# Patient Record
Sex: Female | Born: 1960
Health system: Southern US, Community
[De-identification: ages and names within clinical notes are randomized; demographics above are authoritative.]

## PROBLEM LIST (undated history)

## (undated) DIAGNOSIS — G4730 Sleep apnea, unspecified: Secondary | ICD-10-CM

## (undated) DIAGNOSIS — M545 Low back pain, unspecified: Secondary | ICD-10-CM

## (undated) DIAGNOSIS — E785 Hyperlipidemia, unspecified: Secondary | ICD-10-CM

## (undated) DIAGNOSIS — J45909 Unspecified asthma, uncomplicated: Secondary | ICD-10-CM

## (undated) DIAGNOSIS — R519 Headache, unspecified: Secondary | ICD-10-CM

## (undated) DIAGNOSIS — R011 Cardiac murmur, unspecified: Secondary | ICD-10-CM

## (undated) DIAGNOSIS — R51 Headache: Secondary | ICD-10-CM

## (undated) DIAGNOSIS — R0683 Snoring: Secondary | ICD-10-CM

## (undated) DIAGNOSIS — D509 Iron deficiency anemia, unspecified: Secondary | ICD-10-CM

## (undated) DIAGNOSIS — F419 Anxiety disorder, unspecified: Secondary | ICD-10-CM

## (undated) DIAGNOSIS — I341 Nonrheumatic mitral (valve) prolapse: Secondary | ICD-10-CM

## (undated) HISTORY — DX: Unspecified asthma, uncomplicated: J45.909

## (undated) HISTORY — DX: Low back pain: M54.5

## (undated) HISTORY — DX: Snoring: R06.83

## (undated) HISTORY — DX: Low back pain, unspecified: M54.50

## (undated) HISTORY — DX: Iron deficiency anemia, unspecified: D50.9

## (undated) HISTORY — DX: Headache, unspecified: R51.9

## (undated) HISTORY — DX: Headache: R51

## (undated) HISTORY — DX: Anxiety disorder, unspecified: F41.9

## (undated) HISTORY — DX: Nonrheumatic mitral (valve) prolapse: I34.1

## (undated) HISTORY — DX: Hyperlipidemia, unspecified: E78.5

---

## 1994-05-30 HISTORY — PX: TUBAL LIGATION: SHX77

## 1998-12-27 ENCOUNTER — Emergency Department (HOSPITAL_COMMUNITY): Admission: EM | Admit: 1998-12-27 | Discharge: 1998-12-27 | Payer: Self-pay | Admitting: *Deleted

## 1998-12-27 ENCOUNTER — Encounter: Payer: Self-pay | Admitting: *Deleted

## 2000-09-02 ENCOUNTER — Encounter: Payer: Self-pay | Admitting: Obstetrics and Gynecology

## 2000-09-02 ENCOUNTER — Encounter: Admission: RE | Admit: 2000-09-02 | Discharge: 2000-09-02 | Payer: Self-pay | Admitting: Obstetrics and Gynecology

## 2000-09-05 ENCOUNTER — Encounter: Payer: Self-pay | Admitting: Obstetrics and Gynecology

## 2000-09-05 ENCOUNTER — Encounter: Admission: RE | Admit: 2000-09-05 | Discharge: 2000-09-05 | Payer: Self-pay | Admitting: Obstetrics and Gynecology

## 2002-06-17 HISTORY — PX: LUMBAR DISC SURGERY: SHX700

## 2002-07-01 ENCOUNTER — Ambulatory Visit (HOSPITAL_BASED_OUTPATIENT_CLINIC_OR_DEPARTMENT_OTHER): Admission: RE | Admit: 2002-07-01 | Discharge: 2002-07-01 | Payer: Self-pay | Admitting: Obstetrics and Gynecology

## 2002-12-01 ENCOUNTER — Encounter: Payer: Self-pay | Admitting: Neurosurgery

## 2002-12-02 ENCOUNTER — Inpatient Hospital Stay (HOSPITAL_COMMUNITY): Admission: RE | Admit: 2002-12-02 | Discharge: 2002-12-02 | Payer: Self-pay | Admitting: Neurosurgery

## 2003-03-22 ENCOUNTER — Encounter: Payer: Self-pay | Admitting: Obstetrics and Gynecology

## 2003-03-22 ENCOUNTER — Encounter: Admission: RE | Admit: 2003-03-22 | Discharge: 2003-03-22 | Payer: Self-pay | Admitting: Obstetrics and Gynecology

## 2004-05-02 ENCOUNTER — Encounter: Admission: RE | Admit: 2004-05-02 | Discharge: 2004-05-02 | Payer: Self-pay | Admitting: Obstetrics and Gynecology

## 2004-07-09 ENCOUNTER — Ambulatory Visit: Payer: Self-pay | Admitting: Pulmonary Disease

## 2004-07-17 ENCOUNTER — Ambulatory Visit: Payer: Self-pay | Admitting: Pulmonary Disease

## 2004-08-13 ENCOUNTER — Ambulatory Visit: Payer: Self-pay | Admitting: Pulmonary Disease

## 2004-10-11 ENCOUNTER — Emergency Department (HOSPITAL_COMMUNITY): Admission: EM | Admit: 2004-10-11 | Discharge: 2004-10-11 | Payer: Self-pay | Admitting: Emergency Medicine

## 2005-01-30 ENCOUNTER — Ambulatory Visit: Payer: Self-pay | Admitting: Internal Medicine

## 2005-06-06 ENCOUNTER — Encounter: Admission: RE | Admit: 2005-06-06 | Discharge: 2005-06-06 | Payer: Self-pay | Admitting: Obstetrics and Gynecology

## 2005-07-30 ENCOUNTER — Ambulatory Visit (HOSPITAL_COMMUNITY): Admission: RE | Admit: 2005-07-30 | Discharge: 2005-07-30 | Payer: Self-pay | Admitting: Pulmonary Disease

## 2005-07-30 ENCOUNTER — Ambulatory Visit: Payer: Self-pay | Admitting: Pulmonary Disease

## 2006-04-14 ENCOUNTER — Ambulatory Visit: Payer: Self-pay | Admitting: Pulmonary Disease

## 2006-05-16 ENCOUNTER — Ambulatory Visit: Payer: Self-pay | Admitting: Pulmonary Disease

## 2006-07-15 ENCOUNTER — Encounter: Admission: RE | Admit: 2006-07-15 | Discharge: 2006-07-15 | Payer: Self-pay | Admitting: Obstetrics and Gynecology

## 2007-05-25 ENCOUNTER — Telehealth (INDEPENDENT_AMBULATORY_CARE_PROVIDER_SITE_OTHER): Payer: Self-pay | Admitting: *Deleted

## 2007-06-24 DIAGNOSIS — M545 Low back pain: Secondary | ICD-10-CM

## 2007-06-24 DIAGNOSIS — E785 Hyperlipidemia, unspecified: Secondary | ICD-10-CM

## 2007-06-24 DIAGNOSIS — I059 Rheumatic mitral valve disease, unspecified: Secondary | ICD-10-CM | POA: Insufficient documentation

## 2007-06-24 DIAGNOSIS — F411 Generalized anxiety disorder: Secondary | ICD-10-CM

## 2007-06-25 ENCOUNTER — Ambulatory Visit: Payer: Self-pay | Admitting: Pulmonary Disease

## 2007-06-25 DIAGNOSIS — R0609 Other forms of dyspnea: Secondary | ICD-10-CM

## 2007-06-25 DIAGNOSIS — H9190 Unspecified hearing loss, unspecified ear: Secondary | ICD-10-CM | POA: Insufficient documentation

## 2007-06-25 DIAGNOSIS — Z862 Personal history of diseases of the blood and blood-forming organs and certain disorders involving the immune mechanism: Secondary | ICD-10-CM | POA: Insufficient documentation

## 2007-06-25 DIAGNOSIS — R0989 Other specified symptoms and signs involving the circulatory and respiratory systems: Secondary | ICD-10-CM

## 2007-06-25 DIAGNOSIS — J309 Allergic rhinitis, unspecified: Secondary | ICD-10-CM | POA: Insufficient documentation

## 2007-06-29 ENCOUNTER — Telehealth (INDEPENDENT_AMBULATORY_CARE_PROVIDER_SITE_OTHER): Payer: Self-pay | Admitting: *Deleted

## 2007-08-06 ENCOUNTER — Encounter: Payer: Self-pay | Admitting: Pulmonary Disease

## 2008-01-08 ENCOUNTER — Ambulatory Visit: Payer: Self-pay | Admitting: Pulmonary Disease

## 2008-01-14 ENCOUNTER — Telehealth: Payer: Self-pay | Admitting: Pulmonary Disease

## 2008-01-15 LAB — CONVERTED CEMR LAB
ALT: 16 units/L (ref 0–35)
AST: 19 units/L (ref 0–37)
Albumin: 4.2 g/dL (ref 3.5–5.2)
Alkaline Phosphatase: 54 units/L (ref 39–117)
Basophils Relative: 1.4 % (ref 0.0–3.0)
Bilirubin, Direct: 0.1 mg/dL (ref 0.0–0.3)
CO2: 28 meq/L (ref 19–32)
Chloride: 105 meq/L (ref 96–112)
Cholesterol: 163 mg/dL (ref 0–200)
Creatinine, Ser: 0.8 mg/dL (ref 0.4–1.2)
Direct LDL: 98.5 mg/dL
GFR calc Af Amer: 99 mL/min
Glucose, Bld: 93 mg/dL (ref 70–99)
HCT: 41.1 % (ref 36.0–46.0)
HDL: 39.6 mg/dL (ref >39.0)
Hemoglobin: 14.2 g/dL (ref 12.0–15.0)
Lymphocytes Relative: 29.9 % (ref 12.0–46.0)
MCHC: 34.5 g/dL (ref 30.0–36.0)
MCV: 93 fL (ref 78.0–100.0)
Monocytes Absolute: 0.3 10*3/uL (ref 0.1–1.0)
Monocytes Relative: 5.7 % (ref 3.0–12.0)
Neutrophils Relative %: 59.4 % (ref 43.0–77.0)
Platelets: 331 10*3/uL (ref 150–400)
RBC: 4.42 M/uL (ref 3.87–5.11)
RDW: 11.7 % (ref 11.5–14.6)
Total CHOL/HDL Ratio: 4.1
Total Protein: 7 g/dL (ref 6.0–8.3)
Triglycerides: 211 mg/dL (ref 0–149)
WBC: 5.7 10*3/uL (ref 4.5–10.5)

## 2008-03-01 ENCOUNTER — Telehealth (INDEPENDENT_AMBULATORY_CARE_PROVIDER_SITE_OTHER): Payer: Self-pay | Admitting: *Deleted

## 2008-03-25 ENCOUNTER — Encounter: Admission: RE | Admit: 2008-03-25 | Discharge: 2008-03-25 | Payer: Self-pay | Admitting: Obstetrics and Gynecology

## 2008-03-31 ENCOUNTER — Ambulatory Visit: Payer: Self-pay | Admitting: Pulmonary Disease

## 2008-04-25 ENCOUNTER — Telehealth (INDEPENDENT_AMBULATORY_CARE_PROVIDER_SITE_OTHER): Payer: Self-pay | Admitting: *Deleted

## 2009-05-19 ENCOUNTER — Ambulatory Visit: Payer: Self-pay | Admitting: Pulmonary Disease

## 2009-05-20 LAB — CONVERTED CEMR LAB
ALT: 21 units/L (ref 0–35)
AST: 21 units/L (ref 0–37)
BUN: 7 mg/dL (ref 6–23)
Basophils Absolute: 0.1 10*3/uL (ref 0.0–0.1)
Cholesterol: 145 mg/dL (ref 0–200)
Creatinine, Ser: 0.8 mg/dL (ref 0.4–1.2)
GFR calc non Af Amer: 81.03 mL/min (ref >60)
HCT: 42.8 % (ref 36.0–46.0)
LDL Cholesterol: 70 mg/dL (ref 0–99)
Lymphocytes Relative: 23.3 % (ref 12.0–46.0)
MCHC: 34.1 g/dL (ref 30.0–36.0)
MCV: 96.1 fL (ref 78.0–100.0)
Monocytes Absolute: 0.4 10*3/uL (ref 0.1–1.0)
Neutro Abs: 5.3 10*3/uL (ref 1.4–7.7)
Platelets: 326 10*3/uL (ref 150.0–400.0)
Potassium: 4.2 meq/L (ref 3.5–5.1)
RDW: 11.2 % — ABNORMAL LOW (ref 11.5–14.6)
Sodium: 141 meq/L (ref 135–145)
Specific Gravity, Urine: 1.01 (ref 1.000–1.030)
TSH: 3.17 microintl units/mL (ref 0.35–5.50)
Total Bilirubin: 0.6 mg/dL (ref 0.3–1.2)
Total Protein: 6.9 g/dL (ref 6.0–8.3)
Urine Glucose: NEGATIVE mg/dL
Urobilinogen, UA: 0.2 (ref 0.0–1.0)
VLDL: 31.8 mg/dL (ref 0.0–40.0)

## 2009-08-08 ENCOUNTER — Encounter: Admission: RE | Admit: 2009-08-08 | Discharge: 2009-08-08 | Payer: Self-pay | Admitting: Obstetrics and Gynecology

## 2009-08-22 ENCOUNTER — Ambulatory Visit (HOSPITAL_COMMUNITY): Admission: RE | Admit: 2009-08-22 | Discharge: 2009-08-22 | Payer: Self-pay | Admitting: Obstetrics and Gynecology

## 2009-09-12 ENCOUNTER — Telehealth (INDEPENDENT_AMBULATORY_CARE_PROVIDER_SITE_OTHER): Payer: Self-pay | Admitting: *Deleted

## 2009-10-30 ENCOUNTER — Ambulatory Visit: Payer: Self-pay | Admitting: Pulmonary Disease

## 2009-10-30 DIAGNOSIS — J069 Acute upper respiratory infection, unspecified: Secondary | ICD-10-CM | POA: Insufficient documentation

## 2009-11-01 ENCOUNTER — Telehealth (INDEPENDENT_AMBULATORY_CARE_PROVIDER_SITE_OTHER): Payer: Self-pay | Admitting: *Deleted

## 2010-07-15 LAB — CONVERTED CEMR LAB
ALT: 21 units/L (ref 0–35)
AST: 22 units/L (ref 0–37)
Albumin: 4.2 g/dL (ref 3.5–5.2)
BUN: 11 mg/dL (ref 6–23)
Basophils Absolute: 0 10*3/uL (ref 0.0–0.1)
Basophils Relative: 0.1 % (ref 0.0–1.0)
Bilirubin Urine: NEGATIVE
CO2: 28 meq/L (ref 19–32)
Chloride: 100 meq/L (ref 96–112)
Creatinine, Ser: 0.8 mg/dL (ref 0.4–1.2)
Eosinophils Absolute: 0.3 10*3/uL (ref 0.0–0.6)
Eosinophils Relative: 3.5 % (ref 0.0–5.0)
GFR calc Af Amer: 99 mL/min
Hemoglobin: 14.4 g/dL (ref 12.0–15.0)
Leukocytes, UA: NEGATIVE
Monocytes Absolute: 0.3 10*3/uL (ref 0.2–0.7)
Monocytes Relative: 3.3 % (ref 3.0–11.0)
Mucus, UA: NEGATIVE
Neutro Abs: 5.4 10*3/uL (ref 1.4–7.7)
Neutrophils Relative %: 65.8 % (ref 43.0–77.0)
Nitrite: NEGATIVE
Platelets: 305 10*3/uL (ref 150–400)
RBC: 4.48 M/uL (ref 3.87–5.11)
RDW: 11.5 % (ref 11.5–14.6)
Total Bilirubin: 0.8 mg/dL (ref 0.3–1.2)
Total Protein, Urine: NEGATIVE mg/dL
VLDL: 42 mg/dL — ABNORMAL HIGH (ref 0–40)
WBC: 8.2 10*3/uL (ref 4.5–10.5)

## 2010-07-17 NOTE — Progress Notes (Signed)
Summary: talk to dr Kriste Basque  Phone Note Call from Patient Call back at [phone]   Caller: Patient Call For: nadel Summary of Call: pt want to know if Dr Kriste Basque will be primary for her 50 year old son Initial call taken by: Rickard Patience,  September 12, 2009 11:16 AM  Follow-up for Phone Call        please advise. thanks.  Aundra Millet Reynolds LPN  September 12, 2009 11:22 AM    per SN---he will be primary care for this pt---they can schedule an appt for next aval.  thanks Randell Loop CMA  September 12, 2009 11:48 AM     Additional Follow-up for Phone Call Additional follow up Details #1::        Mrs Keeling contacted - Schedulled pt for 11/29/2009 @ 11:00am. Additional Follow-up by: Eugene Gavia,  September 14, 2009 1:24 PM

## 2010-07-17 NOTE — Progress Notes (Signed)
Summary: allegic to codeine  Phone Note Call from Patient   Caller: Patient Call For: tammy parrett Summary of Call: pt vomited twice last night. says she is allergic to codeine. pt has been taking hydromet since monday night. please advise. pt# [phone]   Initial call taken by: Tivis Ringer, CNA,  Nov 01, 2009 3:00 PM  Follow-up for Phone Call        [email or web address] Vernie Murders  Nov 01, 2009 3:09 PM  Pt is having N & V taking Hydromet. Please advise if there is an alternative.Michel Bickers CMA  Nov 02, 2009 11:58 AM   Additional Follow-up for Phone Call Additional follow up Details #1::        hydromet does not have codeine in it is has hydrocodone.  would stop hydromet. Just use mucinex dm  as needed cough/congestion  Additional Follow-up by: Rubye Oaks NP,  Nov 02, 2009 12:16 PM   New Allergies: ! * CODEINE ! * HYDROMET Additional Follow-up for Phone Call Additional follow up Details #2::    pt returned call. call her at [phone]. Tivis Ringer, CNA  Nov 02, 2009 1:04 PM  The pt is awae to stop Hydromet and use Mucinex DM for her cough and congestion. I will update her Allergy list.Lori Excell Seltzer CMA  Nov 02, 2009 1:16 PM  New Allergies: ! * CODEINE ! * HYDROMET

## 2010-07-17 NOTE — Assessment & Plan Note (Signed)
Summary: Acute NP office visit - bronchitis   CC:  increased SOB, wheezing/rattling in chest, prod cough with yellow/white mucus, and f/c/s x5days.  History of Present Illness: 50 year old female with known history of Allergic Rhintis    ~  May 19, 2009:  Brianna Carroll's had a good year- states that Brianna Carroll is resting well, not snoring as much & husb hasn't complained, resting well, & energy good... Brianna Carroll is inquiring about a rec for a plastic surgeon to discuss reduction surg & we gave her DrBarber & DrHolderness numbers...  Oct 30, 2009 --Presents for an acute office visit. Complains of increased SOB, wheezing/rattling in chest, prod cough with yellow/white mucus, f/c/s 1 week. Cough is getting worse. Brianna Carroll feels tired. Using multiple otc cold meds w/ no help. Denies chest pain, dyspnea, orthopnea, hemoptysis, fever, n/v/d, edema, headache.      Medications Prior to Update: 1)  Zyrtec Allergy 10 Mg Tabs (Cetirizine Hcl) 2)  Simvastatin 40 Mg  Tabs (Simvastatin) .Marland Kitchen.. 1 Tab By Mouth At Bedtime 3)  Alprazolam 0.5 Mg Tabs (Alprazolam) .... 1/2 To 1 Tab By Mouth Three Times A Day As Needed For Anxiety  Current Medications (verified): 1)  Simvastatin 40 Mg  Tabs (Simvastatin) .Marland Kitchen.. 1 Tab By Mouth At Bedtime 2)  Alprazolam 0.5 Mg Tabs (Alprazolam) .... 1/2 To 1 Tab By Mouth Three Times A Day As Needed For Anxiety 3)  Birth Control .... Take 1 Tablet By Mouth Once A Day  Allergies (verified): 1)  ! Doxycycline 2)  ! 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  Hx of HEARING LOSS (ICD-389.9) - prev eval by [facility] ENT w/ mild sensorineural hearing loss, likely genetic and amplification recommended...  Hx of SNORING (ICD-786.09) - eval 11/06 by Gayla Medicus for snoring & prob OSA, but pt never had sleep study due to the cost... we revisitied this 1/09 but Brianna Carroll again cancelled the sleep study due to cost...   Hx of MITRAL VALVE PROLAPSE (ICD-424.0) - ? MVP (never had Echo)... Brianna Carroll denies symptoms except for occas palpit...  HYPERLIPIDEMIA (ICD-272.4) - Chol as high as 263 & LDL up to 185 in past...  ~  FLP 11/07 on Vytor10-20 showed TChol 141, TG 124, HDL, 44, LDL 72  ~  FLP 1/09 on Vytor10-20 showed TChol 216, TG 210, HDL 41, LDL 133... rec change to Simva40.  ~  FLP 7/09 on Simva40 showed TChol 163, TG 211, HDL 40, LDL 99  ~  FLP 12/10 on Simva40 showed TChol 145, TG 159, HDL 43, LDL 70  LOW BACK PAIN SYNDROME (ICD-724.2) - hx LBP eval by DrYates in past; second opinion DrNudelman 1999 w/ dx DDD & rx conservatively; eventually needed LLam & microdiscectomy 6/04...  ANXIETY (ICD-300.00) - on ALPRAZOLAM 0.5mg  Prn...  ANEMIA, IRON DEFICIENCY, HX OF (ICD-V12.3) - hx heavy menses and followed by DrMcPhail... prev Rx w/ oral iron supplements, and takes [facility] for menstrual symptoms.  ~  labs 1/09 showed Hg= 14.4  ~  labs 12/10 showed Hg= 14.6  Review of Systems      See HPI  Vital Signs:  Patient profile:   50 year old female Height:      61 inches Weight:      158 pounds BMI:     29.96 O2 Sat:      99 % on Room air Temp:     97.1 degrees F oral Pulse rate:   64 / minute BP sitting:   126 / 76  (left arm) Cuff size:   regular  Vitals Entered By: Boone Master CNA (Oct 30, 2009 3:28 PM)  O2 Flow:  Room air CC: increased SOB, wheezing/rattling in chest, prod cough with yellow/white mucus, f/c/s x5days Is Patient Diabetic? No Comments Medications reviewed with patient Daytime contact number  verified with patient. Boone Master CNA  Oct 30, 2009 3:33 PM     Impression & Recommendations:  Problem # 1:  ALLERGIC RHINITIS (ICD-477.9) Omnicef 300mg  two times a day for 7 days  Mucinex DM two times a day as needed cough/congestion  Increase fluids.  Hydromet 1-2 tsp every 4-6 hrs as needed cough, may make you sleepy.  Please contact office for sooner follow up if symptoms do not improve or worsen  The following medications were removed from the medication list:    Zyrtec Allergy 10 Mg Tabs (Cetirizine hcl)  Medications Added to Medication List This Visit: 1)  Birth Control  .... Take 1 tablet by mouth once a day 2)  Cefdinir 300 Mg Caps (Cefdinir) .Marland Kitchen.. 1 by mouth two times a day 3)  Hydromet 5-1.5 Mg/31ml Syrp (Hydrocodone-homatropine) .Marland Kitchen.. 1-2 tsp every 4-6 hr as needed cough  Complete Medication List: 1)  Simvastatin 40 Mg Tabs (Simvastatin) .Marland Kitchen.. 1 tab by mouth at bedtime 2)  Alprazolam 0.5 Mg Tabs (Alprazolam) .... 1/2 to 1 tab by mouth three times a day as needed for anxiety 3)  Birth Control  .... Take 1 tablet by mouth once a day 4)  Cefdinir 300 Mg Caps (Cefdinir) .Marland Kitchen.. 1 by mouth two times a day 5)  Hydromet 5-1.5 Mg/76ml Syrp (Hydrocodone-homatropine) .Marland Kitchen.. 1-2 tsp every 4-6 hr as needed cough  Other Orders: Est. Patient Level III (16109)  Patient Instructions: 1)  Omnicef 300mg  two times a day for 7 days  2)  Mucinex DM two times a day as needed cough/congestion  3)  Increase fluids.  4)  Hydromet 1-2 tsp every 4-6 hrs as needed cough, may make you sleepy.  5)  Please contact office for sooner follow up if symptoms do not improve or worsen  Prescriptions: HYDROMET 5-1.5 MG/5ML SYRP (HYDROCODONE-HOMATROPINE) 1-2 tsp every 4-6 hr as needed cough  #8 oz x 0   Entered and Authorized by:   Rubye Oaks NP   Signed by:   Gustavo Dispenza NP on 10/30/2009   Method used:   Print then Give to Patient   RxID:   [id number] CEFDINIR 300 MG CAPS (CEFDINIR) 1 by mouth  two times a day  #14 x 0   Entered and Authorized by:   Rubye Oaks NP   Signed by:   Baneza Bartoszek NP on 10/30/2009   Method used:   Print then Give to Patient   RxID:   [id number]   Appended Document: Orders Update - neb tx    Clinical Lists Changes  Orders: Added new Service order of Nebulizer Tx ([id number]) - Signed

## 2010-08-15 ENCOUNTER — Telehealth: Payer: Self-pay | Admitting: Pulmonary Disease

## 2010-08-16 ENCOUNTER — Other Ambulatory Visit: Payer: Self-pay | Admitting: Obstetrics & Gynecology

## 2010-08-16 DIAGNOSIS — Z1231 Encounter for screening mammogram for malignant neoplasm of breast: Secondary | ICD-10-CM

## 2010-08-23 NOTE — Progress Notes (Signed)
Summary: labs  Phone Note Call from Patient   Caller: Patient Call For: nadel Summary of Call: pt has appt on 3/13. wants labs set up. pt # [id number] Initial call taken by: Tivis Ringer, CNA,  August 15, 2010 11:34 AM  Follow-up for Phone Call        please advise on what labs to order. thanks. Carron Curie CMA  August 15, 2010 12:29 PM   per SN---ok for pt to have labs done prior to ov---lip-cbcd-bmp-hepat-tsh--v70.0----labs are in the computer for 3-12 and pt is aware of appt Randell Loop [facility]  August 15, 2010 2:38 PM

## 2010-08-27 ENCOUNTER — Other Ambulatory Visit: Payer: Self-pay | Admitting: Pulmonary Disease

## 2010-08-27 ENCOUNTER — Other Ambulatory Visit: Payer: BC Managed Care – PPO

## 2010-08-27 ENCOUNTER — Encounter (INDEPENDENT_AMBULATORY_CARE_PROVIDER_SITE_OTHER): Payer: Self-pay | Admitting: *Deleted

## 2010-08-27 DIAGNOSIS — Z0000 Encounter for general adult medical examination without abnormal findings: Secondary | ICD-10-CM

## 2010-08-27 DIAGNOSIS — E785 Hyperlipidemia, unspecified: Secondary | ICD-10-CM

## 2010-08-27 LAB — CBC WITH DIFFERENTIAL/PLATELET
Basophils Absolute: 0 10*3/uL (ref 0.0–0.1)
Hemoglobin: 14.2 g/dL (ref 12.0–15.0)
Lymphocytes Relative: 40.3 % (ref 12.0–46.0)
Lymphs Abs: 2.3 10*3/uL (ref 0.7–4.0)
Monocytes Relative: 6.9 % (ref 3.0–12.0)
Neutro Abs: 2.7 10*3/uL (ref 1.4–7.7)
Neutrophils Relative %: 48 % (ref 43.0–77.0)
RBC: 4.49 Mil/uL (ref 3.87–5.11)
RDW: 13 % (ref 11.5–14.6)
WBC: 5.7 10*3/uL (ref 4.5–10.5)

## 2010-08-27 LAB — HEPATIC FUNCTION PANEL
Alkaline Phosphatase: 48 U/L (ref 39–117)
Bilirubin, Direct: 0.1 mg/dL (ref 0.0–0.3)
Total Bilirubin: 0.5 mg/dL (ref 0.3–1.2)
Total Protein: 6.5 g/dL (ref 6.0–8.3)

## 2010-08-27 LAB — LDL CHOLESTEROL, DIRECT: Direct LDL: 132.7 mg/dL

## 2010-08-27 LAB — LIPID PANEL
Cholesterol: 220 mg/dL — ABNORMAL HIGH (ref 0–200)
HDL: 51.7 mg/dL (ref >39.00)
Total CHOL/HDL Ratio: 4
Triglycerides: 327 mg/dL — ABNORMAL HIGH (ref 0.0–149.0)

## 2010-08-27 LAB — BASIC METABOLIC PANEL
CO2: 27 mEq/L (ref 19–32)
Creatinine, Ser: 0.6 mg/dL (ref 0.4–1.2)
GFR: 106.2 mL/min (ref >60.00)
Sodium: 137 mEq/L (ref 135–145)

## 2010-08-27 LAB — TSH: TSH: 3.73 u[IU]/mL (ref 0.35–5.50)

## 2010-08-28 ENCOUNTER — Encounter: Payer: Self-pay | Admitting: Pulmonary Disease

## 2010-08-28 ENCOUNTER — Ambulatory Visit (INDEPENDENT_AMBULATORY_CARE_PROVIDER_SITE_OTHER)
Admission: RE | Admit: 2010-08-28 | Discharge: 2010-08-28 | Disposition: A | Discharge status: Home / Self Care | Payer: BC Managed Care – PPO | Source: Ambulatory Visit | Attending: Pulmonary Disease | Admitting: Pulmonary Disease

## 2010-08-28 ENCOUNTER — Ambulatory Visit: Payer: Self-pay

## 2010-08-28 ENCOUNTER — Encounter (INDEPENDENT_AMBULATORY_CARE_PROVIDER_SITE_OTHER): Payer: BC Managed Care – PPO | Admitting: Pulmonary Disease

## 2010-08-28 ENCOUNTER — Other Ambulatory Visit: Payer: Self-pay | Admitting: Pulmonary Disease

## 2010-08-28 ENCOUNTER — Ambulatory Visit
Admission: RE | Admit: 2010-08-28 | Discharge: 2010-08-28 | Disposition: A | Discharge status: Home / Self Care | Payer: BC Managed Care – PPO | Source: Ambulatory Visit | Attending: Obstetrics & Gynecology | Admitting: Obstetrics & Gynecology

## 2010-08-28 DIAGNOSIS — Z0000 Encounter for general adult medical examination without abnormal findings: Secondary | ICD-10-CM

## 2010-08-28 DIAGNOSIS — Z1231 Encounter for screening mammogram for malignant neoplasm of breast: Secondary | ICD-10-CM

## 2010-09-18 NOTE — Assessment & Plan Note (Signed)
Summary: ANNUAL//SH   CC:  Yearly ROV & CPX....  History of Present Illness: 50 y/o WF here for a follow up visit & CPX...    ~  May 19, 2009:  she's had a good year- states that she is resting well, not snoring as much & husb hasn't complained, resting well, & energy good... she is inquiring about a rec for a plastic surgeon to discuss reduction surg & we gave her DrBarber & DrHolderness numbers...   ~  August 28, 2010:  Bernis has had a good yr>  notes some recent allergy symptoms but otherw stable... she has mild hearing loss, hx snoring but has declined sleep study, weight is up 8# & she is committed to better diet 7 exercise program... she needs a screening colon & will consult GI at her convenience...  Lipid Profile numbers are not at goal but she doesn't want meds & will work w/ her husb to get on the proper diet, incr exerc & get wt down...    Current Problems:   ALLERGIC RHINITIS (ICD-477.9) - hx +allergy testing to dust, uses OTC antihist etc...  Hx of HEARING LOSS (ICD-389.9) - prev eval by [facility] ENT w/ mild sensorineural hearing loss, likely genetic and amplification recommended...  Hx of SNORING (ICD-786.09) - eval 11/06 by Gayla Medicus for snoring & prob OSA, but pt never had sleep study due to the cost... we revisitied this 1/09 but she again cancelled the sleep study due to cost...  ~  12/10: she notes that she is snoring less, husb not complaining, resting well, & energy is good.  Hx of MITRAL VALVE PROLAPSE (ICD-424.0) - ? MVP (never had Echo)... she denies symptoms except for occas palpit...  HYPERLIPIDEMIA (ICD-272.4) - Chol as high as 263 & LDL up to 185 in past...  ~  FLP 11/07 on Vytor10-20 showed TChol 141, TG 124, HDL, 44, LDL 72  ~  FLP 1/09 on Vytor10-20 showed TChol 216, TG 210, HDL 41, LDL 133... rec change to Simva40.  ~  FLP 7/09 on Simva40 showed TChol 163, TG 211, HDL 40, LDL 99  ~  FLP 12/10 on Simva40 showed TChol 145, TG 159, HDL 43, LDL 70  LOW BACK  PAIN SYNDROME (ICD-724.2) - hx LBP eval by DrYates in past; second opinion DrNudelman 1999 w/ dx DDD & rx conservatively; eventually needed LLam & microdiscectomy 6/04...  ANXIETY (ICD-300.00) - on ALPRAZOLAM 0.5mg  Prn...  ANEMIA, IRON DEFICIENCY, HX OF (ICD-V12.3) - hx heavy menses and followed by DrMcPhail... prev Rx w/ oral iron supplements, and takes [facility] for menstrual symptoms.  ~  labs 1/09 showed Hg= 14.4  ~  labs 12/10 showed Hg= 14.6   Preventive Screening-Counseling & Management  Alcohol-Tobacco     Smoking Status: never  Allergies: 1)  ! Doxycycline 2)  ! Vicodin 3)  ! * Codeine 4)  ! * Hydromet  Comments:  Nurse/Medical Assistant: The patient's medications and allergies were reviewed with the patient and were updated in the Medication and Allergy Lists.  Past History:  Past Medical History: ALLERGIC RHINITIS (ICD-477.9) Hx of HEARING LOSS (ICD-389.9) Hx of SNORING (ICD-786.09) Hx of MITRAL VALVE PROLAPSE (ICD-424.0) HYPERLIPIDEMIA (ICD-272.4) LOW BACK PAIN SYNDROME (ICD-724.2) ANXIETY (ICD-300.00) ANEMIA, IRON DEFICIENCY, HX OF (ICD-V12.3)  Family History: Reviewed history from 06/25/2007 and no changes required. Father died age 50 from lung cancer Mother died age 31 from lung cancer Wyman Songster is her sister +Fam history of hearing loss in sibs  Social History: Reviewed  history from 06/25/2007 and no changes required. 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-smoker Social Etoh  Review of Systems  The patient denies fever, chills, sweats, anorexia, fatigue, weakness, malaise, weight loss, sleep disorder, blurring, diplopia, eye irritation, eye discharge, vision loss, eye pain, photophobia, earache, ear discharge, tinnitus, decreased hearing, nasal congestion, nosebleeds, sore throat, hoarseness, chest pain, palpitations, syncope, dyspnea on exertion, orthopnea, PND, peripheral edema, cough,  dyspnea at rest, excessive sputum, hemoptysis, wheezing, pleurisy, nausea, vomiting, diarrhea, constipation, change in bowel habits, abdominal pain, melena, hematochezia, jaundice, gas/bloating, indigestion/heartburn, dysphagia, odynophagia, dysuria, hematuria, urinary frequency, urinary hesitancy, nocturia, incontinence, back pain, joint pain, joint swelling, muscle cramps, muscle weakness, stiffness, arthritis, sciatica, restless legs, leg pain at night, leg pain with exertion, rash, itching, dryness, suspicious lesions, paralysis, paresthesias, seizures, tremors, vertigo, transient blindness, frequent falls, frequent headaches, difficulty walking, depression, anxiety, memory loss, confusion, cold intolerance, heat intolerance, polydipsia, polyphagia, polyuria, unusual weight change, abnormal bruising, bleeding, enlarged lymph nodes, urticaria, allergic rash, hay fever, and recurrent infections.    Vital Signs:  Patient profile:   50 year old female Height:      61 inches Weight:      166.25 pounds BMI:     31.53 O2 Sat:      98 % on Room air Temp:     98.1 degrees F oral Pulse rate:   77 / minute BP sitting:   122 / 84  (left arm) Cuff size:   regular  Vitals Entered By: Randell Loop CMA (August 28, 2010 4:11 PM)  O2 Sat at Rest %:  98 O2 Flow:  Room air CC: Yearly ROV & CPX... Is Patient Diabetic? No Pain Assessment Patient in pain? no      Comments meds updated today with pt   Physical Exam  Additional Exam:  WD, WN, 50 y/o WF in NAD... GENERAL:  Alert & oriented; pleasant & cooperative. HEENT:  /AT, EOM-full, PERRLA, Fundi-benign, EACs-clear, TMs-wnl, NOSE-clear, THROAT-clear & wnl. NECK:  Supple w/ full ROM; no JVD; normal carotid impulses w/o bruits; no thyromegaly or nodules palpated; no lymphadenopathy. CHEST:  Clear to P & A; without wheezes/ rales/ or rhonchi. HEART:  Regular Rhythm; without murmurs/ rubs/ or gallops. ABDOMEN:  Soft & nontender; normal bowel sounds;  no organomegaly or masses detected. EXT: without deformities or arthritic changes; no varicose veins/ venous insuffic/ or edema. NEURO:  CN's intact; motor testing normal; sensory testing normal; gait normal & balance OK. DERM:  No lesions noted; no rash etc...    Impression & Recommendations:  Problem # 1:  PHYSICAL EXAMINATION (ICD-V70.0)  Orders: 12 Lead EKG (12 Lead EKG) T-2 View CXR (71020TC) Fasting labs done 3/12 & reviewed w/ pt...  Problem # 2:  Hx of SNORING (ICD-786.09) she is committed to losing wt & doesn't want sleep study etc...  Problem # 3:  HYPERLIPIDEMIA (ICD-272.4) FLP prev looked  OK on the simva40 but recently no so much!  She will get on diet & get wt down otherw we will need to change meds... Her updated medication list for this problem includes:    Simvastatin 40 Mg Tabs (Simvastatin) .Marland Kitchen... 1 tab by mouth at bedtime  Problem # 4:  LOW BACK PAIN SYNDROME (ICD-724.2) She is s/p surg by drNudelman & remionded to incr exericse etc...  Problem # 5:  ANXIETY (ICD-300.00) She has Alpraz for as needed use... Her updated medication list for this problem  includes:    Alprazolam 0.5 Mg Tabs (Alprazolam) .Marland Kitchen... 1/2 to 1 tab by mouth three times a day as needed for anxiety  Problem # 6:  ALLERGIC RHINITIS (ICD-477.9) We reviewed the spring time allergy regimen... Her updated medication list for this problem includes:    Fluticasone Propionate 50 Mcg/act Susp (Fluticasone propionate) .Marland Kitchen... 2 sprays in each nostril at bedtime  Complete Medication List: 1)  Simvastatin 40 Mg Tabs (Simvastatin) .Marland Kitchen.. 1 tab by mouth at bedtime 2)  Alprazolam 0.5 Mg Tabs (Alprazolam) .... 1/2 to 1 tab by mouth three times a day as needed for anxiety 3)  Birth Control  .... Take 1 tablet by mouth once a day 4)  Fluticasone Propionate 50 Mcg/act Susp (Fluticasone propionate) .... 2 sprays in each nostril at bedtime  Patient Instructions: 1)  Today we updated your med list- see  below.... 2)  We refilled your meds for 2012... 3)  We discussed diet & exercise> YOU CAN DO IT, get motivated & exercise a little every day... 4)  For your ALLERGIES:  Use the OTC Antihistamine every AM (Allegra, Zyrtek, Claritin), use the MUCINEX 1-2 tabs twice daily w/ lots of water + a nasal saline mist every 1-2H during the day;  and the new Rx for FLONASE at bedtime.Marland KitchenMarland Kitchen 5)  Call for any problems.Marland Kitchen 6)  Please schedule a follow-up appointment in 1 year. Prescriptions: FLUTICASONE PROPIONATE 50 MCG/ACT SUSP (FLUTICASONE PROPIONATE) 2 sprays in each nostril at bedtime  #1 x prn   Entered and Authorized by:   Michele Mcalpine MD   Signed by:   Michele Mcalpine MD on 08/28/2010   Method used:   Print then Give to Patient   RxID:   [id number] ALPRAZOLAM 0.5 MG TABS (ALPRAZOLAM) 1/2 to 1 tab by mouth three times a day as needed for anxiety  #100 x 6   Entered and Authorized by:   Michele Mcalpine MD   Signed by:   Michele Mcalpine MD on 08/28/2010   Method used:   Print then Give to Patient   RxID:   [id number] SIMVASTATIN 40 MG  TABS (SIMVASTATIN) 1 tab by mouth at bedtime  #90 x 4   Entered and Authorized by:   Michele Mcalpine MD   Signed by:   Michele Mcalpine MD on 08/28/2010   Method used:   Print then Give to Patient   RxID:   [id number]    Immunization History:  Influenza Immunization History:    Influenza:  historical (02/26/2010)

## 2010-11-02 NOTE — Op Note (Signed)
   NAME:  Brianna Carroll, Brianna Carroll                         ACCOUNT NO.:  [id number]   MEDICAL RECORD NO.:  [id number]                   PATIENT TYPE:  AMB   LOCATION:  [facility]                                 FACILITY:  [facility]   PHYSICIAN:  Katherine Roan, M.D.               DATE OF BIRTH:  08-11-1960   DATE OF PROCEDURE:  DATE OF DISCHARGE:                                 OPERATIVE REPORT   PREOPERATIVE DIAGNOSIS:  Desires sterilization.   POSTOPERATIVE DIAGNOSIS:  Desires sterilization.   OPERATION PERFORMED:  Laparoscopy with adhesiolysis and laparoscopic tubal  ligation.   PROCEDURE:  The patient was placed in the lithotomy position, examined under  anesthesia.  The uterus was anterior, slightly boggy and enlarged.  The  adnexa negative.  Rectovaginal confirmed.  The patient was then prepped and  draped.  The bladder was emptied.  A transverse incision was made in the  umbilicus.  The needle was inserted into the abdomen. The abdomen was  distended with 3 liters of carbon dioxide.  The trocar was then inserted  into the abdomen and released, and the visualization of the pelvis revealed  a small adhesion to the anterior abdominal wall.  The uterus, tubes, and  ovaries were normal.  There was no evidence of endometriosis.  Both tubes  were mobile, and both ovaries were mobile.  The tubes were then cauterized 2  cm lateral to the uterotubal junction and then divided with scissors.  The  adhesions in the upper abdomen were then lysed with sharp dissection.  I  used bipolar cautery to obtain hemostasis on the adhesion. No unusual blood  loss occurred.  The patient tolerated the procedure well and was sent to the  recovery room in good condition.                                               Katherine Roan, M.D.    SDM/MEDQ  D:  07/01/2002  T:  07/01/2002  Job:  [id number]

## 2010-11-02 NOTE — Op Note (Signed)
NAME:  Brianna Carroll, Brianna Carroll                         ACCOUNT NO.:  [id number]   MEDICAL RECORD NO.:  [id number]                   PATIENT TYPE:  OIB   LOCATION:  2899                                 FACILITY:  [facility]   PHYSICIAN:  Hewitt Shorts, M.D.            DATE OF BIRTH:  09-08-1960   DATE OF PROCEDURE:  12/01/2002  DATE OF DISCHARGE:                                 OPERATIVE REPORT   PREOPERATIVE DIAGNOSIS:  Right L4-5 lumbar disk herniation.   POSTOPERATIVE DIAGNOSIS:  Right L4-5 lumbar disk herniation.   OPERATION PERFORMED:  Right L4-5 lumbar laminotomy and microdiskectomy.   SURGEON:  Hewitt Shorts, M.D.   ASSISTANT:  Payton Doughty, M.D.   ANESTHESIA:  General endotracheal.   INDICATIONS FOR PROCEDURE:  The patient is a 50 year old woman who presented  with lumbar radiculopathy and was found to have a moderately large right L4-  5 lumbar disk herniation.  Decision was made to proceed with elective  laminotomy and microdiskectomy.   DESCRIPTION OF PROCEDURE:  The patient was brought to the operating room and  placed under general endotracheal anesthesia.  The patient was turned to a  prone position.  The lumbar region was prepped with DuraPrep and draped in a  sterile fashion.  The midline was infiltrated with local anesthetic with  epinephrine.  An x-ray was taken.  The L4-5 level identified and a midline  incision made and carried down through the subcutaneous tissue.  Bipolar  cautery and electrocautery were used to maintain hemostasis.  Dissection was  carried down to the lumbar fascia which was incised on the right side of  midline and the paraspinal muscles resected from the spinous process of the  lamina in subperiosteal fashion.  Initial localizing x-rays were taken, the  L4-5 interlaminar space identified.  The operating microscope was draped and  brought into the field to provide additional magnification, illumination and  visualization and the remainder  of the procedure was performed using  microdissection and microsurgical technique.  A laminotomy was performed  using the [facility] Max drill as well as Kerrison punches.  Edges of bone were  waxed as necessary.  The ligamentum flavum was carefully resected leaving  the underlying thecal sac undisturbed.  We identified the thecal sac and  right L5 nerve root and these structures were gently retracted medially  exposing the disk herniation.  There was a fragment of disk within the  spinal canal that was removed.  Then we further opened the annulus and  entered into the disk space.  We proceeded with a thorough diskectomy  removing all loose fragments of disk material from both the disk space and  the epidural space.  There was moderate spondylitic overgrowth going to the  posterior aspects of the L4 and L5 vertebrae which was removed using an  osteophyte removal tool.  Hemostasis was established using bipolar cautery  as well as Gelfoam  soaked in Thrombin.  All the Gelfoam, though, was removed  prior to completing the procedure.  Once the diskectomy was completed, we  did establish hemostasis and then instilled 2mL of fentanyl and 80 mg of  Depo-Medrol into the epidural space and then proceeded with closure.  The  deep fascia was closed with interrupted inverted undyed 1 Vicryl sutures,  the subcutaneous tissue was closed with interrupted inverted 1 undyed Vicryl  sutures and the subcutaneous and subcuticular layer were closed with  interrupted inverted 2-0 and 3-0 undyed Vicryl sutures and the skin edges  were  reapproximated with DermaBond.  The patient tolerated the procedure well.  The estimated blood loss was 50mL.  Sponge, needle and instrument counts  were correct.  Following surgery, the patient was to be turned back to the  supine position, reversed from anesthetic, extubated and transferred to the  recovery room for further care.                                                 Hewitt Shorts, M.D.    RWN/MEDQ  D:  12/01/2002  T:  12/01/2002  Job:  [id number]

## 2010-11-02 NOTE — Assessment & Plan Note (Signed)
[facility]                               PULMONARY OFFICE NOTE   NAME:CROMERSymphony, Brianna Carroll                      MRN:          [id number]  DATE:04/14/2006                            DOB:          25-Aug-1960    HISTORY OF PRESENT ILLNESS:  The patient is a 50 year old white female  patient of Dr. Jodelle Carroll who has a known history of hyperlipidemia, presents  for an acute office visit.  The patient complains that over the last several  weeks that she has noticed a nonpruritic rash underneath her breasts  bilaterally.  The patient denies any fever or redness.  The patient also  complains she has had some increased nasal congestion, postnasal drip, and  drainage.  The patient also had some intermittent episodes over the last  month of having some what she describes as small, painful cysts along her  eyelids.  Her last one was approximately a week ago, and she has used some  over-the-counter sty cream.  The patient denies any chest pain, shortness of  breath, nausea, vomiting, recent travel, antibiotic use.   PAST MEDICAL HISTORY:  Reviewed.   CURRENT MEDICATIONS:  Reviewed.   PHYSICAL EXAMINATION:  GENERAL:  The patient is a pleasant female in no  acute distress.  She is afebrile with stable vital signs.  HEENT:  Nasal mucosa is pale.  Nontender to sinus pressure.  Posterior  pharynx is clear.  TMs normal.  Conjunctivae are noninjected.  Upper and  lower eyelids are clear from redness or cysts or nodules.  NECK:  Supple without cervical adenopathy.  No JVD.  LUNG SOUNDS:  Clear to auscultation bilaterally.  CARDIAC:  Regular rate and rhythm.  ABDOMEN:  Soft without any hepatosplenomegaly.  EXTREMITIES:  Warm without any calf tenderness, cyanosis, clubbing or edema.  BREASTS:  Underneath the bilateral breasts is a small linear plaque of  hyperpigmentation.   IMPRESSION AND PLAN:  1. Allergic rhinitis.  The patient is to use Claritin D as needed along    with saline nasal spray.  The patient will follow up with Dr. Kriste Carroll in      1 month or sooner if needed.  2. Questionable eye discomfort.  I suspect that the patient may have had a      sty.  There is none noted at present time.  The patient is advised to      follow back up with [place] or her ophthalmologist at the onset if these      reoccur.  3. Cutaneous candidiasis underneath the breasts.  The patient is to use      Mycostatin cream twice daily x7 days.  Keep area dry for preventative      measures.  4. Hyperlipidemia.  The patient is due for fasting labs.  We will check a      fasting lipid panel and      a CMET.  The patient will continue on Vytorin along with a low-fat, low-      cholesterol diet and will recheck here in 4-6 weeks with Dr. Kriste Carroll for  a complete physical exam.     ______________________________  Brianna Oaks, NP    ______________________________  Brianna Carroll. Brianna Basque, MD   TP/MedQ  DD: 04/15/2006  DT: 04/15/2006  Job #: [id number]

## 2011-06-25 ENCOUNTER — Other Ambulatory Visit: Payer: Self-pay | Admitting: Pulmonary Disease

## 2011-06-29 ENCOUNTER — Other Ambulatory Visit: Payer: Self-pay | Admitting: Pulmonary Disease

## 2011-07-16 ENCOUNTER — Telehealth: Payer: Self-pay | Admitting: Pulmonary Disease

## 2011-07-16 ENCOUNTER — Ambulatory Visit (INDEPENDENT_AMBULATORY_CARE_PROVIDER_SITE_OTHER): Payer: BC Managed Care – PPO | Admitting: Adult Health

## 2011-07-16 ENCOUNTER — Encounter: Payer: Self-pay | Admitting: Adult Health

## 2011-07-16 VITALS — BP 130/100 | HR 72 | Temp 98.2°F | Ht 61.0 in | Wt 155.0 lb

## 2011-07-16 DIAGNOSIS — J329 Chronic sinusitis, unspecified: Secondary | ICD-10-CM

## 2011-07-16 DIAGNOSIS — J019 Acute sinusitis, unspecified: Secondary | ICD-10-CM | POA: Insufficient documentation

## 2011-07-16 MED ORDER — AMOXICILLIN-POT CLAVULANATE 875-125 MG PO TABS: 1.0000 | ORAL_TABLET | Freq: Two times a day (BID) | ORAL | Status: AC

## 2011-07-16 NOTE — Patient Instructions (Signed)
Augmentin 875mg  Twice daily  For 10 days -take with food Mucinex DM Twice daily  As needed  Cough/congestion  Saline nasal rinses As needed   Please contact office for sooner follow up if symptoms do not improve or worsen or seek emergency care  follow up Dr. Kriste Basque  In 2-3 months for physical.

## 2011-07-16 NOTE — Assessment & Plan Note (Signed)
Acute Sinusitis with Bronchitis  Plan;  Augmentin 875mg  Twice daily  For 10 days -take with food Mucinex DM Twice daily  As needed  Cough/congestion  Saline nasal rinses As needed   Please contact office for sooner follow up if symptoms do not improve or worsen or seek emergency care  follow up Dr. Kriste Basque  In 2-3 months for physical.

## 2011-07-16 NOTE — Progress Notes (Signed)
Subjective:    Patient ID: Brianna Carroll, female    DOB: 09-19-1960, 51 y.o.   MRN: [id number]  HPI 51 y/o WF   ~ May 19, 2009: she's had a good year- states that she is resting well, not snoring as much & husb hasn't complained, resting well, & energy good... she is inquiring about a rec for a plastic surgeon to discuss reduction surg & we gave her DrBarber & DrHolderness numbers...   ~ August 28, 2010: Brianna Carroll has had a good yr> notes some recent allergy symptoms but otherw stable... she has mild hearing loss, hx snoring but has declined sleep study, weight is up 8# & she is committed to better diet 7 exercise program... she needs a screening colon & will consult GI at her convenience... Lipid Profile numbers are not at goal but she doesn't want meds & will work w/ her husb to get on the proper diet, incr exerc & get wt down...   07/16/2011 Acute OV  Presents for 3 weeks of sinus congestion, cough, and drainage. Took Zpack 2 weeks ago with improvemtn. But symptoms came back last week. Husband has had same symptoms. Believes she re-caught it from him.  Has a lot of drainage . Cough is worse at night and early am. Mucus is very thick and sticky.  OTC not helping.,  No fever or chest pain.  No n/v. No recent travel.    PMH:   ALLERGIC RHINITIS (ICD-477.9) - hx +allergy testing to dust, uses OTC antihist etc...   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   Hx of SNORING (ICD-786.09) - eval 11/06 by Gayla Medicus for snoring & prob OSA, but pt never had sleep study due to the cost... we revisitied this 1/09 but she again cancelled the sleep study due to cost...  ~ 12/10: she notes that she is snoring less, husb not complaining, resting well, & energy is good.   Hx of MITRAL VALVE PROLAPSE (ICD-424.0) - ? MVP (never had Echo)... she denies symptoms except for occas palpit...   HYPERLIPIDEMIA  (ICD-272.4) - Chol as high as 263 & LDL up to 185 in past...  ~ FLP 11/07 on Vytor10-20 showed TChol 141, TG 124, HDL, 44, LDL 72  ~ FLP 1/09 on Vytor10-20 showed TChol 216, TG 210, HDL 41, LDL 133... rec change to Simva40.  ~ FLP 7/09 on Simva40 showed TChol 163, TG 211, HDL 40, LDL 99  ~ FLP 12/10 on Simva40 showed TChol 145, TG 159, HDL 43, LDL 70  \ LOW BACK PAIN SYNDROME (ICD-724.2) - hx LBP eval by DrYates in past; second opinion DrNudelman 1999 w/ dx DDD & rx conservatively; eventually needed LLam & microdiscectomy 6/04...   ANXIETY (ICD-300.00) - on ALPRAZOLAM 0.5mg  Prn...   ANEMIA, IRON DEFICIENCY, HX OF (ICD-V12.3) - hx heavy menses and followed by DrMcPhail... prev Rx w/ oral iron supplements, and takes [facility] for menstrual symptoms.  ~ labs 1/09 showed Hg= 14.4  ~ labs 12/10 showed Hg= 14.6     Review of Systems Constitutional:   No  weight loss, night sweats,  Fevers, chills, fatigue, or  lassitude.  HEENT:   No headaches,  Difficulty swallowing,  Tooth/dental problems, or  Sore throat,                No sneezing, itching, ear ache,  +nasal congestion, post nasal drip,   CV:  No chest  pain,  Orthopnea, PND, swelling in lower extremities, anasarca, dizziness, palpitations, syncope.   GI  No heartburn, indigestion, abdominal pain, nausea, vomiting, diarrhea, change in bowel habits, loss of appetite, bloody stools.   Resp: No shortness of breath with exertion or at rest.    No coughing up of blood.    No chest wall deformity  Skin: no rash or lesions.  GU: no dysuria, change in color of urine, no urgency or frequency.  No flank pain, no hematuria   MS:  No joint pain or swelling.  No decreased range of motion.  No back pain.  Psych:  No change in mood or affect. No depression or anxiety.  No memory loss.         Objective:   Physical Exam GEN: A/Ox3; pleasant , NAD, well nourished   HEENT:  [place]/AT,  EACs-clear, TMs-wnl, NOSE-turbinate edema  THROAT-clear, no  lesions, no postnasal drip or exudate noted.   NECK:  Supple w/ fair ROM; no JVD; normal carotid impulses w/o bruits; no thyromegaly or nodules palpated; no lymphadenopathy.  RESP  Coarse BS  w/o, wheezes/ rales/ or rhonchi.no accessory muscle use, no dullness to percussion  CARD:  RRR, no m/r/g  , no peripheral edema, pulses intact, no cyanosis or clubbing.  GI:   Soft & nt; nml bowel sounds; no organomegaly or masses detected.  Musco: Warm bil, no deformities or joint swelling noted.   Neuro: alert, no focal deficits noted.    Skin: Warm, no lesions or rashes         Assessment & Plan:

## 2011-07-16 NOTE — Telephone Encounter (Signed)
Spoke with pt. She states that SN called her zpack in while on call approx 2 wks ago to help with cough/congestion. She states that this has helped, but now cough is back and worse than before- prod with yellow sputum. She states also feels that she may be wheezing- "hears rattle in chest". OV with TP at our [facility] this pm. Address and phone number to that office given to pt and she verbalized understanding.

## 2011-08-19 ENCOUNTER — Telehealth: Payer: Self-pay | Admitting: Pulmonary Disease

## 2011-08-19 DIAGNOSIS — E785 Hyperlipidemia, unspecified: Secondary | ICD-10-CM

## 2011-08-19 DIAGNOSIS — F411 Generalized anxiety disorder: Secondary | ICD-10-CM

## 2011-08-19 DIAGNOSIS — J329 Chronic sinusitis, unspecified: Secondary | ICD-10-CM

## 2011-08-19 DIAGNOSIS — Z862 Personal history of diseases of the blood and blood-forming organs and certain disorders involving the immune mechanism: Secondary | ICD-10-CM

## 2011-08-19 NOTE — Telephone Encounter (Signed)
LMOM for pt TCB 

## 2011-08-19 NOTE — Telephone Encounter (Signed)
Per SN:  Pt can come in on Thursday, March 21 at 2 pm.  Have her come in at 8:00am for fasting labs and return at 2 pm for CPX.  She will need to have fasting lipid panel, bmet, liver, cbc with diff, tsh, UA, vit D level, and CXR in the mornings.  An EKG will be done at 2 pm during CPX.

## 2011-08-19 NOTE — Telephone Encounter (Signed)
Per our records, pt is due for her CPX on or after 08/28/11. Pt states he new schedule is harder to work around and already has her OB/GYN appointment scheduled for 09/05/11 at 9am and wants to come in for labs around 8am and would liked to be worked in after GYN appointment in order to only take 1 sick day from work. However if this is not an option pt is requesting that she can at least come in for fasting labs 09/05/2011 and just return another day in the late PM with SN for CPX. Please advise, thank you.

## 2011-08-20 NOTE — Telephone Encounter (Signed)
Called, spoke with pt.  She would like to come in on Thursday, March 21 at 8am for CXR and fasting labs and come back at 2 pm for CPX with Dr. Kriste Basque.  Labs and CXR orders entered and CPX scheduled -- pt aware and voiced no further questions/concerns at this time.

## 2011-08-20 NOTE — Telephone Encounter (Signed)
Pt return call. Brianna Carroll  °

## 2011-09-05 ENCOUNTER — Ambulatory Visit: Payer: BC Managed Care – PPO | Admitting: Pulmonary Disease

## 2011-09-20 ENCOUNTER — Other Ambulatory Visit: Payer: Self-pay | Admitting: Obstetrics & Gynecology

## 2011-09-20 DIAGNOSIS — Z1231 Encounter for screening mammogram for malignant neoplasm of breast: Secondary | ICD-10-CM

## 2011-09-24 ENCOUNTER — Ambulatory Visit
Admission: RE | Admit: 2011-09-24 | Discharge: 2011-09-24 | Disposition: A | Discharge status: Home / Self Care | Payer: BC Managed Care – PPO | Source: Ambulatory Visit | Attending: Obstetrics & Gynecology | Admitting: Obstetrics & Gynecology

## 2011-09-24 DIAGNOSIS — Z1231 Encounter for screening mammogram for malignant neoplasm of breast: Secondary | ICD-10-CM

## 2011-09-24 LAB — HM MAMMOGRAPHY: HM Mammogram: NORMAL

## 2011-10-03 ENCOUNTER — Encounter: Payer: Self-pay | Admitting: Internal Medicine

## 2011-10-23 ENCOUNTER — Ambulatory Visit (INDEPENDENT_AMBULATORY_CARE_PROVIDER_SITE_OTHER)
Admission: RE | Admit: 2011-10-23 | Discharge: 2011-10-23 | Disposition: A | Discharge status: Home / Self Care | Payer: BC Managed Care – PPO | Source: Ambulatory Visit | Attending: Pulmonary Disease | Admitting: Pulmonary Disease

## 2011-10-23 ENCOUNTER — Ambulatory Visit (INDEPENDENT_AMBULATORY_CARE_PROVIDER_SITE_OTHER): Payer: BC Managed Care – PPO | Admitting: Pulmonary Disease

## 2011-10-23 ENCOUNTER — Encounter: Payer: Self-pay | Admitting: Pulmonary Disease

## 2011-10-23 ENCOUNTER — Other Ambulatory Visit (INDEPENDENT_AMBULATORY_CARE_PROVIDER_SITE_OTHER): Payer: BC Managed Care – PPO

## 2011-10-23 VITALS — BP 130/86 | HR 68 | Temp 97.0°F | Ht 61.0 in | Wt 157.2 lb

## 2011-10-23 DIAGNOSIS — Z0000 Encounter for general adult medical examination without abnormal findings: Secondary | ICD-10-CM

## 2011-10-23 DIAGNOSIS — M545 Low back pain: Secondary | ICD-10-CM

## 2011-10-23 DIAGNOSIS — J309 Allergic rhinitis, unspecified: Secondary | ICD-10-CM

## 2011-10-23 DIAGNOSIS — F411 Generalized anxiety disorder: Secondary | ICD-10-CM

## 2011-10-23 DIAGNOSIS — E785 Hyperlipidemia, unspecified: Secondary | ICD-10-CM

## 2011-10-23 DIAGNOSIS — H9190 Unspecified hearing loss, unspecified ear: Secondary | ICD-10-CM

## 2011-10-23 DIAGNOSIS — Z862 Personal history of diseases of the blood and blood-forming organs and certain disorders involving the immune mechanism: Secondary | ICD-10-CM

## 2011-10-23 LAB — LIPID PANEL
Cholesterol: 171 mg/dL (ref 0–200)
HDL: 51.7 mg/dL (ref >39.00)

## 2011-10-23 LAB — HEPATIC FUNCTION PANEL
ALT: 41 U/L — ABNORMAL HIGH (ref 0–35)
AST: 29 U/L (ref 0–37)
Albumin: 3.5 g/dL (ref 3.5–5.2)

## 2011-10-23 LAB — CBC WITH DIFFERENTIAL/PLATELET
Eosinophils Relative: 3.4 % (ref 0.0–5.0)
Lymphocytes Relative: 30.9 % (ref 12.0–46.0)
Monocytes Relative: 5.2 % (ref 3.0–12.0)
Neutrophils Relative %: 60 % (ref 43.0–77.0)
Platelets: 317 10*3/uL (ref 150.0–400.0)
WBC: 7.4 10*3/uL (ref 4.5–10.5)

## 2011-10-23 LAB — BASIC METABOLIC PANEL
BUN: 11 mg/dL (ref 6–23)
Chloride: 107 mEq/L (ref 96–112)
Creatinine, Ser: 0.9 mg/dL (ref 0.4–1.2)
Glucose, Bld: 76 mg/dL (ref 70–99)
Potassium: 4.2 mEq/L (ref 3.5–5.1)

## 2011-10-23 LAB — URINALYSIS, ROUTINE W REFLEX MICROSCOPIC
Ketones, ur: NEGATIVE
Nitrite: NEGATIVE
Specific Gravity, Urine: <=1.005 (ref 1.000–1.030)
Urobilinogen, UA: 0.2 (ref 0.0–1.0)

## 2011-10-23 LAB — TSH: TSH: 2.8 u[IU]/mL (ref 0.35–5.50)

## 2011-10-23 MED ORDER — SIMVASTATIN 40 MG PO TABS: 40.0000 mg | ORAL_TABLET | Freq: Every day | ORAL | Status: DC

## 2011-10-23 MED ORDER — ALPRAZOLAM 0.5 MG PO TABS: ORAL_TABLET | ORAL | Status: DC

## 2011-10-23 MED ORDER — TRAMADOL HCL 50 MG PO TABS: 50.0000 mg | ORAL_TABLET | Freq: Three times a day (TID) | ORAL | Status: AC | PRN

## 2011-10-23 MED ORDER — FLUTICASONE PROPIONATE 50 MCG/ACT NA SUSP: 2.0000 | Freq: Every day | NASAL | Status: DC

## 2011-10-23 NOTE — Progress Notes (Signed)
Subjective:     Patient ID: Brianna Carroll, female   DOB: Dec 05, 1960, 51 y.o.   MRN: [id number]  HPI 51 y/o WF here for a follow up visit & CPX...   ~  May 19, 2009:  she's had a good year- states that she is resting well, not snoring as much & husb hasn't complained, resting well, & energy good... she is inquiring about a rec for a plastic surgeon to discuss reduction surg & we gave her DrBarber & DrHolderness numbers...  ~  August 28, 2010:  Brianna Carroll has had a good yr>  notes some recent allergy symptoms but otherw stable... she has mild hearing loss, hx snoring but has declined sleep study, weight is up 8# & she is committed to better diet & exercise program... she needs a screening colon & will consult GI at her convenience...  Lipid Profile numbers are not at goal but she doesn't want meds & will work w/ her husb to get on the proper diet, incr exerc & get wt down...  ~  Oct 23, 2011:  16mo ROV & CPX> Brianna Carroll has changed jobs from [employer] at [employer] to a similar position in the [employer]- less politics, nepotism, stress;  We reviewed her Problems, Meds, XRays, & Labs> see below>>    She had f/u eval by her GYN, DrSMiller & is perimenopausal, now on Estrace & Provera along w/ some vaginal cream; they tried Fluoxitine but she stopped it due to vivid dreams... She has had some dysuria & UA shows prob UTI> we discussed treating w/ Cipro & if symptoms don't resolve she will need further eval...    She has had some LBP & has seen DrMurphy/Wainer- Tramadol helps & we will refill; discussed diet, exercises, heat, etc... CXR 5/13 showed normal heart size, clear lungs, NAD... LABS 5/13:  FLP- chol=ok TG=318 on diet alone;  Chems- wnl x SGPT=41;  CBC- wnl;  TSH=2.80;  UA- +wbc/bact & we will rx w/ Cipro..   Problem List:    << PROBLEM LIST UPDATED 10/23/11 >>  ALLERGIC RHINITIS (ICD-477.9) - hx +allergy testing to dust; uses OTC antihist & FLONASE prn... ~  She had sinusitis 1/13  treated by TP w/ Augmentin, Mucinex, Saline & resolved...  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  Hx of SNORING (ICD-786.09) - eval 11/06 by Gayla Medicus for snoring & prob OSA, but pt never had sleep study due to the cost... we revisitied this 1/09 but she again cancelled the sleep study due to cost... ~  12/10: she notes that she is snoring less, husb not complaining, resting well, & energy is good; we reviewed the imperitive of weight loss...  Hx of MITRAL VALVE PROLAPSE (ICD-424.0) - ? MVP (never had Echo)... she denies symptoms except for occas palpit> no CP, dizzy, syncope, SOB, edema, etc... ~  EKG 3/12 showed NSR, rate72, late transition, NAD...  HYPERLIPIDEMIA (ICD-272.4) - Chol as high as 263 & LDL up to 185 in past... ~  FLP 11/07 on Vytor10-20 showed TChol 141, TG 124, HDL, 44, LDL 72 ~  FLP 1/09 on Vytor10-20 showed TChol 216, TG 210, HDL 41, LDL 133... rec change to Simva40. ~  FLP 7/09 on Simva40 showed TChol 163, TG 211, HDL 40, LDL 99 ~  FLP 12/10 on Simva40 showed TChol 145, TG 159, HDL 43, LDL  70 ~  FLP 3/12 on Simva40 (wt=166#) showed TChol 220, TG 327, HDL 52, LDL 133... rec to take med daily + diet, exercise, wt loss... ~  FLP 5/13 on Simva40 (wt=157#) showed TChol 171, TGT 318, HDL 52, LDL 94... Improved Chol, needs better low fat/ get wt down...  GI >> she needs baseline Colonoscopy for screening & she will sched at her convenience...  GYN >> she prev saw DrMcPhail w/ heavy menses, Rx BCPs, Effexor, etc... She now sees DrSMiller & is perimenopausal> started Estrace & Provera, along w/ a vag cream; tried Fluoxitine but INTOL w/ vivid dreams & stopped... ~  Labs 5/13 w/ prob mild UTI & she was given CIPRO 250mg Bid to treat...  LOW BACK PAIN SYNDROME (ICD-724.2) - hx LBP eval by DrYates in past; second opinion DrNudelman 1999 w/ dx DDD & rx  conservatively; eventually needed LLam & microdiscectomy 6/04 by Lenon Oms. ~  5/13:  She reports some "back spasms" that improved after eval by DrMurphy/Wainer w/ TRAMADOL 50mg  Prn (we refilled Rx & discussed exercise program etc)...  ANXIETY (ICD-300.00) - on ALPRAZOLAM 0.5mg  Prn...  ANEMIA, IRON DEFICIENCY, HX OF (ICD-V12.3) - hx heavy menses and prev followed by DrMcPhail> prev Rx w/ oral iron supplements... ~  labs 1/09 showed Hg= 14.4 ~  labs 12/10 showed Hg= 14.6 ~  Labs 3/12 showed Hg= 14.2 ~  Labs 5/13 showed Hg= 14.5   No past surgical history on file.   Outpatient Encounter Prescriptions as of 10/23/2011  Medication Sig Dispense Refill  . ALPRAZolam (XANAX) 0.5 MG tablet TAKE ONE-HALF TO ONE TABLET BY MOUTH THREE TIMES DAILY AS NEEDED FOR ANXIETY  100 tablet  1  . B Complex-C (SUPER B COMPLEX PO) Take by mouth daily.      Marland Kitchen SIMVASTATIN PO Take 40 mg by mouth.      . TRAMADOL HCL PO Take by mouth. Patient unsure of dosage      . UNKNOWN TO PATIENT Take by mouth daily. Oral contraceptive        Allergies  Allergen Reactions  . Codeine     REACTION: nausea and vomiting  . Doxycycline     REACTION: hives  . Hydrocodone-Acetaminophen     REACTION: vomiting  . Hydrocodone-Homatropine     REACTION: nausea and vomiting    Current Medications, Allergies, Past Medical History, Past Surgical History, Family History, and Social History were reviewed in [employer] record.   Review of Systems    The patient denies fever, chills, sweats, anorexia, fatigue, weakness, malaise, weight loss, sleep disorder, blurring, diplopia, eye irritation, eye discharge, vision loss, eye pain, photophobia, earache, ear discharge, tinnitus, decreased hearing, nasal congestion, nosebleeds, sore throat, hoarseness, chest pain, palpitations, syncope, dyspnea on exertion, orthopnea, PND, peripheral edema, cough, dyspnea at rest, excessive sputum, hemoptysis, wheezing, pleurisy,  nausea, vomiting, diarrhea, constipation, change in bowel habits, abdominal pain, melena, hematochezia, jaundice, gas/bloating, indigestion/heartburn, dysphagia, odynophagia, dysuria, hematuria, urinary frequency, urinary hesitancy, nocturia, incontinence, back pain, joint pain, joint swelling, muscle cramps, muscle weakness, stiffness, arthritis, sciatica, restless legs, leg pain at night, leg pain with exertion, rash, itching, dryness, suspicious lesions, paralysis, paresthesias, seizures, tremors, vertigo, transient blindness, frequent falls, frequent headaches, difficulty walking, depression, anxiety, memory loss, confusion, cold intolerance, heat intolerance, polydipsia, polyphagia, polyuria, unusual weight change, abnormal bruising, bleeding, enlarged lymph nodes, urticaria, allergic rash, hay fever, and recurrent infections.     Objective:   Physical Exam     WD, WN, 51 y/o WF in  NAD.Marland KitchenMarland Kitchen GENERAL:  Alert & oriented; pleasant & cooperative. HEENT:  [place]/AT, EOM-full, PERRLA, [place]i-benign, EACs-clear, TMs-wnl, NOSE-clear, THROAT-clear & wnl. NECK:  Supple w/ full ROM; no JVD; normal carotid impulses w/o bruits; no thyromegaly or nodules palpated; no lymphadenopathy. CHEST:  Clear to P & A; without wheezes/ rales/ or rhonchi. HEART:  Regular Rhythm; without murmurs/ rubs/ or gallops. ABDOMEN:  Soft & nontender; normal bowel sounds; no organomegaly or masses detected. EXT: without deformities or arthritic changes; no varicose veins/ venous insuffic/ or edema. NEURO:  CN's intact; motor testing normal; sensory testing normal; gait normal & balance OK. DERM:  No lesions noted; no rash etc...  RADIOLOGY DATA:  Reviewed in the EPIC EMR & discussed w/ the patient...  LABORATORY DATA:  Reviewed in the EPIC EMR & discussed w/ the patient...   Assessment:     CPX>> reminded to get routine screening colonoscopy at her convenience...  ?MVP>  She's never had 2DEcho & remains asymptomatic; no MSC on  exam today; a non-problem yet still advised to avoid caffeine etc...  Hyperlipid>  Improved w/ Simva40 Qhs & better diet, wt down 9# to 157#; TG still too high & we discussed low fat diet & further wt loss...  GYN>  Followed by DrSMiller on hormones now; she has mild UTI & rec to treat w/ Cipro now...  LBP>  She has seen DrMurphy/Wainer & improved w/ Tramadol; we refilled the Rx & discussed exercise, heat, etc...     Plan:     Patient's Medications  New Prescriptions   FLUTICASONE (FLONASE) 50 MCG/ACT NASAL SPRAY    Place 2 sprays into the nose daily.   SIMVASTATIN (ZOCOR) 40 MG TABLET    Take 1 tablet (40 mg total) by mouth at bedtime.   TRAMADOL (ULTRAM) 50 MG TABLET    Take 1 tablet (50 mg total) by mouth 3 (three) times daily as needed for pain.  Previous Medications   ESTRADIOL (ESTRACE) 1 MG TABLET    Take 1 mg by mouth daily.   MEDROXYPROGESTERONE (PROVERA) 5 MG TABLET    Take 1/2 tablet by mouth for the first 15 days of each month  Modified Medications   Modified Medication Previous Medication   ALPRAZOLAM (XANAX) 0.5 MG TABLET ALPRAZolam (XANAX) 0.5 MG tablet      Take 1/2 to 1 tablet by mouth three times daily as needed for anxiety    TAKE ONE-HALF TO ONE TABLET BY MOUTH THREE TIMES DAILY AS NEEDED FOR ANXIETY  Discontinued Medications   B COMPLEX-C (SUPER B COMPLEX PO)    Take by mouth daily.   FLUOXETINE (PROZAC) 10 MG CAPSULE    Take 10 mg by mouth daily.   SIMVASTATIN PO    Take 40 mg by mouth.   TRAMADOL HCL PO    Take by mouth. Patient unsure of dosage   UNKNOWN TO PATIENT    Take by mouth daily. Oral contraceptive

## 2011-10-23 NOTE — Patient Instructions (Signed)
Today we updated your med list in our EPIC system...    Continue your current medications the same...  We refilled your meds per request...  For you Triglycerides:  Get on a good low fat diet & work on weight reduction...    Generally speaking- if your weight is coming down then the TGs are improving...  Call for any questions.Marland KitchenMarland Kitchen

## 2011-10-24 ENCOUNTER — Encounter: Payer: Self-pay | Admitting: Pulmonary Disease

## 2011-10-24 LAB — VITAMIN D 25 HYDROXY (VIT D DEFICIENCY, FRACTURES): Vit D, 25-Hydroxy: 50 ng/mL (ref 30–89)

## 2011-12-06 ENCOUNTER — Encounter: Payer: BC Managed Care – PPO | Admitting: Internal Medicine

## 2012-01-24 LAB — HM COLONOSCOPY

## 2012-04-29 ENCOUNTER — Other Ambulatory Visit: Payer: Self-pay | Admitting: Pulmonary Disease

## 2012-09-09 ENCOUNTER — Telehealth: Payer: Self-pay | Admitting: Obstetrics & Gynecology

## 2012-09-09 NOTE — Telephone Encounter (Signed)
Pt is having problems with her medications. She has been having issues with her skin breaking out for the last two months. She tried waiting it out to see if it cleared up but instead it is getting worse. She is currently taking Estradiol and Progesterone. Progesterone is what she believes is causing the break outs. She only takes it days 1-15 when she doesn't take it her faces clears up some. She is now having a period again and it has been heavier than it used to be. She is also having breast tenderness; the tenderness has been going on for about 1 week. Please advise on if she should refill the progesterone or if she should stop taking it. Pt is worried about all the different symptoms she has been having.

## 2012-09-09 NOTE — Telephone Encounter (Signed)
She needs to be on some type of progesterone.  We discussed this in Aug when she was here.  She wanted to take the estrogen alone which she CANNOT because she has a uterus.  She was on provera and had more symptoms with that.  She probably needs an OV to discuss options.  Please advise her to not take the estrogen alone.

## 2012-09-09 NOTE — Telephone Encounter (Signed)
See note below of patient symptoms. Last aex 09/24/2011. Last OV 02/06/2012. Please advise.  sue

## 2012-09-09 NOTE — Telephone Encounter (Signed)
Chart in your office  

## 2012-09-09 NOTE — Telephone Encounter (Signed)
Patient notified of Dr. Hyacinth Meeker response and instructions. Patient will call and get appt. In the morning to follow up with a provider here. Patient understands to continue with progesterone and estrogen till sees Dr. Hyacinth Meeker or a provider.

## 2012-09-10 ENCOUNTER — Encounter: Payer: Self-pay | Admitting: Obstetrics & Gynecology

## 2012-09-11 ENCOUNTER — Ambulatory Visit (INDEPENDENT_AMBULATORY_CARE_PROVIDER_SITE_OTHER): Payer: BC Managed Care – PPO | Admitting: Gynecology

## 2012-09-11 ENCOUNTER — Encounter: Payer: Self-pay | Admitting: Gynecology

## 2012-09-11 VITALS — BP 90/60 | HR 80 | Resp 18

## 2012-09-11 DIAGNOSIS — Z780 Asymptomatic menopausal state: Secondary | ICD-10-CM

## 2012-09-11 DIAGNOSIS — Z79899 Other long term (current) drug therapy: Secondary | ICD-10-CM

## 2012-09-11 NOTE — Progress Notes (Signed)
Pt presents today to discuss her HRT.  Pt had been on oral contraception for cycle control and was changed to estradiol/progestin after a low AMH.  Because of the ocp, the pt had never been amenorrheic.  She reports a history of cystic acne before oc's associated with her menses as well as mastalgia.  Pt reports doing well on HRT, taking cyclic.  She reports she essentially did not have any bleeding after progestin and overall felt great.  Pt reports this past month, she began to develop cystic acne, small on her face, she noted an increase in vaginal discharge for a few days that was "slick".  She also reports having mastalgia which she still has.  She reports that this month she bled heavier than usual-3d requiring using 1 pad/d and spotting for another 2-3d afterwards.  Pt is concerned. We had a long discussion regarding differences in OCP and HRT, in that her ovary is not suppressed on HRT.  As she is menopausal by laboratory value and not 1y without menses, it is probable that she developed a follicle this month that may or may not have ovulated.The extra hormones are responsible for the symptoms she described. Pt is assured. We suggest that she watch the monthly withdraw bleed, and if does not return to the prior months on HRT, that she return to the office for further evaluation to rule out uterine pathology and she agrees. All questions were addressed.  >50% face to face discussing HRT  Total time >25 min.

## 2012-09-11 NOTE — Patient Instructions (Signed)

## 2012-10-21 ENCOUNTER — Ambulatory Visit: Payer: Self-pay | Admitting: Obstetrics & Gynecology

## 2012-10-21 ENCOUNTER — Telehealth: Payer: Self-pay | Admitting: Obstetrics & Gynecology

## 2012-10-21 NOTE — Telephone Encounter (Signed)
Patient canceled appointment today due to illness. Patient rescheduled to 02/12/13 @ 9:15

## 2012-10-21 NOTE — Telephone Encounter (Signed)
Patient cx appt to

## 2012-10-23 ENCOUNTER — Ambulatory Visit: Payer: Self-pay | Admitting: Obstetrics & Gynecology

## 2012-10-27 ENCOUNTER — Ambulatory Visit (INDEPENDENT_AMBULATORY_CARE_PROVIDER_SITE_OTHER): Payer: BC Managed Care – PPO | Admitting: Obstetrics & Gynecology

## 2012-10-27 ENCOUNTER — Encounter: Payer: Self-pay | Admitting: Obstetrics & Gynecology

## 2012-10-27 VITALS — BP 118/70 | Ht 61.0 in | Wt 158.8 lb

## 2012-10-27 DIAGNOSIS — N920 Excessive and frequent menstruation with regular cycle: Secondary | ICD-10-CM

## 2012-10-27 DIAGNOSIS — Z124 Encounter for screening for malignant neoplasm of cervix: Secondary | ICD-10-CM

## 2012-10-27 DIAGNOSIS — Z01419 Encounter for gynecological examination (general) (routine) without abnormal findings: Secondary | ICD-10-CM

## 2012-10-27 MED ORDER — NORETHIN-ETH ESTRAD-FE BIPHAS 1 MG-10 MCG / 10 MCG PO TABS: 1.0000 | ORAL_TABLET | Freq: Every day | ORAL | Status: DC

## 2012-10-27 NOTE — Progress Notes (Signed)
52 y.o. G2P2 MarriedCaucasianF here for annual exam.  She reports having no cycles for about six months while on HRT.  She reports missing her progesterone in December.  She had vaginal dryness for a few days.  Then in March, she started cycling again.  She had another cycle in April.  Lasted six days in April.  Three were heavy and the rest were moderate.  Using pads.    She has questions about the amount of calcium she needs to get in her diet and supplements.    Having some hearing loss.  She has seen an audiologist.  Hearing amplifiers recommended.    Patient's last menstrual period was 10/01/2012.          Sexually active: yes  The current method of family planning is tubal ligation.    Exercising: yes  walking at work and on breaks Smoker:  no  Health Maintenance: Pap:  09/24/11 WNL/negative HR HPV MMG:  09/24/11 normal Colonoscopy:  8/13 repeat 2 years/secondary inadequate prep BMD:   none TDaP:  Up to date Labs: 5/13 with Dr. Kriste Basque, elevated triglycerides   reports that she has never smoked. She has never used smokeless tobacco. She reports that  drinks alcohol. She reports that she does not use illicit drugs.  Past Medical History  Diagnosis Date  . Allergic rhinitis   . Hearing loss   . Snoring   . Mitral valve prolapse   . Hyperlipidemia   . Low back pain syndrome   . Anxiety   . Anemia, iron deficiency   . PMS (premenstrual syndrome)   . PMDD (premenstrual dysphoric disorder)     Past Surgical History  Procedure Laterality Date  . Other surgical history  2014    Decompression LS disk  . Tubal ligation  05/30/94  . Tubal ligation  04/20/89    Current Outpatient Prescriptions  Medication Sig Dispense Refill  . ALPRAZolam (XANAX) 0.5 MG tablet TAKE ONE-HALF TO ONE TABLET BY MOUTH THREE TIMES DAILY AS NEEDED FOR ANXIETY  90 tablet  4  . estradiol (ESTRACE) 1 MG tablet Take 1 mg by mouth daily.      . naproxen (NAPROSYN) 500 MG tablet 500 mg as needed.      .  progesterone (PROMETRIUM) 200 MG capsule 200 mg. One tablet days 1-15 monthly      . traMADol (ULTRAM) 50 MG tablet Take 50 mg by mouth as needed for pain.      . fluticasone (FLONASE) 50 MCG/ACT nasal spray Place 2 sprays into the nose as needed.      . simvastatin (ZOCOR) 40 MG tablet Take 1 tablet (40 mg total) by mouth at bedtime.  90 tablet  3   No current facility-administered medications for this visit.    Family History  Problem Relation Age of Onset  . Cancer Father     Lung   . Cancer Mother     Lung Cancer    ROS:  Pertinent items are noted in HPI.  Otherwise, a comprehensive ROS was negative.  Exam:   BP 118/70  Ht 5\' 1"  (1.549 m)  Wt 158 lb 12.8 oz (72.031 kg)  BMI 30.02 kg/m2  LMP 10/01/2012  Weight change: +4lbs  Height: 5\' 1"  (154.9 cm)  Ht Readings from Last 3 Encounters:  10/27/12 5\' 1"  (1.549 m)  10/23/11 5\' 1"  (1.549 m)  07/16/11 5\' 1"  (1.549 m)    General appearance: alert, cooperative and appears stated age Head: Normocephalic, without  obvious abnormality, atraumatic Neck: no adenopathy, supple, symmetrical, trachea midline and thyroid normal to inspection and palpation Lungs: clear to auscultation bilaterally Breasts: normal appearance, no masses or tenderness Heart: regular rate and rhythm Abdomen: soft, non-tender; bowel sounds normal; no masses,  no organomegaly Extremities: extremities normal, atraumatic, no cyanosis or edema Skin: Skin color, texture, turgor normal. No rashes or lesions Lymph nodes: Cervical, supraclavicular, and axillary nodes normal. No abnormal inguinal nodes palpated Neurologic: Grossly normal   Pelvic: External genitalia:  no lesions              Urethra:  normal appearing urethra with no masses, tenderness or lesions              Bartholins and Skenes: normal                 Vagina: normal appearing vagina with normal color and discharge, no lesions              Cervix: no lesions              Pap taken: no Bimanual  Exam:  Uterus:  normal size, contour, position, consistency, mobility, non-tender              Adnexa: no mass, fullness, tenderness               Rectovaginal: Confirms               Anus:  normal sphincter tone, no lesions  A:  Well Woman with normal exam PMP, with low AMH but menorrhagia on HRT DDD Anxiety Elevated triglycerides, followed by Dr. Kriste Basque  P:   Mammogram yearly.  Will schedule for pt. pap smear only today.  Neg HR HPV 4/13 Labs with Dr. Kriste Basque Will have pt return for PUS and possible endometrial biopsy.  Will switch to [facility] for next few months to see if we can get cycles under better control.   return annually or prn  An After Visit Summary was printed and given to the patient.

## 2012-10-27 NOTE — Patient Instructions (Signed)

## 2012-10-28 ENCOUNTER — Other Ambulatory Visit: Payer: Self-pay | Admitting: Pulmonary Disease

## 2012-10-28 ENCOUNTER — Telehealth: Payer: Self-pay | Admitting: Obstetrics & Gynecology

## 2012-10-28 LAB — IPS PAP SMEAR ONLY

## 2012-10-28 NOTE — Telephone Encounter (Signed)
Patient is calling because she is confused about what Dr. Hyacinth Meeker wants her to do with her medications. Patient asked "When I start birth control do I stop estrodial and progesterone?"  Please call patient.

## 2012-10-29 NOTE — Telephone Encounter (Signed)
Have patient stop the HRT and restart the low dose OCP.  I am doing this because of the many events she has going on these next several months and to see if I can get her bleeding managed better.  Patient and I discussed this thoroughly at her visit but she had many questions.   Please make sure she clearly understands.  She should start the pills and stop the HRT when she has her next bleeding episode.  When the bleeding starts, stop the HRT and restart the pills.

## 2012-10-29 NOTE — Telephone Encounter (Signed)
Spoke with pt about stopping HRT and starting the [facility] pill with her next bleeding episode. Pt is to only take the [facility] pill. Explained we are trying to better manage her bleeding. Pt agreeable.

## 2012-11-16 ENCOUNTER — Ambulatory Visit (INDEPENDENT_AMBULATORY_CARE_PROVIDER_SITE_OTHER): Payer: BC Managed Care – PPO | Admitting: Obstetrics & Gynecology

## 2012-11-16 ENCOUNTER — Ambulatory Visit (INDEPENDENT_AMBULATORY_CARE_PROVIDER_SITE_OTHER): Payer: BC Managed Care – PPO

## 2012-11-16 DIAGNOSIS — N950 Postmenopausal bleeding: Secondary | ICD-10-CM

## 2012-11-16 DIAGNOSIS — N83339 Acquired atrophy of ovary and fallopian tube, unspecified side: Secondary | ICD-10-CM

## 2012-11-16 DIAGNOSIS — N920 Excessive and frequent menstruation with regular cycle: Secondary | ICD-10-CM

## 2012-11-16 DIAGNOSIS — D251 Intramural leiomyoma of uterus: Secondary | ICD-10-CM

## 2012-11-16 DIAGNOSIS — D259 Leiomyoma of uterus, unspecified: Secondary | ICD-10-CM

## 2012-11-16 MED ORDER — NORETHIN-ETH ESTRAD-FE BIPHAS 1 MG-10 MCG / 10 MCG PO TABS: 1.0000 | ORAL_TABLET | Freq: Every day | ORAL | Status: DC

## 2012-11-16 NOTE — Patient Instructions (Signed)

## 2012-11-16 NOTE — Progress Notes (Signed)
52 y.o.Marriedfemale here for a pelvic ultrasound.  Had undetectable AMH last year and she was started on HRT.  Had cycle in March that started while she was at [facility].  Was very heavy.  She soaked her clothes.  Had a similar cycle in April.  At last visit, she was started on LoLoestrin for the short term treatment of this bleeding.  Has not bled since starting it but is not completely through a pack yet.  Denies any side effects including headache or nausea.  Patient's last menstrual period was 10/01/2012.  Sexually active:  yes  Contraception: oral contraceptives (estrogen/progesterone)  FINDINGS (report at end of note) UTERUS: 9.2 x 5.3 x 4.6cm with 6mm fibroid EMS: 9.69mm ADNEXA:   Left ovary 2.2 x 1.5 x 1.1cm with 2mm calcification without increased vascularity   Right ovary 1.7 x 1.6 x 1.3cm with 6x10mm area of calcifications without increased vascualrity CUL DE SAC: neg   Endometrial biopsy recommended.  Discussed with patient.  Verbal and written consent obtained.   Procedure:  Speculum placed.  Cervix visualized and cleansed with betadine prep.  A single toothed tenaculum was applied to the anterior lip of the cervix.  Endometrial pipelle could not be advanced through cervix.  Cervix was therefore dilated with Milex dilators.  Then the endometrial pipelle was advanced through the cervix into the endometrial cavity without difficulty.  Pipelle passed to 8cm.  Suction applied and pipelle removed with good tissue sample obtained.  Two passes performed.  Tenculum removed.  No bleeding noted.  Patient tolerated procedure well.  Assessment:  PMP bleeding Plan: Biopsy result pending.  If normal, will continue OCPs.  Risks discussed with pt including but not limited to irregular bleeding, headache, nausea, increased BP, DVT/PE.   Continue LoLoestrin for now.    ~15 minutes spent with patient >50% of time was in face to face discussion of above.

## 2012-11-18 ENCOUNTER — Encounter: Payer: Self-pay | Admitting: Obstetrics & Gynecology

## 2012-11-23 ENCOUNTER — Other Ambulatory Visit: Payer: Self-pay | Admitting: Pulmonary Disease

## 2012-11-27 ENCOUNTER — Encounter: Payer: Self-pay | Admitting: Obstetrics & Gynecology

## 2012-11-30 ENCOUNTER — Other Ambulatory Visit: Payer: Self-pay | Admitting: Obstetrics & Gynecology

## 2012-11-30 ENCOUNTER — Other Ambulatory Visit: Payer: Self-pay | Admitting: Pulmonary Disease

## 2012-11-30 DIAGNOSIS — Z1231 Encounter for screening mammogram for malignant neoplasm of breast: Secondary | ICD-10-CM

## 2013-01-01 ENCOUNTER — Ambulatory Visit: Payer: BC Managed Care – PPO

## 2013-01-04 ENCOUNTER — Telehealth: Payer: Self-pay | Admitting: Pulmonary Disease

## 2013-01-04 MED ORDER — AZITHROMYCIN 250 MG PO TABS: ORAL_TABLET | ORAL | Status: DC

## 2013-01-04 NOTE — Telephone Encounter (Signed)
Spoke with pt Brianna Carroll called to [facility] in [place], [place], pt told to push fluids, alternate tylenol/aleve for neck pain, heating pad as needed per SN.

## 2013-01-07 ENCOUNTER — Ambulatory Visit: Payer: BC Managed Care – PPO

## 2013-01-15 ENCOUNTER — Ambulatory Visit (INDEPENDENT_AMBULATORY_CARE_PROVIDER_SITE_OTHER): Payer: BC Managed Care – PPO | Admitting: Adult Health

## 2013-01-15 ENCOUNTER — Encounter: Payer: Self-pay | Admitting: Adult Health

## 2013-01-15 VITALS — BP 126/80 | HR 74 | Temp 97.6°F | Ht 61.0 in | Wt 154.0 lb

## 2013-01-15 DIAGNOSIS — J069 Acute upper respiratory infection, unspecified: Secondary | ICD-10-CM

## 2013-01-15 MED ORDER — PREDNISONE 10 MG PO TABS: ORAL_TABLET | ORAL | Status: DC

## 2013-01-15 MED ORDER — CEFDINIR 300 MG PO CAPS: 300.0000 mg | ORAL_CAPSULE | Freq: Two times a day (BID) | ORAL | Status: DC

## 2013-01-15 NOTE — Patient Instructions (Addendum)
Omnicef 300mg  Twice daily  For 7 days -take with food  Prednisone taper over next week.  Fluids and rest  Zyrtec 10mg  At bedtime  As needed  Drainage  Mucinex DM Twice daily  As needed  Cough/congestion  Please contact office for sooner follow up if symptoms do not improve or worsen or seek emergency care

## 2013-01-15 NOTE — Progress Notes (Signed)
Subjective:     Patient ID: Brianna Carroll, female   DOB: August 01, 1960, 52 y.o.   MRN: [id number]  HPI  51y/o WF   ~  May 19, 2009:  she's had a good year- states that she is resting well, not snoring as much & husb hasn't complained, resting well, & energy good... she is inquiring about a rec for a plastic surgeon to discuss reduction surg & we gave her DrBarber & DrHolderness numbers...  ~  August 28, 2010:  [name] has had a good yr>  notes some recent allergy symptoms but otherw stable... she has mild hearing loss, hx snoring but has declined sleep study, weight is up 8# & she is committed to better diet & exercise program... she needs a screening colon & will consult GI at her convenience...  Lipid Profile numbers are not at goal but she doesn't want meds & will work w/ her husb to get on the proper diet, incr exerc & get wt down...  ~  Oct 23, 2011:  39mo ROV & CPX> Brianna Carroll has changed jobs from [employer] at [employer] to a similar position in the [employer]- less politics, nepotism, stress;  We reviewed her Problems, Meds, XRays, & Labs> see below>>    She had f/u eval by her GYN, DrSMiller & is perimenopausal, now on Estrace & Provera along w/ some vaginal cream; they tried Fluoxitine but she stopped it due to vivid dreams... She has had some dysuria & UA shows prob UTI> we discussed treating w/ Cipro & if symptoms don't resolve she will need further eval...    She has had some LBP & has seen DrMurphy/Wainer- Tramadol helps & we will refill; discussed diet, exercises, heat, etc... CXR 5/13 showed normal heart size, clear lungs, NAD... LABS 5/13:  FLP- chol=ok TG=318 on diet alone;  Chems- wnl x SGPT=41;  CBC- wnl;  TSH=2.80;  UA- +wbc/bact & we will rx w/ Cipro..  01/15/2013 Acute OV  Complains of prod cough with light yellow mucus, pressure in chest, chills/sweats, sinus pressure/congestion, PND, increased SOB x2 weeks, was given zpak 10days ago. Cough and congestion are not  getting better.  More wheezing over last couple of days Taking cold meds otc without much help Cough is keeping her up at night .  No fever, hemoptysis, chest pain or edema.    Problem List:    << PROBLEM LIST UPDATED 10/23/11 >>  ALLERGIC RHINITIS (ICD-477.9) - hx +allergy testing to dust; uses OTC antihist & FLONASE prn... ~  She had sinusitis 1/13 treated by TP w/ Augmentin, Mucinex, Saline & resolved...  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  Hx of SNORING (ICD-786.09) - eval 11/06 by Gayla Medicus for snoring & prob OSA, but pt never had sleep study due to the cost... we revisitied this 1/09 but she again cancelled the sleep study due to cost... ~  12/10: she notes that she is snoring less, husb not complaining, resting well, & energy is good; we reviewed the imperitive of weight loss...  Hx of MITRAL VALVE PROLAPSE (ICD-424.0) - ? MVP (never had Echo)... she denies symptoms except for occas palpit> no CP, dizzy, syncope, SOB, edema, etc... ~  EKG 3/12 showed NSR, rate72, late transition, NAD...  HYPERLIPIDEMIA (ICD-272.4) - Chol as high as 263 & LDL up to 185 in past... ~  FLP  11/07 on Vytor10-20 showed TChol 141, TG 124, HDL, 44, LDL 72 ~  FLP 1/09 on Vytor10-20 showed TChol 216, TG 210, HDL 41, LDL 133... rec change to Simva40. ~  FLP 7/09 on Simva40 showed TChol 163, TG 211, HDL 40, LDL 99 ~  FLP 12/10 on Simva40 showed TChol 145, TG 159, HDL 43, LDL 70 ~  FLP 3/12 on Simva40 (wt=166#) showed TChol 220, TG 327, HDL 52, LDL 133... rec to take med daily + diet, exercise, wt loss... ~  FLP 5/13 on Simva40 (wt=157#) showed TChol 171, TGT 318, HDL 52, LDL 94... Improved Chol, needs better low fat/ get wt down...  GI >> she needs baseline Colonoscopy for screening & she will sched at her convenience...  GYN >> she prev saw DrMcPhail w/ heavy menses, Rx  BCPs, Effexor, etc... She now sees DrSMiller & is perimenopausal> started Estrace & Provera, along w/ a vag cream; tried Fluoxitine but INTOL w/ vivid dreams & stopped... ~  Labs 5/13 w/ prob mild UTI & she was given CIPRO 250mg Bid to treat...  LOW BACK PAIN SYNDROME (ICD-724.2) - hx LBP eval by DrYates in past; second opinion DrNudelman 1999 w/ dx DDD & rx conservatively; eventually needed LLam & microdiscectomy 6/04 by Lenon Oms. ~  5/13:  She reports some "back spasms" that improved after eval by DrMurphy/Wainer w/ TRAMADOL 50mg  Prn (we refilled Rx & discussed exercise program etc)...  ANXIETY (ICD-300.00) - on ALPRAZOLAM 0.5mg  Prn...  ANEMIA, IRON DEFICIENCY, HX OF (ICD-V12.3) - hx heavy menses and prev followed by DrMcPhail> prev Rx w/ oral iron supplements... ~  labs 1/09 showed Hg= 14.4 ~  labs 12/10 showed Hg= 14.6 ~  Labs 3/12 showed Hg= 14.2 ~  Labs 5/13 showed Hg= 14.5   Past Surgical History  Procedure Laterality Date  . Tubal ligation  05/30/94  . Cesarean section  11/90, 12/95  . Lumbar disc surgery  2004     Outpatient Encounter Prescriptions as of 01/15/2013  Medication Sig Dispense Refill  . ALPRAZolam (XANAX) 0.5 MG tablet TAKE ONE-HALF TO ONE TABLET BY MOUTH THREE TIMES DAILY AS NEEDED FOR ANXIETY  90 tablet  1  . estradiol (ESTRACE) 1 MG tablet Take 1 mg by mouth daily.      . fluticasone (FLONASE) 50 MCG/ACT nasal spray Place 2 sprays into the nose as needed.      . naproxen (NAPROSYN) 500 MG tablet 500 mg as needed.      . Norethindrone-Ethinyl Estradiol-Fe Biphas (LO LOESTRIN FE) 1 MG-10 MCG / 10 MCG tablet Take 1 tablet by mouth daily.  1 Package  11  . simvastatin (ZOCOR) 40 MG tablet TAKE ONE TABLET BY MOUTH AT BEDTIME  30 tablet  1  . traMADol (ULTRAM) 50 MG tablet TAKE ONE TABLET BY MOUTH THREE TIMES DAILY AS NEEDED FOR PAIN  90 tablet  5  . cefdinir (OMNICEF) 300 MG capsule Take 1 capsule (300 mg total) by mouth 2 (two) times daily.  14 capsule  0  .  predniSONE (DELTASONE) 10 MG tablet 4 tabs for 2 days, then 3 tabs for 2 days, 2 tabs for 2 days, then 1 tab for 2 days, then stop  20 tablet  0  . [DISCONTINUED] azithromycin (ZITHROMAX) 250 MG tablet Take as directed  6 tablet  0  . [DISCONTINUED] progesterone (PROMETRIUM) 200 MG capsule 200 mg. One tablet days 1-15 monthly       No facility-administered encounter medications on file as of 01/15/2013.  Allergies  Allergen Reactions  . Doxycycline Rash    REACTION: hives  . Codeine     REACTION: nausea and vomiting  . Hydrocodone-Acetaminophen     REACTION: vomiting  . Hydrocodone-Homatropine     REACTION: nausea and vomiting    Current Medications, Allergies, Past Medical History, Past Surgical History, Family History, and Social History were reviewed in [employer] record.   Review of Systems     The patient denies fever, chills, sweats, anorexia, fatigue, weakness, malaise, weight loss, sleep disorder, blurring, diplopia, eye irritation, eye discharge, vision loss, eye pain, photophobia, earache, ear discharge, tinnitus, decreased hearing, nosebleeds, sore throat, hoarseness, chest pain, palpitations, syncope,  dyspnea at rest, excessive sputum, hemoptysis, pleurisy, nausea, vomiting, diarrhea, constipation, change in bowel habits, abdominal pain, melena, hematochezia, jaundice, gas/bloating, indigestion/heartburn, dysphagia, odynophagia, dysuria, hematuria, urinary frequency, urinary hesitancy, nocturia, incontinence, back pain, joint pain, joint swelling, muscle cramps, muscle weakness, stiffness, arthritis, sciatica, restless legs, leg pain at night, leg pain with exertion, rash, itching, dryness, suspicious lesions, paralysis, paresthesias, seizures, tremors, vertigo, transient blindness, frequent falls, frequent headaches, difficulty walking, depression, anxiety, memory loss, confusion, cold intolerance, heat intolerance, polydipsia, polyphagia, polyuria,  unusual weight change, abnormal bruising, bleeding, enlarged lymph nodes, urticaria, allergic rash, hay fever, and recurrent infections.     Objective:   Physical Exam      WD, WN, 52 y/o WF in NAD... GENERAL:  Alert & oriented; pleasant & cooperative. HEENT:  [place]owler/AT,   EACs-clear, TMs-wnl, NOSE-clear drainage  THROAT-clear & wnl. NECK:  Supple w/ full ROM; no JVD; normal carotid impulses w/o bruits; no thyromegaly or nodules palpated; no lymphadenopathy. CHEST:  Clear to P & A; without wheezes/ rales/ or rhonchi. HEART:  Regular Rhythm; without murmurs/ rubs/ or gallops. ABDOMEN:  Soft & nontender; normal bowel sounds; no organomegaly or masses detected. EXT: without deformities or arthritic changes; no varicose veins/ venous insuffic/ or edema. NEURO:    gait normal & balance OK. DERM:  No lesions noted; no rash etc...   Assessment:

## 2013-01-19 NOTE — Assessment & Plan Note (Addendum)
Slow to resolve URI vs AB   Plan :  Omnicef 300mg  Twice daily  For 7 days -take with food  Prednisone taper over next week.  Fluids and rest  Zyrtec 10mg  At bedtime  As needed  Drainage  Mucinex DM Twice daily  As needed  Cough/congestion  Please contact office for sooner follow up if symptoms do not improve or worsen or seek emergency care

## 2013-02-05 ENCOUNTER — Telehealth: Payer: Self-pay | Admitting: Pulmonary Disease

## 2013-02-05 ENCOUNTER — Ambulatory Visit: Payer: Self-pay | Admitting: Obstetrics & Gynecology

## 2013-02-05 MED ORDER — SIMVASTATIN 40 MG PO TABS: ORAL_TABLET | ORAL | Status: DC

## 2013-02-05 NOTE — Telephone Encounter (Signed)
Pt aware that we have not received any rx request from [facility] for her Simvastatin rx. Pt also aware that I have sent the refill to [facility] while on phone with her. Nothing more needed at this time.

## 2013-02-08 ENCOUNTER — Telehealth: Payer: Self-pay | Admitting: Obstetrics & Gynecology

## 2013-02-08 ENCOUNTER — Ambulatory Visit
Admission: RE | Admit: 2013-02-08 | Discharge: 2013-02-08 | Disposition: A | Discharge status: Home / Self Care | Payer: BC Managed Care – PPO | Source: Ambulatory Visit | Attending: Obstetrics & Gynecology | Admitting: Obstetrics & Gynecology

## 2013-02-08 DIAGNOSIS — Z1231 Encounter for screening mammogram for malignant neoplasm of breast: Secondary | ICD-10-CM

## 2013-02-08 NOTE — Telephone Encounter (Signed)
Patient notified by detailed message on VM (per patient instruction) of Dr Rondel Baton instructions.  Will leave Premarin vaginal cream samples for patient and instructed to use 1/2 gram per vagina twice weekly and update [place] in 4-6 weeks. Call with questions. $ sample tubes Premarin vaginal cream Lot [id number] exp 12-2013. Left for patient at desk.

## 2013-02-08 NOTE — Telephone Encounter (Signed)
AEX 10-27-12 PUS/endo bx 11-16-12, normal, changed from HRT to Lo Loestrin to better control her peri-menopausal bleeding.  Patient reports that it has worked very well for cycle and symptom control except for episodes of vaginal dryness.  She has had this problem before and it improved with adjustments in medication. Uncomfortable all the time, worse with exercise, so uncomfortable will not have intercourse.  Some itching, no vaginal discharge or odor.  Vagisil made it worse. Also tried Vaseline for temporary relief.  Really does not think it is yeast.  States this is just like previous time and it was diagnosed as dryness.  Really likes Lo Loestrin except for this one problem  It occurred on last pill pack as well for a few days and spontaneously resolved.  Advised to d/c Vagisil, can try hydrocortisone oint and Aveeno sitz bath.  Will discuss with MD and call her back

## 2013-02-08 NOTE — Telephone Encounter (Addendum)
Patient is calling in for advice , she is having vaginal dryness and wants to know what to do ? You can leave message on her cell phone.

## 2013-02-08 NOTE — Telephone Encounter (Signed)
I don't want to change oral contraceptive pills as this is the lowest dose pill and I want her to stay on this.  We can try vaginal estrogen cream--premarin or estrace.  Can we leave samples.  1/2 -1 gram vaginally twice weekly.

## 2013-02-08 NOTE — Telephone Encounter (Signed)
Clarify phone note, 4 sample tubes of premarin cream left for patient.

## 2013-02-12 ENCOUNTER — Ambulatory Visit: Payer: BC Managed Care – PPO | Admitting: Obstetrics & Gynecology

## 2013-02-25 ENCOUNTER — Ambulatory Visit (INDEPENDENT_AMBULATORY_CARE_PROVIDER_SITE_OTHER): Payer: BC Managed Care – PPO | Admitting: Obstetrics & Gynecology

## 2013-02-25 ENCOUNTER — Ambulatory Visit: Payer: BC Managed Care – PPO | Admitting: Obstetrics & Gynecology

## 2013-02-25 ENCOUNTER — Encounter: Payer: Self-pay | Admitting: Obstetrics & Gynecology

## 2013-02-25 VITALS — BP 140/82 | HR 60 | Resp 16 | Ht 61.0 in | Wt 154.8 lb

## 2013-02-25 DIAGNOSIS — R6882 Decreased libido: Secondary | ICD-10-CM

## 2013-02-25 MED ORDER — NORETHIN-ETH ESTRAD-FE BIPHAS 1 MG-10 MCG / 10 MCG PO TABS: 1.0000 | ORAL_TABLET | Freq: Every day | ORAL | Status: DC

## 2013-02-25 NOTE — Progress Notes (Signed)
Subjective:     Patient ID: Brianna Carroll, female   DOB: 1960-08-15, 52 y.o.   MRN: [id number]  HPI 52 yo MWF here for f/u after starting Loloestrin for DUB.  Having only two days of cycling and this is on the the second brown pill.  She called last week and was given Premarin samples.  Has only used it twice.  She was really unsure about how frequently to be using.  She is having the dryness just the week before her cycle starts.  We discussed using the premarin cream, 1/2gam twice weekly, just during this week.  Has four more tubes.    Other question about libido.  Feels like she has to work a lot harder at getting stimulated.  We discussed topical testosterone use.  She does think getting "massaged" helps with stimulation but feels this is a burden to ask her spouse to do this every time she wants to be sexually active.  D/W pt foreplay needs of women vs men and changes that occur perimenopausal ly.  All questions answered.   Review of Systems  All other systems reviewed and are negative.       Objective:   Physical Exam  Constitutional: [name], place, and time. She appears well-developed and well-nourished.  HENT:  Head: Normocephalic and atraumatic.  Neurological: She is alert and oriented to person, place, and time.  Skin: Skin is warm and dry.  Psychiatric: She has a normal mood and affect.       Assessment:     Perimenopausal DUB, controlled will with Loloestrin Decreased libido Vaginal dryness, probably OCP related     Plan:     Consider starting topical terstosterone.  Level drawn today.  If low, will send RX to pharmacy.  Will need to do this with compounding pharmacy.  Pt unsure if one is close to her home.  Pt lives in [place].  Pt aware this is off label use.  Risks of acne, mood changes, hair growth, voice changes all discussed.  Will need to monitor level if we start medication. Coupon for [employer] given.  RX to pharamcy. Pt has samples for  Premarin.  She will use this only the week before her cycle, 1/2gm pv twice weekly    ~15 minutes spent with patient >50% of time was in face to face discussion of above.

## 2013-02-26 LAB — TESTOSTERONE: Testosterone: 33 ng/dL (ref 10–70)

## 2013-03-03 ENCOUNTER — Encounter: Payer: Self-pay | Admitting: Obstetrics & Gynecology

## 2013-03-17 ENCOUNTER — Telehealth: Payer: Self-pay

## 2013-03-17 NOTE — Telephone Encounter (Signed)
Message copied by Elisha Headland on Wed Mar 17, 2013  9:58 AM ------      Message from: Jerene Bears      Created: Sun Mar 14, 2013  3:37 PM      Regarding: unread MyChart message       Tresa Endo.  Can you please call mrs. Comer.  Note from My Chart below.            Mrs. Texeira,        The testosterone level I checked on you is in the normal range but on the low side.  If you want to try some topical testosterone supplement to see if that will help, I am glad to call it in for you.  Let me know.  It will need to be at a pharmacy in [place] that can compound and I could not locate one near you that did this.  Will that be ok?                Dr. Hyacinth Meeker  ------

## 2013-03-17 NOTE — Telephone Encounter (Signed)
Patient notified of testosterone level. Would like to start the topical testosterone. Aware will [facility] and will not be sent until Monday. Appointment for 3 month testosterone recheck scheduled. Explained that all the instructions will be with the rx-how to use and ow many times per week. She states she will call back with any issues.

## 2013-03-22 ENCOUNTER — Telehealth: Payer: Self-pay | Admitting: *Deleted

## 2013-03-22 NOTE — Telephone Encounter (Signed)
Patient calling to see if rx for Testosterone cream was called in. If not would like her rx called into [facility] it's right by her work. If it was called in [facility] is fine.   Please let patient know.

## 2013-03-23 NOTE — Telephone Encounter (Signed)
Patient would like to start the testosterone and if we can sent to [facility], that would be closer to her. Will you put in RX and I can fax it over.

## 2013-03-26 ENCOUNTER — Other Ambulatory Visit: Payer: Self-pay | Admitting: Pulmonary Disease

## 2013-03-26 MED ORDER — NONFORMULARY OR COMPOUNDED ITEM: Status: DC

## 2013-03-26 NOTE — Telephone Encounter (Signed)
rx done.  Will fax to [facility].

## 2013-04-02 NOTE — Telephone Encounter (Signed)
Patient aware was going to switch rx to [facility], but they do not compound it like we are familiar with. rx [facility].

## 2013-04-02 NOTE — Telephone Encounter (Signed)
informed patient- apothecary does not compound testosterone cream like [facility]. So I have [facility]ed to [facility] and patient will be calling them to find out if her insurance will cover or the price if not. Will call back prn.

## 2013-04-15 ENCOUNTER — Telehealth: Payer: Self-pay | Admitting: Obstetrics & Gynecology

## 2013-04-15 NOTE — Telephone Encounter (Signed)
Per [facility] no rx was sent through. Ok'ed Loloestrin FE one daily, #1/7RF. Patient has AEX scheduled 6/15//kn

## 2013-04-15 NOTE — Telephone Encounter (Signed)
Called rx to pharmacy

## 2013-04-15 NOTE — Telephone Encounter (Signed)
Patient needs refill of Lo Loestrin FE. It looks like to me that we sent this prescription in September with 12 refills but [facility] is telling [facility] they done [facility] it  [facility] [place]

## 2013-04-22 ENCOUNTER — Other Ambulatory Visit: Payer: Self-pay

## 2013-04-24 ENCOUNTER — Other Ambulatory Visit: Payer: Self-pay | Admitting: Pulmonary Disease

## 2013-04-26 ENCOUNTER — Telehealth: Payer: Self-pay | Admitting: Pulmonary Disease

## 2013-04-26 NOTE — Telephone Encounter (Signed)
Error.Brianna Carroll ° °

## 2013-06-14 ENCOUNTER — Other Ambulatory Visit: Payer: BC Managed Care – PPO

## 2013-06-18 ENCOUNTER — Telehealth: Payer: Self-pay

## 2013-06-18 DIAGNOSIS — R6882 Decreased libido: Secondary | ICD-10-CM

## 2013-06-18 NOTE — Telephone Encounter (Signed)
Message copied by Robley Fries on Fri Jun 18, 2013 10:08 AM ------      Message from: Megan Salon      Created: Wed Jun 16, 2013  7:48 AM       Please let pt know labs were not run as she is not on the medication.  Once she starts, she should call so I know when to have her come back for labs.              MSM      ----- Message -----         From: Blanchard Kelch         Sent: 06/14/2013  10:51 AM           To: Lyman Speller, MD            Pt came in for testosterone recheck but has not started the cream that was prescribed, she plans on starting next week. I went ahead and drew her but need the order put in. Wasn't sure if you wanted to run it since she has not started the cream.            Caryl Pina       ------

## 2013-06-18 NOTE — Telephone Encounter (Signed)
6weeks.  Order placed.

## 2013-06-18 NOTE — Telephone Encounter (Signed)
Spoke with patient-aware we did not run the labs. States she started this medication this week. Please advise when she should come back in for recheck of lab work?

## 2013-06-29 NOTE — Telephone Encounter (Signed)
Left detailed message for patient to call back to schedule f/u lab appointment in 6 weeks.

## 2013-07-01 ENCOUNTER — Telehealth: Payer: Self-pay | Admitting: Obstetrics & Gynecology

## 2013-07-01 NOTE — Telephone Encounter (Signed)
Patient says she is returning a call to [place]. No phone messages from [place].

## 2013-07-01 NOTE — Telephone Encounter (Signed)
Lab appointment scheduled for 2/19 at 0830.

## 2013-08-02 ENCOUNTER — Telehealth: Payer: Self-pay | Admitting: Pulmonary Disease

## 2013-08-02 ENCOUNTER — Other Ambulatory Visit: Payer: Self-pay | Admitting: Pulmonary Disease

## 2013-08-02 DIAGNOSIS — R51 Headache: Principal | ICD-10-CM

## 2013-08-02 DIAGNOSIS — R519 Headache, unspecified: Secondary | ICD-10-CM

## 2013-08-02 MED ORDER — ISOMETHEPTENE-APAP-DICHLORAL 65-325-100 MG PO CAPS: 1.0000 | ORAL_CAPSULE | Freq: Four times a day (QID) | ORAL | Status: DC | PRN

## 2013-08-02 NOTE — Telephone Encounter (Signed)
Last OV with TP 01-2013. Pt states she has been having headache over her right eye, and pain and pressure. She also states she will have some dizziness. She states it usually starts in the morning. She will take so ibuprofen and this helps some. She has tried benadryl, OTC congestion medication and nyquil without relief. Pt states she heard a commercial that stated if you have these symptoms then to contact your doctor. She denies sinus congestion, cough, nasal congestion.  Pt states this is not an emergency, she just wants any recs that Dr. Lenna Gilford may have for the headaches. Please advise. Alton Bing, CMA Allergies  Allergen Reactions  . Doxycycline Rash    REACTION: hives  . Codeine     REACTION: nausea and vomiting  . Hydrocodone-Acetaminophen     REACTION: vomiting  . Hydrocodone-Homatropine     REACTION: nausea and vomiting

## 2013-08-02 NOTE — Telephone Encounter (Signed)
Per

## 2013-08-02 NOTE — Telephone Encounter (Signed)
Per SN, this sounds like more of a chronic daily headache vs a sinus headache so he recs midrin 1 tablet every 6 hours as needed #30 and referral to neurology. Pt aware, rx sent and order placed. [place] Bing, CMA

## 2013-08-05 ENCOUNTER — Telehealth: Payer: Self-pay | Admitting: Pulmonary Disease

## 2013-08-05 ENCOUNTER — Other Ambulatory Visit: Payer: BC Managed Care – PPO

## 2013-08-05 MED ORDER — SIMVASTATIN 40 MG PO TABS: ORAL_TABLET | ORAL | Status: DC

## 2013-08-05 MED ORDER — ALPRAZOLAM 0.5 MG PO TABS: ORAL_TABLET | ORAL | Status: DC

## 2013-08-05 NOTE — Telephone Encounter (Signed)
LMTCB

## 2013-08-05 NOTE — Telephone Encounter (Signed)
Returning call can be reached at [phone].Elnita Maxwell

## 2013-08-05 NOTE — Telephone Encounter (Signed)
Called and spoke with pt. She needed refills on alprazolam and simvastatin. Refills and appt has been called in. Nothing further needed

## 2013-08-13 ENCOUNTER — Other Ambulatory Visit: Payer: BC Managed Care – PPO

## 2013-08-19 ENCOUNTER — Other Ambulatory Visit: Payer: Self-pay | Admitting: Obstetrics & Gynecology

## 2013-08-19 ENCOUNTER — Other Ambulatory Visit: Payer: Self-pay | Admitting: Pulmonary Disease

## 2013-08-19 ENCOUNTER — Ambulatory Visit (INDEPENDENT_AMBULATORY_CARE_PROVIDER_SITE_OTHER): Payer: BC Managed Care – PPO | Admitting: Obstetrics & Gynecology

## 2013-08-19 DIAGNOSIS — R6882 Decreased libido: Secondary | ICD-10-CM

## 2013-08-19 DIAGNOSIS — M545 Low back pain, unspecified: Secondary | ICD-10-CM

## 2013-08-19 DIAGNOSIS — J329 Chronic sinusitis, unspecified: Secondary | ICD-10-CM

## 2013-08-19 DIAGNOSIS — R899 Unspecified abnormal finding in specimens from other organs, systems and tissues: Secondary | ICD-10-CM

## 2013-08-20 LAB — TESTOSTERONE: TESTOSTERONE: 710 ng/dL — AB (ref 10–70)

## 2013-08-24 ENCOUNTER — Telehealth: Payer: Self-pay | Admitting: *Deleted

## 2013-08-24 NOTE — Telephone Encounter (Signed)
Pt has been set up with new primary care doctor---Dr. Birdie Riddle on 10/28/2013.

## 2013-08-30 ENCOUNTER — Ambulatory Visit: Payer: BC Managed Care – PPO | Admitting: Neurology

## 2013-09-16 ENCOUNTER — Ambulatory Visit: Payer: BC Managed Care – PPO | Admitting: Pulmonary Disease

## 2013-10-04 ENCOUNTER — Telehealth: Payer: Self-pay | Admitting: Certified Nurse Midwife

## 2013-10-04 NOTE — Telephone Encounter (Signed)
Pt is having issues with her loloestrin. She is having 2 cycles a month and having dryness and burning. Also experiencing burning while taking the premarin.

## 2013-10-04 NOTE — Telephone Encounter (Signed)
Spoke with patient. She clarified that she is doing much better on Loloestrin, having one cycle per month and bleeding is very light, if any. Her concerns at this time are vaginal dryness/vaginal irritation/Vaginal pain, that she feels is r/t the [employer]. She has not used premarin in approximately 4 months. One month ago,  she was having vaginal pain and irritation and used premarin x 1 and felt burning upon placement, she wiped off medicine and placed a cold pack on vaginal area and felt improved. Advised patient not to use premarin on open areas of skin and if she is having irritation of skin, patient does agree that "I had let myself get in a bad way and then used the premarin."  She has not used premarin since. She was using an otc vaginal moisturizer and not having relief as well. Today, she states, her vaginal area is "much better", so she will try premarin again, to see if any issues since skin is not irritated today.  Offered multiple times to patient for office visit for evaluation with Dr. Sabra Heck and patient declines, lives out of area and states "I can wait this out, I really don't want to have to come in twice."   Patient would like to know if Dr. Sabra Heck would like her to [facility]. Advised patient to continue with Loloestrin as directed until she sees Dr. Sabra Heck next. Patient states she will try Premarin and return call with any issues and if she has problems with premarin again, she will come in for office visit.   Routing to provider for final review. Patient agreeable to disposition. Will close encounter

## 2013-10-06 MED ORDER — ESTROGENS, CONJUGATED 0.625 MG/GM VA CREA: 1.0000 | TOPICAL_CREAM | Freq: Every day | VAGINAL | Status: DC

## 2013-10-06 NOTE — Telephone Encounter (Signed)
Spoke with patient. She would like to keep annual as scheduled. She is going to think more about what she would like to do. Unsure about stopping Loloestrin now that she has been thinking about things. She will think about it and keep annual as scheduled to discuss.   Will close encounter.

## 2013-10-06 NOTE — Telephone Encounter (Signed)
Left message to call Kaitlyn at [phone]. 

## 2013-10-06 NOTE — Addendum Note (Signed)
Addended by: Michele Mcalpine on: 10/06/2013 02:34 PM   Modules accepted: Orders, Medications

## 2013-10-06 NOTE — Telephone Encounter (Signed)
Instructions should be 1/2 gram vaginally twice weekly, not applicator full.  Applicator full is 2grams.  It's too much and messy.  Please inform pt.

## 2013-10-06 NOTE — Telephone Encounter (Signed)
Patient returned call. Requests refill of premarin vaginal cream as she is out of the samples given.  Refill sent to [facility]. Called to clarify order. Premarin vaginal cream 5.974 mg/gr 1 applicator full daily for two weeks, then 1 applicator full two times a week.

## 2013-10-06 NOTE — Telephone Encounter (Signed)
Fine to stop the Loloestrin.  If cycles become irregular again, she needs to let me know.  Should have AEX in the late summer or early fall.  Can wait to try the Premarin until after she has stopped the Loloestrin to see if this improves the symptoms.  If no questions, ok to close encounter.

## 2013-10-07 NOTE — Telephone Encounter (Signed)
Left message to call [name] at [phone]. 

## 2013-10-07 NOTE — Telephone Encounter (Signed)
Spoke with patient. Message from Bells given as seen below. Patient agreeable and verbalizes understanding. States she is going to start Premarin tonight and will use 0.5 grams. Will call back with any further needs, questions, or concerns.  Routing to provider for final review. Patient agreeable to disposition. Will close encounter

## 2013-10-26 ENCOUNTER — Ambulatory Visit: Payer: BC Managed Care – PPO | Admitting: Family Medicine

## 2013-10-28 ENCOUNTER — Ambulatory Visit: Payer: BC Managed Care – PPO | Admitting: Family Medicine

## 2013-11-25 ENCOUNTER — Ambulatory Visit (INDEPENDENT_AMBULATORY_CARE_PROVIDER_SITE_OTHER): Payer: BC Managed Care – PPO | Admitting: Obstetrics & Gynecology

## 2013-11-25 ENCOUNTER — Encounter: Payer: Self-pay | Admitting: Obstetrics & Gynecology

## 2013-11-25 VITALS — BP 118/82 | HR 60 | Resp 20 | Ht 61.0 in | Wt 159.2 lb

## 2013-11-25 DIAGNOSIS — Z01419 Encounter for gynecological examination (general) (routine) without abnormal findings: Secondary | ICD-10-CM

## 2013-11-25 DIAGNOSIS — Z23 Encounter for immunization: Secondary | ICD-10-CM

## 2013-11-25 DIAGNOSIS — R899 Unspecified abnormal finding in specimens from other organs, systems and tissues: Secondary | ICD-10-CM

## 2013-11-25 DIAGNOSIS — Z0000 Encounter for general adult medical examination without abnormal findings: Secondary | ICD-10-CM

## 2013-11-25 DIAGNOSIS — N951 Menopausal and female climacteric states: Secondary | ICD-10-CM

## 2013-11-25 DIAGNOSIS — R6889 Other general symptoms and signs: Secondary | ICD-10-CM

## 2013-11-25 LAB — LIPID PANEL
Cholesterol: 201 mg/dL — ABNORMAL HIGH (ref 0–200)
HDL: 52 mg/dL (ref >39)
LDL Cholesterol: 84 mg/dL (ref 0–99)
Total CHOL/HDL Ratio: 3.9 Ratio
Triglycerides: 323 mg/dL — ABNORMAL HIGH (ref <150)
VLDL: 65 mg/dL — ABNORMAL HIGH (ref 0–40)

## 2013-11-25 LAB — COMPREHENSIVE METABOLIC PANEL
ALT: 41 U/L — ABNORMAL HIGH (ref 0–35)
AST: 28 U/L (ref 0–37)
Albumin: 4.5 g/dL (ref 3.5–5.2)
Alkaline Phosphatase: 56 U/L (ref 39–117)
BILIRUBIN TOTAL: 0.5 mg/dL (ref 0.2–1.2)
BUN: 11 mg/dL (ref 6–23)
CO2: 25 meq/L (ref 19–32)
CREATININE: 0.77 mg/dL (ref 0.50–1.10)
Calcium: 9.8 mg/dL (ref 8.4–10.5)
Chloride: 104 mEq/L (ref 96–112)
Glucose, Bld: 84 mg/dL (ref 70–99)
Potassium: 4.5 mEq/L (ref 3.5–5.3)
Sodium: 140 mEq/L (ref 135–145)
Total Protein: 6.7 g/dL (ref 6.0–8.3)

## 2013-11-25 MED ORDER — ESTROGENS, CONJUGATED 0.625 MG/GM VA CREA: TOPICAL_CREAM | VAGINAL | Status: DC

## 2013-11-25 NOTE — Patient Instructions (Signed)

## 2013-11-25 NOTE — Progress Notes (Signed)
53 y.o. G2P2 MarriedCaucasianF here for annual exam.   Stopped OCPs in April.  Hasn't had any bleeding since.  Now having hot flashes and vaginal dryness.  Premarin cream has helped.    Pt reports she is waking up with headaches.  Motrin 600mg  will knock it out.  Pt doesn't "feel" like it is a migraine.  She has no nausea, light sensitivity with headaches.  Midrin (which was written by Dr. Lenna Gilford) doesn't help.  Patient would like to not have vaginal dryness but she doesn't really want to be on oral/regular HRT.  Having some sleep issues.    Patient's last menstrual period was 09/29/2013.          Sexually active: yes  The current method of family planning is tubal ligation.    Exercising: yes   Smoker:  no  Health Maintenance: Pap:  10/28/12-WNL History of abnormal Pap:  no MMG:  02/08/13-normal Colonoscopy:  8/13-repeat in 2 years (inadequate prep) BMD:   none TDaP:  PCP ? When-will check at [place]: PCP, Hb today: PCP, Urine today: PCP   reports that she has never smoked. She has never used smokeless tobacco. She reports that she drinks alcohol. She reports that she does not use illicit drugs.  Past Medical History  Diagnosis Date  . Allergic rhinitis   . Hearing loss   . Snoring   . Mitral valve prolapse   . Hyperlipidemia   . Low back pain syndrome   . Anxiety   . Anemia, iron deficiency   . PMDD (premenstrual dysphoric disorder)     Past Surgical History  Procedure Laterality Date  . Tubal ligation  05/30/94  . Cesarean section  11/90, 12/95  . Lumbar disc surgery  2004    Current Outpatient Prescriptions  Medication Sig Dispense Refill  . ALPRAZolam (XANAX) 0.5 MG tablet TAKE ONE-HALF TO ONE TABLET BY MOUTH THREE TIMES DAILY AS NEEDED FOR ANXIETY  90 tablet  3  . conjugated estrogens (PREMARIN) vaginal cream Place 1 Applicatorful vaginally daily.  42.5 g  1  . simvastatin (ZOCOR) 40 MG tablet TAKE ONE TABLET BY MOUTH AT BEDTIME  30 tablet  3  . traMADol  (ULTRAM) 50 MG tablet TAKE ONE TABLET BY MOUTH THREE TIMES DAILY AS NEEDED FOR PAIN  90 tablet  5  . fluticasone (FLONASE) 50 MCG/ACT nasal spray Place 2 sprays into the nose as needed.      . isometheptene-acetaminophen-dichloralphenazone (MIDRIN) 65-325-100 MG capsule Take 1 capsule by mouth every 6 (six) hours as needed for migraine. Maximum 5 capsules in 12 hours for migraine headaches, 8 capsules in 24 hours for tension headaches.  30 capsule  0  . naproxen (NAPROSYN) 500 MG tablet 500 mg as needed.      . NONFORMULARY OR COMPOUNDED ITEM Topical testosterone propionate 2% in petrolatum, use 1/4 tsp to labial or vulvar region 2 times a week, dispense 60 grams  1 each  1  . Norethindrone-Ethinyl Estradiol-Fe Biphas (LO LOESTRIN FE) 1 MG-10 MCG / 10 MCG tablet Take 1 tablet by mouth daily.  1 Package  12   No current facility-administered medications for this visit.    Family History  Problem Relation Age of Onset  . Cancer Father     Lung   . Cancer Mother     Lung Cancer  . Cancer Brother     throat cancer-heavy smoker  . COPD Sister     ROS:  Pertinent items are noted  in HPI.  Otherwise, a comprehensive ROS was negative.  Exam:   BP 118/82  Pulse 60  Resp 20  Ht 5\' 1"  (1.549 m)  Wt 159 lb 3.2 oz (72.213 kg)  BMI 30.10 kg/m2  LMP 09/29/2013  Weight change: @WEIGHTCHANGE @ Height:   Height: 5\' 1"  (154.9 cm)  Ht Readings from Last 3 Encounters:  11/25/13 5\' 1"  (1.549 m)  02/25/13 5\' 1"  (1.549 m)  01/15/13 5\' 1"  (1.549 m)    General appearance: alert, cooperative and appears stated age Head: Normocephalic, without obvious abnormality, atraumatic Neck: no adenopathy, supple, symmetrical, trachea midline and thyroid normal to inspection and palpation Lungs: clear to auscultation bilaterally Breasts: normal appearance, no masses or tenderness Heart: regular rate and rhythm Abdomen: soft, non-tender; bowel sounds normal; no masses,  no organomegaly Extremities: extremities  normal, atraumatic, no cyanosis or edema Skin: Skin color, texture, turgor normal. No rashes or lesions Lymph nodes: Cervical, supraclavicular, and axillary nodes normal. No abnormal inguinal nodes palpated Neurologic: Grossly normal   Pelvic: External genitalia:  no lesions              Urethra:  normal appearing urethra with no masses, tenderness or lesions              Bartholins and Skenes: normal                 Vagina: normal appearing vagina with normal color and discharge, no lesions              Cervix: no lesions              Pap taken: no Bimanual Exam:  Uterus:  normal size, contour, position, consistency, mobility, non-tender              Adnexa: normal adnexa and no mass, fullness, tenderness               Rectovaginal: Confirms               Anus:  normal sphincter tone, no lesions  A:  Well Woman with normal exam  PMP, with low AMH but menorrhagia on HRT  DDD  Anxiety  Elevated triglycerides.  Seeing new PCP this year as Dr. Lenna Gilford is now just doing pulmonology. Consider breast reduction.  Names given. Vasomotor symptoms.  Herbal remedies discussed.   P:   Mammogram yearly. Will schedule for pt.  Neg pap 2013 with neg HR HPV.  Neg pap last year.  No pap today.    CMP, Lipids, TSH, Vit D Testosterone level and FSH today Tdap return annually or prn   An After Visit Summary was printed and given to the patient.

## 2013-11-26 LAB — TESTOSTERONE: TESTOSTERONE: 45 ng/dL (ref 10–70)

## 2013-11-26 LAB — FOLLICLE STIMULATING HORMONE: FSH: 64.8 m[IU]/mL

## 2013-11-26 LAB — TSH: TSH: 2.9 u[IU]/mL (ref 0.350–4.500)

## 2013-11-26 LAB — VITAMIN D 25 HYDROXY (VIT D DEFICIENCY, FRACTURES): VIT D 25 HYDROXY: 50 ng/mL (ref 30–89)

## 2013-11-29 ENCOUNTER — Other Ambulatory Visit: Payer: Self-pay | Admitting: Pulmonary Disease

## 2013-12-13 ENCOUNTER — Telehealth: Payer: Self-pay

## 2013-12-13 NOTE — Telephone Encounter (Signed)
New Patient Previous PCP: Dr. Lenna Gilford  Left message for call back Identifiable  Pap- 10/27/12- negative CCS- 01/24/12- polyp(s); repeat in 2 years---Dr. Ferdinand Lango MMG- 02/08/13-negative Td- 11/25/13

## 2013-12-14 ENCOUNTER — Encounter: Payer: Self-pay | Admitting: Family Medicine

## 2013-12-14 ENCOUNTER — Ambulatory Visit (INDEPENDENT_AMBULATORY_CARE_PROVIDER_SITE_OTHER): Payer: BC Managed Care – PPO | Admitting: Family Medicine

## 2013-12-14 VITALS — BP 112/80 | HR 76 | Temp 98.1°F | Ht 60.75 in | Wt 161.6 lb

## 2013-12-14 DIAGNOSIS — R51 Headache: Secondary | ICD-10-CM

## 2013-12-14 DIAGNOSIS — R519 Headache, unspecified: Secondary | ICD-10-CM

## 2013-12-14 DIAGNOSIS — F419 Anxiety disorder, unspecified: Secondary | ICD-10-CM

## 2013-12-14 DIAGNOSIS — E781 Pure hyperglyceridemia: Secondary | ICD-10-CM

## 2013-12-14 DIAGNOSIS — F411 Generalized anxiety disorder: Secondary | ICD-10-CM

## 2013-12-14 DIAGNOSIS — M546 Pain in thoracic spine: Secondary | ICD-10-CM

## 2013-12-14 MED ORDER — FENOFIBRATE MICRONIZED 90 MG PO CAPS: ORAL_CAPSULE | ORAL | Status: DC

## 2013-12-14 MED ORDER — FLUTICASONE PROPIONATE 50 MCG/ACT NA SUSP: 2.0000 | NASAL | Status: DC | PRN

## 2013-12-14 MED ORDER — ESCITALOPRAM OXALATE 10 MG PO TABS: 10.0000 mg | ORAL_TABLET | Freq: Every day | ORAL | Status: DC

## 2013-12-14 MED ORDER — NAPROXEN 500 MG PO TABS: 500.0000 mg | ORAL_TABLET | Freq: Two times a day (BID) | ORAL | Status: DC

## 2013-12-14 MED ORDER — ALPRAZOLAM 0.5 MG PO TABS: 0.5000 mg | ORAL_TABLET | Freq: Three times a day (TID) | ORAL | Status: DC | PRN

## 2013-12-14 NOTE — Progress Notes (Signed)
Subjective:    Patient ID: Brianna Carroll, female    DOB: 1961/04/15, 53 y.o.   MRN: [id number]  HPI Pt is here to establish and she c/o sinus headache -- her gyn told her to take antihistamine that she has not started yet.  She also needs to refill her flonase.  She is also c/o pain on r mid back that is tender to touch.  She thought it was from an exercise she was doing so she stopped it and it has gotten better. Pt also c/o inc anxiety symptoms-- she wants to inc her xanax.   Her TG were also found to be high in gyn office.  She has the results with her --- they will be scanned in.    Review of Systems As above    Past Medical History  Diagnosis Date  . Allergic rhinitis   . Hearing loss   . Snoring   . Mitral valve prolapse   . Hyperlipidemia   . Low back pain syndrome   . Anxiety   . Anemia, iron deficiency   . Sinus headache    History   Social History  . Marital Status: Married    Spouse Name: [name]    Number of Children: 2  . Years of Education: N/A   Occupational History  . accounting    Social History Main Topics  . Smoking status: Never Smoker   . Smokeless tobacco: Never Used  . Alcohol Use: Yes     Comment: rarely  . Drug Use: No  . Sexual Activity: Yes    Partners: Male    Birth Control/ Protection: Surgical     Comment: BTL   Other Topics Concern  . Not on file   Social History Narrative  . No narrative on file   Current Outpatient Prescriptions  Medication Sig Dispense Refill  . ALPRAZolam (XANAX) 0.5 MG tablet Take 1 tablet (0.5 mg total) by mouth 3 (three) times daily as needed for anxiety.  60 tablet  1  . conjugated estrogens (PREMARIN) vaginal cream 1/2 gram vaginally twice weekly  42.5 g  1  . fluticasone (FLONASE) 50 MCG/ACT nasal spray Place 2 sprays into both nostrils as needed.  16 g  11  . naproxen (NAPROSYN) 500 MG tablet Take 1 tablet (500 mg total) by mouth 2 (two) times daily with a meal.  60 tablet  2  . simvastatin (ZOCOR)  40 MG tablet TAKE ONE TABLET BY MOUTH AT BEDTIME  30 tablet  0  . traMADol (ULTRAM) 50 MG tablet TAKE ONE TABLET BY MOUTH THREE TIMES DAILY AS NEEDED FOR PAIN  90 tablet  5  . escitalopram (LEXAPRO) 10 MG tablet Take 1 tablet (10 mg total) by mouth at bedtime.  30 tablet  5  . Fenofibrate Micronized (ANTARA) 90 MG CAPS 1 po qd  30 capsule  5   No current facility-administered medications for this visit.   Family History  Problem Relation Age of Onset  . Cancer Father     Lung   . Cancer Mother     Lung Cancer  . Cancer Brother     throat cancer-heavy smoker  . COPD Sister     Objective:   Physical Exam BP 112/80  Pulse 76  Temp(Src) 98.1 F (36.7 C) (Oral)  Ht 5' 0.75" (1.543 m)  Wt 161 lb 9.6 oz (73.301 kg)  BMI 30.79 kg/m2  SpO2 98%  LMP 09/29/2013 General appearance: alert, cooperative, appears stated  age and no distress Head: Normocephalic, without obvious abnormality, atraumatic Ears: normal TM's and external ear canals both ears Nose: Nares normal. Septum midline. Mucosa normal. No drainage or sinus tenderness. Throat: lips, mucosa, and tongue normal; teeth and gums normal Neck: no adenopathy, no carotid bruit, no JVD, supple, symmetrical, trachea midline and thyroid not enlarged, symmetric, no tenderness/mass/nodules Back: tenderness R flank with palp between ribs Lungs: clear to auscultation bilaterally Heart: S1, S2 normal Lymph nodes: Cervical, supraclavicular, and axillary nodes normal. Neurologic: Alert and oriented X 3, normal strength and tone. Normal symmetric reflexes. Normal coordination and gait        Assessment & Plan:  1. Hypertriglyceridemia Labs reviewed from gyn-- recheck 3 months Diet and exercise - Fenofibrate Micronized (ANTARA) 90 MG CAPS; 1 po qd  Dispense: 30 capsule; Refill: 5  2. Sinus headache claritin and flonase - fluticasone (FLONASE) 50 MCG/ACT nasal spray; Place 2 sprays into both nostrils as needed.  Dispense: 16 g; Refill:  11  3. Anxiety  - ALPRAZolam (XANAX) 0.5 MG tablet; Take 1 tablet (0.5 mg total) by mouth 3 (three) times daily as needed for anxiety.  Dispense: 60 tablet; Refill: 1 - escitalopram (LEXAPRO) 10 MG tablet; Take 1 tablet (10 mg total) by mouth at bedtime.  Dispense: 30 tablet; Refill: 5  4. Right-sided thoracic back pain  - naproxen (NAPROSYN) 500 MG tablet; Take 1 tablet (500 mg total) by mouth 2 (two) times daily with a meal.  Dispense: 60 tablet; Refill: 2

## 2013-12-14 NOTE — Progress Notes (Signed)
Pre visit review using our clinic review tool, if applicable. No additional management support is needed unless otherwise documented below in the visit note. 

## 2013-12-14 NOTE — Telephone Encounter (Signed)
New Patient Previous PCP:  Dr. Lenna Gilford  Medication and allergies:  Reviewed and updated  90 day supply/mail order: n/a Local pharmacy:  [facility] [place], [place] - [address]   Immunizations due: UTD   A/P: No changes to personal, family history or past surgical hx Pap- 10/27/12- negative  CCS- 01/24/12- polyp(s); repeat in 2 years---Dr. Ferdinand Lango  MMG- 02/08/13-negative  Td- 11/25/13  To Discuss with Provider: Elevated Cholesterol--higher than its every been; states she's eating healthy, exercising and taking simvastatin every day. Other concerns regarding her lab work.   Would like to discuss weight loss options Needs refill:  Flonase and naproxen

## 2013-12-14 NOTE — Patient Instructions (Signed)
Triglycerides, TG, TRIG This is a test to check your risk of developing heart disease. It is often done as part of a lipid profile during a regular medical exam or if you are being treated for high triglycerides. This test measures the amount of triglycerides in your blood. Triglycerides are the body's storage form for fat. Most triglycerides are found in fat tissue. Some triglycerides circulate in the blood to provide fuel for muscles to work. Extra triglycerides are found in the blood after eating a meal when fat is being sent from the gut to fat tissue for storage. The test for triglycerides should be done when you are fasting and no extra triglycerides from a recent meal are present.  SAMPLE COLLECTION The test for triglycerides uses a blood sample. Most often, the blood sample is collected using a needle to collect blood from a vein. Sometimes triglycerides are measured using a drop of blood collected by puncturing the skin on a finger. Testing should be done when you are fasting. For 12 to 14 hours before the test, only water is permitted. In addition, alcohol should not be consumed for the 24 hours just before the test. If you are diabetic and your blood sugar is out of control, triglycerides will be very high. NORMAL FINDINGS  Adult/elderly  Female: 40-160 mg/dL or 0.45-1.81 mmol/L (SI units)  Female: 35-135 mg/dL or 0.40-1.52 mmol/L (SI units)  0-54 years  Female: 30-86 mg/dL  Female: 32-99 mg/dL  6-54 years  Female: 31-108 mg/dL  Female: 35-114 mg/dL  12-54 years  Female: 36-138 mg/dL  Female: 41-138 mg/dL  16-54 years  Female: 40-163 mg/dL  Female: 40-128 mg/dL Ranges for normal findings may vary among different laboratories and hospitals. You should always check with your doctor after having lab work or other tests done to discuss the meaning of your test results and whether your values are considered within normal limits. MEANING OF TEST  Your caregiver will go over the test  results with you and discuss the importance and meaning of your results, as well as treatment options and the need for additional tests if necessary. OBTAINING THE TEST RESULTS It is your responsibility to obtain your test results. Ask the lab or department performing the test when and how you will get your results. Document Released: 07/06/2004 Document Revised: 08/26/2011 Document Reviewed: 05/15/2008 [facility] Patient Information 2015 [place], [place]. This information is not intended to replace advice given to you by your health care provider. Make sure you discuss any questions you have with your health care provider.

## 2014-01-03 ENCOUNTER — Other Ambulatory Visit: Payer: Self-pay | Admitting: Pulmonary Disease

## 2014-01-04 ENCOUNTER — Other Ambulatory Visit: Payer: Self-pay | Admitting: *Deleted

## 2014-01-04 MED ORDER — SIMVASTATIN 40 MG PO TABS: ORAL_TABLET | ORAL | Status: DC

## 2014-01-04 NOTE — Telephone Encounter (Signed)
Rx sent to the pharmacy by e-script.//AB/CMA 

## 2014-01-05 ENCOUNTER — Other Ambulatory Visit: Payer: Self-pay

## 2014-01-05 DIAGNOSIS — Z1231 Encounter for screening mammogram for malignant neoplasm of breast: Secondary | ICD-10-CM

## 2014-02-09 ENCOUNTER — Ambulatory Visit
Admission: RE | Admit: 2014-02-09 | Discharge: 2014-02-09 | Disposition: A | Discharge status: Home / Self Care | Payer: BC Managed Care – PPO | Source: Ambulatory Visit

## 2014-02-09 DIAGNOSIS — Z1231 Encounter for screening mammogram for malignant neoplasm of breast: Secondary | ICD-10-CM

## 2014-02-24 ENCOUNTER — Encounter: Payer: Self-pay | Admitting: Family Medicine

## 2014-04-01 ENCOUNTER — Other Ambulatory Visit: Payer: Self-pay

## 2014-04-14 ENCOUNTER — Telehealth: Payer: Self-pay

## 2014-04-14 DIAGNOSIS — F419 Anxiety disorder, unspecified: Secondary | ICD-10-CM

## 2014-04-14 MED ORDER — ALPRAZOLAM 0.5 MG PO TABS: 0.5000 mg | ORAL_TABLET | Freq: Three times a day (TID) | ORAL | Status: DC | PRN

## 2014-04-14 NOTE — Telephone Encounter (Signed)
Xanax requested  Last seen and filled 12/14/13 #60 with 1 refill.   Please advise      KP

## 2014-04-14 NOTE — Telephone Encounter (Signed)
Refill x1, 1 refill 

## 2014-04-18 ENCOUNTER — Encounter: Payer: Self-pay | Admitting: Family Medicine

## 2014-07-04 ENCOUNTER — Telehealth: Payer: Self-pay | Admitting: Family Medicine

## 2014-07-04 DIAGNOSIS — E781 Pure hyperglyceridemia: Secondary | ICD-10-CM

## 2014-07-04 NOTE — Telephone Encounter (Signed)
She needs an OV with Fasting labs.     KP

## 2014-07-04 NOTE — Telephone Encounter (Signed)
Requesting simvastatin as well

## 2014-07-04 NOTE — Telephone Encounter (Signed)
Caller name: Atiya  Relation to pt: self Call back number: [phone] Pharmacy:  [facility] in [place] on [address]  Reason for call:   Requesting refill of Fenofibrate Micronized (ANTARA)

## 2014-07-04 NOTE — Telephone Encounter (Signed)
Left message for patient to return my call.

## 2014-07-05 MED ORDER — FENOFIBRATE MICRONIZED 90 MG PO CAPS: ORAL_CAPSULE | ORAL | Status: DC

## 2014-07-05 MED ORDER — SIMVASTATIN 40 MG PO TABS: ORAL_TABLET | ORAL | Status: DC

## 2014-07-05 NOTE — Telephone Encounter (Signed)
Patient scheduled appointment for 2/16 and would like to know if she can have refills to last her until her appointment? She is also requesting antara?

## 2014-07-05 NOTE — Telephone Encounter (Signed)
30 Day supply sent.       KP

## 2014-08-02 ENCOUNTER — Telehealth: Payer: Self-pay | Admitting: Obstetrics & Gynecology

## 2014-08-02 ENCOUNTER — Encounter: Payer: Self-pay | Admitting: Family Medicine

## 2014-08-02 ENCOUNTER — Ambulatory Visit (INDEPENDENT_AMBULATORY_CARE_PROVIDER_SITE_OTHER): Payer: BC Managed Care – PPO | Admitting: Family Medicine

## 2014-08-02 VITALS — BP 114/80 | HR 71 | Temp 98.7°F | Wt 170.0 lb

## 2014-08-02 DIAGNOSIS — F411 Generalized anxiety disorder: Secondary | ICD-10-CM

## 2014-08-02 DIAGNOSIS — F419 Anxiety disorder, unspecified: Secondary | ICD-10-CM

## 2014-08-02 DIAGNOSIS — E785 Hyperlipidemia, unspecified: Secondary | ICD-10-CM

## 2014-08-02 DIAGNOSIS — J309 Allergic rhinitis, unspecified: Secondary | ICD-10-CM | POA: Insufficient documentation

## 2014-08-02 DIAGNOSIS — J3089 Other allergic rhinitis: Secondary | ICD-10-CM | POA: Insufficient documentation

## 2014-08-02 DIAGNOSIS — R0683 Snoring: Secondary | ICD-10-CM

## 2014-08-02 DIAGNOSIS — M549 Dorsalgia, unspecified: Secondary | ICD-10-CM

## 2014-08-02 DIAGNOSIS — E781 Pure hyperglyceridemia: Secondary | ICD-10-CM

## 2014-08-02 DIAGNOSIS — M546 Pain in thoracic spine: Secondary | ICD-10-CM

## 2014-08-02 DIAGNOSIS — J302 Other seasonal allergic rhinitis: Secondary | ICD-10-CM

## 2014-08-02 LAB — HEPATIC FUNCTION PANEL
ALT: 17 U/L (ref 0–35)
AST: 18 U/L (ref 0–37)
Albumin: 4.2 g/dL (ref 3.5–5.2)
Alkaline Phosphatase: 60 U/L (ref 39–117)
BILIRUBIN DIRECT: 0 mg/dL (ref 0.0–0.3)
BILIRUBIN TOTAL: 0.4 mg/dL (ref 0.2–1.2)
TOTAL PROTEIN: 6.8 g/dL (ref 6.0–8.3)

## 2014-08-02 LAB — BASIC METABOLIC PANEL
BUN: 14 mg/dL (ref 6–23)
CO2: 30 mEq/L (ref 19–32)
Calcium: 9.8 mg/dL (ref 8.4–10.5)
Chloride: 106 mEq/L (ref 96–112)
Creatinine, Ser: 0.94 mg/dL (ref 0.40–1.20)
GFR: 65.91 mL/min (ref >60.00)
Glucose, Bld: 92 mg/dL (ref 70–99)
POTASSIUM: 4.5 meq/L (ref 3.5–5.1)
SODIUM: 140 meq/L (ref 135–145)

## 2014-08-02 LAB — LIPID PANEL
Cholesterol: 145 mg/dL (ref 0–200)
HDL: 46.7 mg/dL (ref >39.00)
LDL CALC: 73 mg/dL (ref 0–99)
NonHDL: 98.3
Total CHOL/HDL Ratio: 3
Triglycerides: 128 mg/dL (ref 0.0–149.0)
VLDL: 25.6 mg/dL (ref 0.0–40.0)

## 2014-08-02 MED ORDER — SIMVASTATIN 40 MG PO TABS: ORAL_TABLET | ORAL | Status: DC

## 2014-08-02 MED ORDER — ALPRAZOLAM 0.5 MG PO TABS: 0.5000 mg | ORAL_TABLET | Freq: Three times a day (TID) | ORAL | Status: DC | PRN

## 2014-08-02 MED ORDER — NAPROXEN 500 MG PO TABS: 500.0000 mg | ORAL_TABLET | Freq: Two times a day (BID) | ORAL | Status: DC

## 2014-08-02 MED ORDER — FENOFIBRATE MICRONIZED 90 MG PO CAPS: ORAL_CAPSULE | ORAL | Status: DC

## 2014-08-02 NOTE — Telephone Encounter (Signed)
Pt states she spoke with Dr Sabra Heck last year regarding a medically necessary breast reduction. Pt states she can't remember name of provider Dr Sabra Heck highly recommended. Pt would like to schedule a consult for a reduction and asks for name and phone number if available.

## 2014-08-02 NOTE — Assessment & Plan Note (Signed)
Probably from large breasts Pt will consult with a plastic surgeon

## 2014-08-02 NOTE — Telephone Encounter (Signed)
Spoke with patient. Advised patient Dr.Miller highly recommends Dr.Barber at [phone] or Dr.Bowers at [phone]. Patient is agreeable and will call to schedule appointment.  Routing to provider for final review. Patient agreeable to disposition. Will close encounter

## 2014-08-02 NOTE — Assessment & Plan Note (Signed)
Stable--- uses xanax 1x a day

## 2014-08-02 NOTE — Assessment & Plan Note (Signed)
Cont zocor and antara

## 2014-08-02 NOTE — Assessment & Plan Note (Signed)
Zyrtec or other antihistamine Steroid nasal spray rto prn

## 2014-08-02 NOTE — Progress Notes (Signed)
Pre visit review using our clinic review tool, if applicable. No additional management support is needed unless otherwise documented below in the visit note. 

## 2014-08-02 NOTE — Progress Notes (Signed)
Subjective:    Patient ID: Brianna Carroll, female    DOB: Jan 12, 1961, 54 y.o.   MRN: [id number]  HPI  Patient here for f/u anxiety/depression and cholesterol.  She is c/o mid back pain and gyn told her it was from her breasts.  She would like to go to a Psychiatric nurse for consultation.  She is is also c/o snoring and daytime fatigue.  She states her husband states it is getting worse.  Dr Leeanne Deed wanted to do a sleep study on her in the past but she was not able to have it done at that time.  Pt is requesting a referral for an evaluation for sleep apnea.    Past Medical History  Diagnosis Date  . Allergic rhinitis   . Hearing loss   . Snoring   . Mitral valve prolapse   . Hyperlipidemia   . Low back pain syndrome   . Anxiety   . Anemia, iron deficiency   . Sinus headache     Review of Systems  Constitutional: Positive for fatigue. Negative for activity change, appetite change and unexpected weight change.  HENT: Positive for congestion, postnasal drip and rhinorrhea.   Respiratory: Negative for cough, shortness of breath and wheezing.   Cardiovascular: Negative for chest pain and palpitations.  Genitourinary: Negative for genital sores.  Musculoskeletal: Positive for back pain. Negative for myalgias, joint swelling, arthralgias, gait problem, neck pain and neck stiffness.  Psychiatric/Behavioral: Negative for suicidal ideas, hallucinations, behavioral problems, confusion, sleep disturbance, self-injury, dysphoric mood, decreased concentration and agitation. The patient is not nervous/anxious and is not hyperactive.        Objective:    Physical Exam  Constitutional: [name], place, and time. She appears well-developed and well-nourished. No distress.  HENT:  Right Ear: External ear normal.  Left Ear: External ear normal.  Nose: Rhinorrhea present. Right sinus exhibits no maxillary sinus tenderness and no frontal sinus tenderness. Left sinus exhibits no  maxillary sinus tenderness and no frontal sinus tenderness.  Mouth/Throat: Oropharynx is clear and moist and mucous membranes are normal. No oropharyngeal exudate, posterior oropharyngeal edema or posterior oropharyngeal erythema.  Eyes: EOM are normal. Pupils are equal, round, and reactive to light.  Neck: Normal range of motion. Neck supple.  Cardiovascular: Normal rate, regular rhythm and normal heart sounds.   No murmur heard. Pulmonary/Chest: Effort normal and breath sounds normal. No respiratory distress. She has no wheezes. She has no rales. She exhibits no tenderness.  Neurological: She is alert and oriented to person, place, and time.  Psychiatric: She has a normal mood and affect. Her behavior is normal. Judgment and thought content normal.  Nursing note and vitals reviewed.   BP 114/80 mmHg  Pulse 71  Temp(Src) 98.7 F (37.1 C) (Oral)  Wt 170 lb (77.111 kg)  SpO2 97% Wt Readings from Last 3 Encounters:  08/02/14 170 lb (77.111 kg)  12/14/13 161 lb 9.6 oz (73.301 kg)  11/25/13 159 lb 3.2 oz (72.213 kg)     Lab Results  Component Value Date   WBC 7.4 10/23/2011   HGB 14.5 10/23/2011   HCT 43.1 10/23/2011   PLT 317.0 10/23/2011   GLUCOSE 84 11/25/2013   CHOL 201* 11/25/2013   TRIG 323* 11/25/2013   HDL 52 11/25/2013   LDLDIRECT 94.4 10/23/2011   LDLCALC 84 11/25/2013   ALT 41* 11/25/2013   AST 28 11/25/2013   NA 140 11/25/2013   K 4.5 11/25/2013   CL  104 11/25/2013   CREATININE 0.77 11/25/2013   BUN 11 11/25/2013   CO2 25 11/25/2013   TSH 2.900 11/25/2013    Mm Digital Screening Bilateral  02/09/2014   CLINICAL DATA:  Screening.  EXAM: DIGITAL SCREENING BILATERAL MAMMOGRAM WITH CAD  COMPARISON:  Previous exam(s).  ACR Breast Density Category b: There are scattered areas of fibroglandular density.  FINDINGS: There are no findings suspicious for malignancy. Images were processed with CAD.  IMPRESSION: No mammographic evidence of malignancy. A result letter of  this screening mammogram will be mailed directly to the patient.  RECOMMENDATION: Screening mammogram in one year. (Code:SM-B-01Y)  BI-RADS CATEGORY  1: Negative.   Electronically Signed   By: Luberta Robertson M.D.   On: 02/09/2014 14:40       Assessment & Plan:   Problem List Items Addressed This Visit    Mid back pain    Probably from large breasts Pt will consult with a plastic surgeon      Relevant Medications   naproxen (NAPROSYN) tablet   Hyperlipidemia LDL goal <100    Cont zocor and antara       Relevant Medications   simvastatin (ZOCOR) tablet   Fenofibrate Micronized (ANTARA) 90 MG CAPS   Generalized anxiety disorder    Stable--- uses xanax 1x a day      Allergic rhinitis    Other Visit Diagnoses    Hypertriglyceridemia    -  Primary    Relevant Medications    simvastatin (ZOCOR) tablet    Fenofibrate Micronized (ANTARA) 90 MG CAPS    Anxiety        Relevant Medications    ALPRAZolam (XANAX) tablet    Right-sided thoracic back pain        Relevant Medications    naproxen (NAPROSYN) tablet    Snoring        Relevant Orders    Ambulatory referral to Pulmonology    Hyperlipidemia        Relevant Medications    simvastatin (ZOCOR) tablet    Fenofibrate Micronized (ANTARA) 90 MG CAPS    Other Relevant Orders    Basic metabolic panel    Hepatic function panel    Lipid panel        Garnet Koyanagi, DO

## 2014-08-02 NOTE — Patient Instructions (Signed)

## 2014-08-03 ENCOUNTER — Telehealth: Payer: Self-pay | Admitting: Family Medicine

## 2014-08-03 DIAGNOSIS — E781 Pure hyperglyceridemia: Secondary | ICD-10-CM

## 2014-08-03 MED ORDER — FENOFIBRATE MICRONIZED 90 MG PO CAPS: ORAL_CAPSULE | ORAL | Status: DC

## 2014-08-03 NOTE — Telephone Encounter (Signed)
Rx has been sent for a 90 day supply.    KP

## 2014-08-03 NOTE — Telephone Encounter (Signed)
Caller name:Olene Relation to pt: self Call back number: [phone] [facility] [place]  Reason for call:   Patient states that she received her last labs results and is requesting that Dr. Etter Sjogren changes Antera to 90 day supply. This was only medication that was on hold she says. Is requesting all meds to be 90 day

## 2014-08-03 NOTE — Telephone Encounter (Signed)
Patient has been made aware.      KP 

## 2014-08-04 ENCOUNTER — Telehealth: Payer: Self-pay

## 2014-08-04 DIAGNOSIS — E781 Pure hyperglyceridemia: Secondary | ICD-10-CM

## 2014-08-04 MED ORDER — FENOFIBRATE MICRONIZED 90 MG PO CAPS: ORAL_CAPSULE | ORAL | Status: DC

## 2014-08-04 NOTE — Telephone Encounter (Signed)
Rx re-faxed    KP 

## 2014-08-04 NOTE — Telephone Encounter (Signed)
-----   Message from Oneta Rack sent at 08/04/2014 12:24 PM EST ----- Regarding: Medication Managment  Contact: [phone] Pt states pharmacy did not receive the new request for 90 day supply for Fenofibrate Micronized (ANTARA) 90 MG CAPS.   [facility] - [place], [place] [id number] (Phone) [phone] (Fax)

## 2014-08-26 ENCOUNTER — Other Ambulatory Visit: Payer: Self-pay

## 2014-08-26 DIAGNOSIS — Z1231 Encounter for screening mammogram for malignant neoplasm of breast: Secondary | ICD-10-CM

## 2014-09-16 ENCOUNTER — Other Ambulatory Visit: Payer: Self-pay | Admitting: *Deleted

## 2014-09-16 DIAGNOSIS — F419 Anxiety disorder, unspecified: Secondary | ICD-10-CM

## 2014-09-16 MED ORDER — ALPRAZOLAM 0.5 MG PO TABS: 0.5000 mg | ORAL_TABLET | Freq: Three times a day (TID) | ORAL | Status: DC | PRN

## 2014-09-16 NOTE — Telephone Encounter (Signed)
Rx printed and faxed to the pharmacy.//AB/CMA 

## 2014-09-22 ENCOUNTER — Institutional Professional Consult (permissible substitution): Payer: BC Managed Care – PPO | Admitting: Pulmonary Disease

## 2014-12-16 ENCOUNTER — Encounter: Payer: Self-pay | Admitting: Obstetrics & Gynecology

## 2014-12-16 ENCOUNTER — Ambulatory Visit (INDEPENDENT_AMBULATORY_CARE_PROVIDER_SITE_OTHER): Payer: BC Managed Care – PPO | Admitting: Obstetrics & Gynecology

## 2014-12-16 VITALS — BP 120/72 | HR 60 | Resp 16 | Ht 60.75 in | Wt 165.0 lb

## 2014-12-16 DIAGNOSIS — Z01419 Encounter for gynecological examination (general) (routine) without abnormal findings: Secondary | ICD-10-CM | POA: Diagnosis not present

## 2014-12-16 DIAGNOSIS — Z124 Encounter for screening for malignant neoplasm of cervix: Secondary | ICD-10-CM

## 2014-12-16 DIAGNOSIS — Z0000 Encounter for general adult medical examination without abnormal findings: Secondary | ICD-10-CM | POA: Diagnosis not present

## 2014-12-16 LAB — POCT URINALYSIS DIPSTICK
Bilirubin, UA: NEGATIVE
Glucose, UA: NEGATIVE
Ketones, UA: NEGATIVE
Nitrite, UA: NEGATIVE
PH UA: 5
PROTEIN UA: NEGATIVE
UROBILINOGEN UA: NEGATIVE

## 2014-12-16 NOTE — Progress Notes (Signed)
54 y.o. G2P2 MarriedCaucasianF here for annual exam.  Doing well.  No vaginal bleeding.  H/o elevated triglycerides.  Has been on some different medications but now fenofibrate and this has worked really well.  Last value was normal.  She is doing well with the medication.  Very pleased with results.    Has complaint of vaginal pain with intercourse.  Have been prescribed vaginal estrogen cream Patient's last menstrual period was 09/15/2013.          Sexually active: Yes.    The current method of family planning is tubal ligation.    Exercising: Yes.    walking Smoker:  no  Health Maintenance: Pap:  10/27/12 WNL, neg HR HPV 4/13 History of abnormal Pap:  no MMG:  02/09/14-normal Colonoscopy:  8/13-repeat in 2 years (inadequate prep), aware will be due BMD:   none TDaP:  2015 PCP Screening Labs: PCP, Hb today: PCP, Urine today: WBC-trace, RBC-trace   reports that she has never smoked. She has never used smokeless tobacco. She reports that she drinks about 0.6 oz of alcohol per week. She reports that she does not use illicit drugs.  Past Medical History  Diagnosis Date  . Allergic rhinitis   . Hearing loss   . Snoring   . Mitral valve prolapse   . Hyperlipidemia   . Low back pain syndrome   . Anxiety   . Anemia, iron deficiency   . Sinus headache     Past Surgical History  Procedure Laterality Date  . Tubal ligation  05/30/94  . Cesarean section  11/90, 12/95  . Lumbar disc surgery  2004    Current Outpatient Prescriptions  Medication Sig Dispense Refill  . ALPRAZolam (XANAX) 0.5 MG tablet Take 1 tablet (0.5 mg total) by mouth 3 (three) times daily as needed for anxiety. 60 tablet 1  . Fenofibrate Micronized (ANTARA) 90 MG CAPS 1 po qd 90 capsule 1  . fluticasone (FLONASE) 50 MCG/ACT nasal spray Place 2 sprays into both nostrils as needed. 16 g 11  . naproxen (NAPROSYN) 500 MG tablet Take 1 tablet (500 mg total) by mouth 2 (two) times daily with a meal. 180 tablet 1  .  simvastatin (ZOCOR) 40 MG tablet TAKE 1 TABLET BY MOUTH AT BEDTIME. 90 tablet 1  . traMADol (ULTRAM) 50 MG tablet TAKE ONE TABLET BY MOUTH THREE TIMES DAILY AS NEEDED FOR PAIN 90 tablet 5   No current facility-administered medications for this visit.    Family History  Problem Relation Age of Onset  . Cancer Father     Lung   . Cancer Mother     Lung Cancer  . Cancer Brother     throat cancer-heavy smoker  . COPD Sister     ROS:  Pertinent items are noted in HPI.  Otherwise, a comprehensive ROS was negative.  Exam:   General appearance: alert, cooperative and appears stated age Head: Normocephalic, without obvious abnormality, atraumatic Neck: no adenopathy, supple, symmetrical, trachea midline and thyroid normal to inspection and palpation Lungs: clear to auscultation bilaterally Breasts: normal appearance, no masses or tenderness Heart: regular rate and rhythm Abdomen: soft, non-tender; bowel sounds normal; no masses,  no organomegaly Extremities: extremities normal, atraumatic, no cyanosis or edema Skin: Skin color, texture, turgor normal. No rashes or lesions Lymph nodes: Cervical, supraclavicular, and axillary nodes normal. No abnormal inguinal nodes palpated Neurologic: Grossly normal   Pelvic: External genitalia:  no lesions  Urethra:  normal appearing urethra with no masses, tenderness or lesions              Bartholins and Skenes: normal                 Vagina: normal appearing vagina with normal color and discharge, no lesions              Cervix: no lesions              Pap taken: Yes.   Bimanual Exam:  Uterus:  normal size, contour, position, consistency, mobility, non-tender              Adnexa: normal adnexa and no mass, fullness, tenderness               Rectovaginal: Confirms               Anus:  declines  Chaperone was present for exam.  A:  Well Woman with normal exam PMP DDD Anxiety  Elevated triglycerides  P: Mammogram  yearly. Will schedule for pt. pap smear only today. Neg HR HPV 4/13 Labs with Dr. Etter Sjogren Aware colonoscopy is due.  States will schedule. return annually or prn

## 2014-12-20 LAB — IPS PAP TEST WITH REFLEX TO HPV

## 2015-01-31 ENCOUNTER — Other Ambulatory Visit: Payer: Self-pay | Admitting: Family Medicine

## 2015-01-31 DIAGNOSIS — F419 Anxiety disorder, unspecified: Secondary | ICD-10-CM

## 2015-01-31 DIAGNOSIS — E785 Hyperlipidemia, unspecified: Secondary | ICD-10-CM

## 2015-01-31 DIAGNOSIS — E781 Pure hyperglyceridemia: Secondary | ICD-10-CM

## 2015-01-31 NOTE — Telephone Encounter (Signed)
Last seen 08/02/14 and filled 09/16/14 #60 with 1 refill.   Please advise     KP

## 2015-02-01 MED ORDER — SIMVASTATIN 40 MG PO TABS: ORAL_TABLET | ORAL | Status: DC

## 2015-02-01 MED ORDER — ALPRAZOLAM 0.5 MG PO TABS: 0.5000 mg | ORAL_TABLET | Freq: Three times a day (TID) | ORAL | Status: DC | PRN

## 2015-02-01 NOTE — Addendum Note (Signed)
Addended by: Ewing Schlein on: 02/01/2015 09:23 AM   Modules accepted: Orders

## 2015-02-01 NOTE — Telephone Encounter (Signed)
Rx faxed.    KP 

## 2015-02-02 ENCOUNTER — Telehealth: Payer: Self-pay | Admitting: Obstetrics & Gynecology

## 2015-02-02 ENCOUNTER — Telehealth: Payer: Self-pay | Admitting: Family Medicine

## 2015-02-02 NOTE — Telephone Encounter (Signed)
Message left advising Rx has been faxed to the pharmacy and it is ready for pick up.      KP

## 2015-02-02 NOTE — Telephone Encounter (Signed)
Relation to pt: self  Call back number: [phone]   Reason for call:  Patient spoke with pharmacy today and stated ALPRAZolam Duanne Moron) 0.5 MG tablet was denied.

## 2015-02-13 ENCOUNTER — Ambulatory Visit
Admission: RE | Admit: 2015-02-13 | Discharge: 2015-02-13 | Disposition: A | Discharge status: Home / Self Care | Payer: BC Managed Care – PPO | Source: Ambulatory Visit

## 2015-02-13 DIAGNOSIS — Z1231 Encounter for screening mammogram for malignant neoplasm of breast: Secondary | ICD-10-CM

## 2015-03-03 NOTE — Telephone Encounter (Signed)
Close encounter 

## 2015-03-17 ENCOUNTER — Other Ambulatory Visit: Payer: Self-pay | Admitting: Family Medicine

## 2015-03-17 NOTE — Telephone Encounter (Signed)
Last seen 08/02/14 and filled 02/01/15 #60 UDS 08/02/14 low risk    Please advise     KP

## 2015-05-05 ENCOUNTER — Other Ambulatory Visit: Payer: Self-pay | Admitting: Family Medicine

## 2015-05-22 ENCOUNTER — Telehealth: Payer: Self-pay | Admitting: Family Medicine

## 2015-05-22 NOTE — Telephone Encounter (Signed)
Pt states she doesn't feel bad enough to go to ER.  Did not sound to be in acute distress over the phone.    C/o:  "Slightly feverish," nasal congestion (breathing through mouth), "swimming headache," and mild chest tightness since this morning.  States she had similar symptoms back in 2013--Upper Resp. Infection.  Rated headache 8/10. Took an Advil around lunch, but symptoms not relieved.  Symptoms not worse, but not any better either.  No cough, nausea, arm pain, jaw pain, diaphoresis, changes in vision.    BP: 113/79, HR: 74    Advise:  Offered appt within [facility].  Pt declined stating she would like to see doctor within our facility.  Currently no openings available.  Pt was encouraged to be seen today.  Pt declined, stating she's going to go home and take some cold medication, if not feeling better, she's going to go to Urgent Care.     Pt says she's really disappointed that no one can fit her in today.  She says that this was never an issue at Dr. Jeannine Kitten office.  I apologized and again offered an appt at another facility, pt declined. Stating again that she's going to take some cold medication and if not better will go to Urgent Care.

## 2015-05-22 NOTE — Telephone Encounter (Signed)
[place]  Patient Name: Brianna Carroll  Gender: Female  DOB: 1961-04-20   Age: 54 Y 11 M 26 D  Return Phone Number: [phone] (Primary)  Address:   City/State/Zip: [place]    Client [facility] - Client  Client Site [facility] - Day  Physician Garnet Koyanagi   Contact Type Call  Call Type Triage / Clinical  Caller Name Zyaira Loges  Relationship To Patient Self  Appointment Disposition EMR Appointment Not Necessary  Info pasted into Epic Yes  Return Phone Number [phone] (Primary)  Chief Complaint CHEST PAIN (>=21 years) - pain, pressure, heaviness or tightness  Initial Comment Caller states she is having chest tightness and she's having a hard time getting her breath and she has a headache.   PreDisposition InappropriateToAsk   Nurse Assessment  Nurse: Genoveva Ill, RN, Lattie Haw Date/Time (Eastern Time): 05/22/2015 3:14:15 PM  Confirm and document reason for call. If symptomatic, describe symptoms. ---Caller states she is having chest tightness, but no cough and she's having a hard time getting her breath, headache and body aches  Has the patient traveled out of the country within the last 30 days? ---Not Applicable  Does the patient have any new or worsening symptoms? ---Yes  Will a triage be completed? ---Yes  Related visit to physician within the last 2 weeks? ---No  Does the PT have any chronic conditions? (i.e. diabetes, asthma, etc.) ---Yes  List chronic conditions. ---high cholesterol  Did the patient indicate they were pregnant? ---No  Is this a behavioral health or substance abuse call? ---No     Guidelines      Guideline Title Affirmed Question Affirmed Notes Nurse Date/Time Eilene Ghazi Time)  Chest Pain [1] Chest pain lasts > 5 minutes AND [2] age > 75  Burress, RN, Lattie Haw 05/22/2015 3:15:32 PM   Disp. Time Eilene Ghazi Time) Disposition Final User   05/22/2015 3:09:48 PM Send to [place], Eric   05/22/2015 3:39:16 PM 911 Outcome Documentation  Burress, RN, Lattie Haw    Reason: refused calling 911 and wants to be seen in office      05/22/2015 3:24:43 PM Call EMS 911 Now Yes Burress, RN, Leland Johns Understands: Yes  Disagree/Comply: Disagree  Disagree/Comply Reason: Disagree with instructions   Care Advice Given Per Guideline      CALL EMS 911 NOW: Immediate medical attention is needed. You need to hang up and call 911 (or an ambulance). Psychologist, forensic Discretion: I'll call you back in a few minutes to be sure you were able to reach them.) CARE ADVICE given per Chest Pain (Adult) guideline.   After Care Instructions Given     Call Event Type User Date / Time Description        Comments  User: Margaretha Sheffield, RN Date/Time Eilene Ghazi Time): 05/22/2015 3:27:21 PM  caller will not call 911 or go to ED; reinforced recommendation and advised someone will call her back, per directive; states if no one has called by 5 pm, she may go to UC, but wants to be seen in office. **Called office and was disconnected  User: Margaretha Sheffield, RN Date/Time Eilene Ghazi Time): 05/22/2015 3:28:44 PM  called office and selected dr office or facility and no one answered after ringing for 4 minutes  User: Margaretha Sheffield, RN Date/Time Eilene Ghazi Time): 05/22/2015 3:32:06 PM  called office and was disconnected after 2 minutes , no one  came on line  User: Margaretha Sheffield, RN Date/Time Eilene Ghazi Time): 05/22/2015 3:37:26 PM  Reached Tiffany at office to report; agrees to have someone call caller   Referrals  [place] REFUSED

## 2015-05-22 NOTE — Telephone Encounter (Signed)
Agree that pt needs to be seen.  I am sorry that we were full in this office but another [facility] provider could have seen her- which would have saved her the trip to UC (and they would have been able to see her history).

## 2015-05-23 NOTE — Telephone Encounter (Signed)
Called to follow up with patient.  Left a message for call back.   

## 2015-05-24 ENCOUNTER — Telehealth: Payer: Self-pay | Admitting: *Deleted

## 2015-05-24 MED ORDER — ALPRAZOLAM 0.5 MG PO TABS: 0.5000 mg | ORAL_TABLET | Freq: Three times a day (TID) | ORAL | Status: DC | PRN

## 2015-05-24 NOTE — Telephone Encounter (Signed)
Rx faxed to pharmacy  

## 2015-05-24 NOTE — Telephone Encounter (Signed)
Received fax from [facility] requesting refill of Xanax. Last Rx 03/17/15, #60.  Rx printed and forwarded to covering Provider for signature.

## 2015-05-24 NOTE — Telephone Encounter (Signed)
Pt called office on 05/24/15 for refill.

## 2015-08-01 ENCOUNTER — Other Ambulatory Visit: Payer: Self-pay | Admitting: Family Medicine

## 2015-08-02 ENCOUNTER — Telehealth: Payer: Self-pay | Admitting: *Deleted

## 2015-08-02 NOTE — Telephone Encounter (Signed)
Received fax from [facility] requesting refill of Xanax. Last Rx 05/24/15, #60. Last UDS 07/2014 and pt was due for repeat in 01/2015. Pt also past due for 6 month f/u and has no future appts scheduled. Please advise request?

## 2015-08-02 NOTE — Telephone Encounter (Signed)
Refill #30---- needs ov

## 2015-08-03 ENCOUNTER — Encounter: Payer: Self-pay | Admitting: Family Medicine

## 2015-08-03 ENCOUNTER — Encounter: Payer: Self-pay | Admitting: Medical

## 2015-08-03 ENCOUNTER — Telehealth: Payer: Self-pay | Admitting: Family Medicine

## 2015-08-03 ENCOUNTER — Ambulatory Visit: Payer: BC Managed Care – PPO | Admitting: Internal Medicine

## 2015-08-03 ENCOUNTER — Ambulatory Visit (INDEPENDENT_AMBULATORY_CARE_PROVIDER_SITE_OTHER): Payer: BC Managed Care – PPO | Admitting: Medical

## 2015-08-03 VITALS — BP 118/78 | HR 87 | Temp 98.1°F | Ht 60.75 in | Wt 161.6 lb

## 2015-08-03 DIAGNOSIS — J309 Allergic rhinitis, unspecified: Secondary | ICD-10-CM | POA: Diagnosis not present

## 2015-08-03 MED ORDER — AZITHROMYCIN 250 MG PO TABS: ORAL_TABLET | ORAL | Status: DC

## 2015-08-03 MED ORDER — LORATADINE 10 MG PO TABS: 10.0000 mg | ORAL_TABLET | Freq: Every day | ORAL | Status: DC

## 2015-08-03 MED ORDER — BENZONATATE 100 MG PO CAPS: 100.0000 mg | ORAL_CAPSULE | Freq: Three times a day (TID) | ORAL | Status: DC | PRN

## 2015-08-03 MED ORDER — ALPRAZOLAM 0.5 MG PO TABS: 0.5000 mg | ORAL_TABLET | Freq: Three times a day (TID) | ORAL | Status: DC | PRN

## 2015-08-03 MED ORDER — FLUTICASONE PROPIONATE 50 MCG/ACT NA SUSP: 2.0000 | Freq: Every day | NASAL | Status: DC

## 2015-08-03 NOTE — Progress Notes (Signed)
Subjective:    Patient ID: Brianna Carroll, female    DOB: Nov 08, 1960, 55 y.o.   MRN: [id number]  HPI  Pt in with cough, ear pressure, runny nose, and some nasal fullness. Faint hoarse voice.(pt sick for 4 days)  For one day with cough she has faint transient burn . She points to suprasternal notch.  Pt had no sneezing. Husband was sick last week. He needed an antibiotic. Per pt he had sinus infection.  Pt does have flonase but not using. Hx of allergies this time of the year.     Review of Systems  Constitutional: Positive for fever and fatigue. Negative for chills and diaphoresis.       Subjective fever.  HENT: Positive for congestion.   Respiratory: Positive for cough. Negative for chest tightness, shortness of breath and wheezing.   Cardiovascular: Negative for chest pain and palpitations.  Gastrointestinal: Negative for abdominal pain.  Musculoskeletal: Negative for myalgias and back pain.  Hematological: Negative for adenopathy. Does not bruise/bleed easily.  Psychiatric/Behavioral: Negative for behavioral problems and confusion.   Past Medical History  Diagnosis Date  . Allergic rhinitis   . Hearing loss   . Snoring   . Mitral valve prolapse   . Hyperlipidemia   . Low back pain syndrome   . Anxiety   . Anemia, iron deficiency   . Sinus headache     Social History   Social History  . Marital Status: Married    Spouse Name: [name]  . Number of Children: 2  . Years of Education: N/A   Occupational History  . accounting    Social History Main Topics  . Smoking status: Never Smoker   . Smokeless tobacco: Never Used  . Alcohol Use: 0.6 oz/week    1 Standard drinks or equivalent per week  . Drug Use: No  . Sexual Activity:    Partners: Male    Birth Control/ Protection: Surgical     Comment: BTL   Other Topics Concern  . Not on file   Social History Narrative    Past Surgical History  Procedure Laterality Date  . Tubal ligation  05/30/94  .  Cesarean section  11/90, 12/95  . Lumbar disc surgery  2004    Family History  Problem Relation Age of Onset  . Cancer Father     Lung   . Cancer Mother     Lung Cancer  . Cancer Brother     throat cancer-heavy smoker  . COPD Sister     Allergies  Allergen Reactions  . Doxycycline Rash    REACTION: hives  . Codeine     REACTION: nausea and vomiting  . Hydrocodone-Acetaminophen     REACTION: vomiting  . Hydrocodone-Homatropine     REACTION: nausea and vomiting    Current Outpatient Prescriptions on File Prior to Visit  Medication Sig Dispense Refill  . ALPRAZolam (XANAX) 0.5 MG tablet Take 1 tablet (0.5 mg total) by mouth 3 (three) times daily as needed. for anxiety 30 tablet 0  . Fenofibrate Micronized (ANTARA) 90 MG CAPS 1 po qd 90 capsule 1  . fluticasone (FLONASE) 50 MCG/ACT nasal spray Place 2 sprays into both nostrils as needed. 16 g 11  . naproxen (NAPROSYN) 500 MG tablet Take 1 tablet (500 mg total) by mouth 2 (two) times daily with a meal. 60 tablet 0  . simvastatin (ZOCOR) 40 MG tablet Take 1 tablet (40 mg total) by mouth at bedtime. 30 tablet 0  .  traMADol (ULTRAM) 50 MG tablet TAKE ONE TABLET BY MOUTH THREE TIMES DAILY AS NEEDED FOR PAIN 90 tablet 5   No current facility-administered medications on file prior to visit.    BP 118/78 mmHg  Pulse 87  Temp(Src) 98.1 F (36.7 C) (Oral)  Ht 5' 0.75" (1.543 m)  Wt 161 lb 9.6 oz (73.301 kg)  BMI 30.79 kg/m2  SpO2 98%       Objective:   Physical Exam   General  Mental Status - Alert. General Appearance - Well groomed. Not in acute distress.  Skin Rashes- No Rashes.  HEENT Head- Normal. Ear Auditory Canal - Left- Normal. Right - Normal.Tympanic Membrane- Left- Normal. Right- Normal. Eye Sclera/Conjunctiva- Left- Normal. Right- Normal. Nose & Sinuses Nasal Mucosa- Left-  Boggy and Congested. Right-  Boggy and  Congested.Bilateral no maxillary and no frontal sinus pressure. Mouth & Throat Lips:  Upper Lip- Normal: no dryness, cracking, pallor, cyanosis, or vesicular eruption. Lower Lip-Normal: no dryness, cracking, pallor, cyanosis or vesicular eruption. Buccal Mucosa- Bilateral- No Aphthous ulcers. Oropharynx- No Discharge or Erythema. +pnd. Tonsils: Characteristics- Bilateral- No Erythema or Congestion. Size/Enlargement- Bilateral- No enlargement. Discharge- bilateral-None.  Neck Neck- Supple. No Masses.   Chest and Lung Exam Auscultation: Breath Sounds:-Clear even and unlabored.  Cardiovascular Auscultation:Rythm- Regular, rate and rhythm. Murmurs & Other Heart Sounds:Ausculatation of the heart reveal- No Murmurs.  Lymphatic Head & Neck General Head & Neck Lymphatics: Bilateral: Description- No Localized lymphadenopathy.      Assessment & Plan:  You appear to have allergic rhinitis vs vasomotor rhinitis.  Will rx flonase for congestion, benzonatate for cough, and claritin.  If your symptoms worsen indicating infection then can start azithromycin. But not indicated presently.  Follow up in 7 days or as needed

## 2015-08-03 NOTE — Patient Instructions (Signed)
You appear to have allergic rhinitis vs vasomotor rhinitis.  Will rx flonase for congestion, benzonatate for cough, and claritin.  If your symptoms worsen indicating infection then can start azithromycin. But not indicated presently.  Follow up in 7 days or as needed

## 2015-08-03 NOTE — Telephone Encounter (Signed)
Rx faxed.    KP 

## 2015-08-03 NOTE — Telephone Encounter (Signed)
Error

## 2015-08-03 NOTE — Progress Notes (Signed)
Pre visit review using our clinic review tool, if applicable. No additional management support is needed unless otherwise documented below in the visit note. 

## 2015-08-20 ENCOUNTER — Ambulatory Visit (INDEPENDENT_AMBULATORY_CARE_PROVIDER_SITE_OTHER): Payer: BC Managed Care – PPO | Admitting: Family Medicine

## 2015-08-20 ENCOUNTER — Ambulatory Visit (INDEPENDENT_AMBULATORY_CARE_PROVIDER_SITE_OTHER): Payer: BC Managed Care – PPO

## 2015-08-20 VITALS — BP 138/70 | HR 71 | Temp 98.5°F | Resp 12 | Ht 61.0 in | Wt 163.0 lb

## 2015-08-20 DIAGNOSIS — J101 Influenza due to other identified influenza virus with other respiratory manifestations: Secondary | ICD-10-CM

## 2015-08-20 DIAGNOSIS — R6889 Other general symptoms and signs: Secondary | ICD-10-CM | POA: Diagnosis not present

## 2015-08-20 DIAGNOSIS — R0602 Shortness of breath: Secondary | ICD-10-CM

## 2015-08-20 LAB — POCT CBC
GRANULOCYTE PERCENT: 64.9 % (ref 37–80)
HCT, POC: 36.8 % — AB (ref 37.7–47.9)
Hemoglobin: 13 g/dL (ref 12.2–16.2)
Lymph, poc: 1.1 (ref 0.6–3.4)
MCH, POC: 31.7 pg — AB (ref 27–31.2)
MCHC: 35.3 g/dL (ref 31.8–35.4)
MCV: 89.7 fL (ref 80–97)
MID (CBC): 0.3 (ref 0–0.9)
MPV: 6.9 fL (ref 0–99.8)
PLATELET COUNT, POC: 231 10*3/uL (ref 142–424)
POC Granulocyte: 2.7 (ref 2–6.9)
POC LYMPH %: 27.4 % (ref 10–50)
POC MID %: 7.7 % (ref 0–12)
RBC: 4.1 M/uL (ref 4.04–5.48)
RDW, POC: 13.2 %
WBC: 4.1 10*3/uL — AB (ref 4.6–10.2)

## 2015-08-20 LAB — POCT INFLUENZA A/B
Influenza A, POC: NEGATIVE
Influenza B, POC: POSITIVE — AB

## 2015-08-20 NOTE — Patient Instructions (Addendum)
Because you received an x-ray today, you will receive an invoice from [facility]. Please contact [facility] at [phone] with questions or concerns regarding your invoice. Our billing staff will not be able to assist you with those questions.  1. IBUPROFEN 200MG  --- 4 TABLETS THREE TIMES DAILY. 2.  TYLENOL 500MG  --- 2 TABLETS THREE TIMES DAILY.   3. CONTINUE AM AND PM DECONGESTANT; CONTINUE COUGH SYRUP.   Influenza, Adult Influenza ("the flu") is a viral infection of the respiratory tract. It occurs more often in winter months because people spend more time in close contact with one another. Influenza can make you feel very sick. Influenza easily spreads from person to person (contagious). CAUSES  Influenza is caused by a virus that infects the respiratory tract. You can catch the virus by breathing in droplets from an infected person's cough or sneeze. You can also catch the virus by touching something that was recently contaminated with the virus and then touching your mouth, nose, or eyes. RISKS AND COMPLICATIONS You may be at risk for a more severe case of influenza if you smoke cigarettes, have diabetes, have chronic heart disease (such as heart failure) or lung disease (such as asthma), or if you have a weakened immune system. Elderly people and pregnant women are also at risk for more serious infections. The most common problem of influenza is a lung infection (pneumonia). Sometimes, this problem can require emergency medical care and may be life threatening. SIGNS AND SYMPTOMS  Symptoms typically last 4 to 10 days and may include:  Fever.  Chills.  Headache, body aches, and muscle aches.  Sore throat.  Chest discomfort and cough.  Poor appetite.  Weakness or feeling tired.  Dizziness.  Nausea or vomiting. DIAGNOSIS  Diagnosis of influenza is often made based on your history and a physical exam. A nose or throat swab test can be done to confirm the  diagnosis. TREATMENT  In mild cases, influenza goes away on its own. Treatment is directed at relieving symptoms. For more severe cases, your health care provider may prescribe antiviral medicines to shorten the sickness. Antibiotic medicines are not effective because the infection is caused by a virus, not by bacteria. HOME CARE INSTRUCTIONS  Take medicines only as directed by your health care provider.  Use a cool mist humidifier to make breathing easier.  Get plenty of rest until your temperature returns to normal. This usually takes 3 to 4 days.  Drink enough fluid to keep your urine clear or pale yellow.  Cover yourmouth and nosewhen coughing or sneezing,and wash your handswellto prevent thevirusfrom spreading.  Stay homefromwork orschool untilthe fever is gonefor at least 36full day. PREVENTION  An annual influenza vaccination (flu shot) is the best way to avoid getting influenza. An annual flu shot is now routinely recommended for all adults in the [place] IF:  You experiencechest pain, yourcough worsens,or you producemore mucus.  Youhave nausea,vomiting, ordiarrhea.  Your fever returns or gets worse. SEEK IMMEDIATE MEDICAL CARE IF:  You havetrouble breathing, you become short of breath,or your skin ornails becomebluish.  You have severe painor stiffnessin the neck.  You develop a sudden headache, or pain in the face or ear.  You have nausea or vomiting that you cannot control. MAKE SURE YOU:   Understand these instructions.  Will watch your condition.  Will get help right away if you are not doing well or get worse.   This information is not intended to replace advice given  to you by your health care provider. Make sure you discuss any questions you have with your health care provider.   Document Released: 05/31/2000 Document Revised: 06/24/2014 Document Reviewed: 09/02/2011 Elsevier Interactive Patient Education [employer]

## 2015-08-20 NOTE — Progress Notes (Signed)
Subjective:    Patient ID: Brianna Carroll, female    DOB: 11-Jul-1960, 55 y.o.   MRN: [id number]  08/20/2015  Cough   HPI [name] presents for evaluation of congestion.  Evaluated at PCP office three weeks ago.  Started abx for five days; Zithromax; achiness resolved but rhinorrhea persisted. Then last week, coworker came in and was diagnosed with pneumonia; two days later (which was five days ago) pt started coughing; has progressively worsened.  Feels terrible. Burning in chest and throat.  PND and congestion in ears.  Excessively rhinorrhea; sneezing a lot; coughing up yellow sputum.  Small amount of sputum.  Not feeling any better.  Worried about missing work all week.  Having a hard time breathing.  Has taken non-drowsy cough medication [employer] brand; took decongestant this morning; took three Ibuprofen; drinking water. Took temperature yesterday 98.5.  +chills/sweats onset yesterday.  +HA.  R ear pain intermittently especially with laying down.  No ST; dry throat.  +rhinorrhea clear.  +coughing excessively.  +SOB.  Painful coughing B ribs.  No wheezing. Having a hard time breathing.  Feels horrible; +body aches.  S/p flu vaccine.  No tobacco abuse.  Stopped Claritin two days ago when started decongestant.    Review of Systems  Constitutional: Positive for fever, chills, diaphoresis and fatigue.  HENT: Positive for congestion, ear pain, postnasal drip, rhinorrhea, sinus pressure, sneezing, sore throat and voice change. Negative for trouble swallowing.   Respiratory: Positive for cough and shortness of breath. Negative for wheezing.   Gastrointestinal: Negative for nausea, vomiting and diarrhea.  Neurological: Positive for headaches. Negative for dizziness.    Past Medical History  Diagnosis Date  . Allergic rhinitis   . Hearing loss   . Snoring   . Mitral valve prolapse   . Hyperlipidemia   . Low back pain syndrome   . Anxiety   . Anemia, iron deficiency   . Sinus  headache    Past Surgical History  Procedure Laterality Date  . Tubal ligation  05/30/94  . Cesarean section  11/90, 12/95  . Lumbar disc surgery  2004   Allergies  Allergen Reactions  . Doxycycline Rash    REACTION: hives  . Codeine     REACTION: nausea and vomiting  . Hydrocodone-Acetaminophen     REACTION: vomiting  . Hydrocodone-Homatropine     REACTION: nausea and vomiting    Social History   Social History  . Marital Status: Married    Spouse Name: Brianna Carroll  . Number of Children: 2  . Years of Education: N/A   Occupational History  . accounting    Social History Main Topics  . Smoking status: Never Smoker   . Smokeless tobacco: Never Used  . Alcohol Use: 0.6 oz/week    1 Standard drinks or equivalent per week  . Drug Use: No  . Sexual Activity:    Partners: Male    Birth Control/ Protection: Surgical     Comment: BTL   Other Topics Concern  . Not on file   Social History Narrative   Family History  Problem Relation Age of Onset  . Cancer Father     Lung   . Cancer Mother     Lung Cancer  . Cancer Brother     throat cancer-heavy smoker  . COPD Sister        Objective:    BP 138/70 mmHg  Pulse 71  Temp(Src) 98.5 F (36.9 C) (Oral)  Resp  12  Ht 5\' 1"  (1.549 m)  Wt 163 lb (73.936 kg)  BMI 30.81 kg/m2  SpO2 99% Physical Exam  Constitutional: [name], place, and time. She appears well-developed and well-nourished. No distress.  HENT:  Head: Normocephalic and atraumatic.  Right Ear: Tympanic membrane, external ear and ear canal normal.  Left Ear: Tympanic membrane, external ear and ear canal normal.  Nose: Mucosal edema and rhinorrhea present. Right sinus exhibits maxillary sinus tenderness. Right sinus exhibits no frontal sinus tenderness. Left sinus exhibits maxillary sinus tenderness. Left sinus exhibits no frontal sinus tenderness.  Mouth/Throat: Uvula is midline, oropharynx is clear and moist and mucous membranes are  normal. No oropharyngeal exudate.  Eyes: Conjunctivae are normal. Pupils are equal, round, and reactive to light.  Neck: Normal range of motion. Neck supple. No thyromegaly present.  Cardiovascular: Normal rate, regular rhythm and normal heart sounds.  Exam reveals no gallop and no friction rub.   No murmur heard. Pulmonary/Chest: Effort normal and breath sounds normal. She has no wheezes. She has no rales.  Lymphadenopathy:    She has no cervical adenopathy.  Neurological: She is alert and oriented to person, place, and time.  Skin: She is not diaphoretic.  Psychiatric: She has a normal mood and affect. Her behavior is normal.  Nursing note and vitals reviewed.  Results for orders placed or performed in visit on 08/20/15  POCT Influenza A/B  Result Value Ref Range   Influenza A, POC Negative Negative   Influenza B, POC Positive (A) Negative  POCT CBC  Result Value Ref Range   WBC 4.1 (A) 4.6 - 10.2 K/uL   Lymph, poc 1.1 0.6 - 3.4   POC LYMPH PERCENT 27.4 10 - 50 %L   MID (cbc) 0.3 0 - 0.9   POC MID % 7.7 0 - 12 %M   POC Granulocyte 2.7 2 - 6.9   Granulocyte percent 64.9 37 - 80 %G   RBC 4.10 4.04 - 5.48 M/uL   Hemoglobin 13.0 12.2 - 16.2 g/dL   HCT, POC 36.8 (A) 37.7 - 47.9 %   MCV 89.7 80 - 97 fL   MCH, POC 31.7 (A) 27 - 31.2 pg   MCHC 35.3 31.8 - 35.4 g/dL   RDW, POC 13.2 %   Platelet Count, POC 231 142 - 424 K/uL   MPV 6.9 0 - 99.8 fL   No results found.     Assessment & Plan:   1. Influenza B   2. Flu-like symptoms   3. Shortness of breath     Orders Placed This Encounter  Procedures  . DG Chest 2 View    Standing Status: Future     Number of Occurrences: 1     Standing Expiration Date: 08/19/2016    Order Specific Question:  Reason for Exam (SYMPTOM  OR DIAGNOSIS REQUIRED)    Answer:  cough, SOB, chills, body aches    Order Specific Question:  Is the patient pregnant?    Answer:  No    Order Specific Question:  Preferred imaging location?    Answer:   External  . POCT Influenza A/B  . POCT CBC   No orders of the defined types were placed in this encounter.    No Follow-up on file.    Brianna Carroll, M.D. [facility] [address] [place], [place]  [address] [phone] phone [phone] fax

## 2015-08-28 ENCOUNTER — Other Ambulatory Visit: Payer: Self-pay | Admitting: Family Medicine

## 2015-08-28 NOTE — Telephone Encounter (Signed)
Please schedule this patient for a follow up/Fasting.    KP

## 2015-08-29 NOTE — Telephone Encounter (Signed)
Left message on VM for patient to call and schedule a follow up with Dr. Etter Sjogren

## 2015-09-08 ENCOUNTER — Telehealth: Payer: Self-pay | Admitting: *Deleted

## 2015-09-08 MED ORDER — ALPRAZOLAM 0.5 MG PO TABS: 0.5000 mg | ORAL_TABLET | Freq: Three times a day (TID) | ORAL | Status: DC | PRN

## 2015-09-08 NOTE — Telephone Encounter (Signed)
Received request from [facility] for xanax #[phone]. Pt is past due for f/u with PCP. Rx printed for #30 tablets by covering Provider, Inda Castle, NP and faxed to pharmacy.

## 2015-09-18 ENCOUNTER — Ambulatory Visit (INDEPENDENT_AMBULATORY_CARE_PROVIDER_SITE_OTHER): Payer: BC Managed Care – PPO | Admitting: Family Medicine

## 2015-09-18 ENCOUNTER — Encounter: Payer: Self-pay | Admitting: Family Medicine

## 2015-09-18 VITALS — BP 122/72 | HR 55 | Temp 98.1°F | Wt 158.0 lb

## 2015-09-18 DIAGNOSIS — M7989 Other specified soft tissue disorders: Secondary | ICD-10-CM

## 2015-09-18 DIAGNOSIS — E785 Hyperlipidemia, unspecified: Secondary | ICD-10-CM

## 2015-09-18 MED ORDER — HYDROCHLOROTHIAZIDE 25 MG PO TABS: 25.0000 mg | ORAL_TABLET | Freq: Every day | ORAL | Status: DC

## 2015-09-18 NOTE — Progress Notes (Signed)
Pre visit review using our clinic review tool, if applicable. No additional management support is needed unless otherwise documented below in the visit note. 

## 2015-09-18 NOTE — Patient Instructions (Signed)
Edema °Edema is an abnormal buildup of fluids in your body tissues. Edema is somewhat dependent on gravity to pull the fluid to the lowest place in your body. That makes the condition more common in the legs and thighs (lower extremities). Painless swelling of the feet and ankles is common and becomes more likely as you get older. It is also common in looser tissues, like around your eyes.  °When the affected area is squeezed, the fluid may move out of that spot and leave a dent for a few moments. This dent is called pitting.  °CAUSES  °There are many possible causes of edema. Eating too much salt and being on your feet or sitting for a long time can cause edema in your legs and ankles. Hot weather may make edema worse. Common medical causes of edema include: °· Heart failure. °· Liver disease. °· Kidney disease. °· Weak blood vessels in your legs. °· Cancer. °· An injury. °· Pregnancy. °· Some medications. °· Obesity.  °SYMPTOMS  °Edema is usually painless. Your skin may look swollen or shiny.  °DIAGNOSIS  °Your health care provider may be able to diagnose edema by asking about your medical history and doing a physical exam. You may need to have tests such as X-rays, an electrocardiogram, or blood tests to check for medical conditions that may cause edema.  °TREATMENT  °Edema treatment depends on the cause. If you have heart, liver, or kidney disease, you need the treatment appropriate for these conditions. General treatment may include: °· Elevation of the affected body part above the level of your heart. °· Compression of the affected body part. Pressure from elastic bandages or support stockings squeezes the tissues and forces fluid back into the blood vessels. This keeps fluid from entering the tissues. °· Restriction of fluid and salt intake. °· Use of a water pill (diuretic). These medications are appropriate only for some types of edema. They pull fluid out of your body and make you urinate more often. This  gets rid of fluid and reduces swelling, but diuretics can have side effects. Only use diuretics as directed by your health care provider. °HOME CARE INSTRUCTIONS  °· Keep the affected body part above the level of your heart when you are lying down.   °· Do not sit still or stand for prolonged periods.   °· Do not put anything directly under your knees when lying down. °· Do not wear constricting clothing or garters on your upper legs.   °· Exercise your legs to work the fluid back into your blood vessels. This may help the swelling go down.   °· Wear elastic bandages or support stockings to reduce ankle swelling as directed by your health care provider.   °· Eat a low-salt diet to reduce fluid if your health care provider recommends it.   °· Only take medicines as directed by your health care provider.  °SEEK MEDICAL CARE IF:  °· Your edema is not responding to treatment. °· You have heart, liver, or kidney disease and notice symptoms of edema. °· You have edema in your legs that does not improve after elevating them.   °· You have sudden and unexplained weight gain. °SEEK IMMEDIATE MEDICAL CARE IF:  °· You develop shortness of breath or chest pain.   °· You cannot breathe when you lie down. °· You develop pain, redness, or warmth in the swollen areas.   °· You have heart, liver, or kidney disease and suddenly get edema. °· You have a fever and your symptoms suddenly get worse. °MAKE SURE YOU:  °·   Understand these instructions. °· Will watch your condition. °· Will get help right away if you are not doing well or get worse. °  °This information is not intended to replace advice given to you by your health care provider. Make sure you discuss any questions you have with your health care provider. °  °Document Released: 06/03/2005 Document Revised: 06/24/2014 Document Reviewed: 03/26/2013 °Elsevier Interactive Patient Education ©2016 [employer] ° °

## 2015-09-18 NOTE — Progress Notes (Signed)
Patient ID: Brianna Carroll, female    DOB: February 24, 1961  Age: 55 y.o. MRN: [id number]    Subjective:  Subjective HPI TAKAKO ASP presents for cholesterol check  And she is c/o hands swelling   Review of Systems  Constitutional: Negative for diaphoresis, appetite change, fatigue and unexpected weight change.  Eyes: Negative for pain, redness and visual disturbance.  Respiratory: Negative for cough, chest tightness, shortness of breath and wheezing.   Cardiovascular: Negative for chest pain, palpitations and leg swelling.  Endocrine: Negative for cold intolerance, heat intolerance, polydipsia, polyphagia and polyuria.  Genitourinary: Negative for dysuria, frequency and difficulty urinating.  Neurological: Negative for dizziness, light-headedness, numbness and headaches.    History Past Medical History  Diagnosis Date  . Allergic rhinitis   . Hearing loss   . Snoring   . Mitral valve prolapse   . Hyperlipidemia   . Low back pain syndrome   . Anxiety   . Anemia, iron deficiency   . Sinus headache     She has past surgical history that includes Tubal ligation (05/30/94); Cesarean section (11/90, 12/95); and Lumbar disc surgery (2004).   Her family history includes COPD in her sister; Cancer in her brother, father, and mother.She reports that she has never smoked. She has never used smokeless tobacco. She reports that she drinks about 0.6 oz of alcohol per week. She reports that she does not use illicit drugs.  Current Outpatient Prescriptions on File Prior to Visit  Medication Sig Dispense Refill  . ALPRAZolam (XANAX) 0.5 MG tablet Take 1 tablet (0.5 mg total) by mouth 3 (three) times daily as needed. for anxiety 30 tablet 0  . ANTARA 90 MG CAPS TAKE ONE CAPSULE BY MOUTH ONCE DAILY 30 capsule 0  . fluticasone (FLONASE) 50 MCG/ACT nasal spray Place 2 sprays into both nostrils daily. 16 g 11  . loratadine (CLARITIN) 10 MG tablet Take 1 tablet (10 mg total) by mouth daily. 30  tablet 3  . naproxen (NAPROSYN) 500 MG tablet Take 1 tablet (500 mg total) by mouth 2 (two) times daily with a meal. 60 tablet 0  . simvastatin (ZOCOR) 40 MG tablet TAKE ONE TABLET BY MOUTH AT BEDTIME 30 tablet 0  . traMADol (ULTRAM) 50 MG tablet TAKE ONE TABLET BY MOUTH THREE TIMES DAILY AS NEEDED FOR PAIN 90 tablet 5   No current facility-administered medications on file prior to visit.     Objective:  Objective Physical Exam  Constitutional: [name], place, and time. She appears well-developed and well-nourished.  HENT:  Head: Normocephalic and atraumatic.  Eyes: Conjunctivae and EOM are normal.  Neck: Normal range of motion. Neck supple. No JVD present. Carotid bruit is not present. No thyromegaly present.  Cardiovascular: Normal rate, regular rhythm and normal heart sounds.   No murmur heard. Pulmonary/Chest: Effort normal and breath sounds normal. No respiratory distress. She has no wheezes. She has no rales. She exhibits no tenderness.  Musculoskeletal: She exhibits no edema.  + swelling hands   Neurological: She is alert and oriented to person, place, and time.  Psychiatric: She has a normal mood and affect. Her behavior is normal.  Nursing note and vitals reviewed.  BP 122/72 mmHg  Pulse 55  Temp(Src) 98.1 F (36.7 C) (Oral)  Wt 158 lb (71.668 kg)  SpO2 98% Wt Readings from Last 3 Encounters:  09/18/15 158 lb (71.668 kg)  08/20/15 163 lb (73.936 kg)  08/03/15 161 lb 9.6 oz (73.301 kg)  Lab Results  Component Value Date   WBC 4.1* 08/20/2015   HGB 13.0 08/20/2015   HCT 36.8* 08/20/2015   PLT 317.0 10/23/2011   GLUCOSE 92 08/02/2014   CHOL 145 08/02/2014   TRIG 128.0 08/02/2014   HDL 46.70 08/02/2014   LDLDIRECT 94.4 10/23/2011   LDLCALC 73 08/02/2014   ALT 17 08/02/2014   AST 18 08/02/2014   NA 140 08/02/2014   K 4.5 08/02/2014   CL 106 08/02/2014   CREATININE 0.94 08/02/2014   BUN 14 08/02/2014   CO2 30 08/02/2014   TSH 2.900  11/25/2013    Mm Digital Screening Bilateral  02/14/2015  CLINICAL DATA:  Screening. EXAM: DIGITAL SCREENING BILATERAL MAMMOGRAM WITH CAD COMPARISON:  Previous exam(s). ACR Breast Density Category b: There are scattered areas of fibroglandular density. FINDINGS: There are no findings suspicious for malignancy. Images were processed with CAD. IMPRESSION: No mammographic evidence of malignancy. A result letter of this screening mammogram will be mailed directly to the patient. RECOMMENDATION: Screening mammogram in one year. (Code:SM-B-01Y) BI-RADS CATEGORY  1: Negative. Electronically Signed   By: Altamese Cabal M.D.   On: 02/14/2015 14:49     Assessment & Plan:  Plan I have discontinued Ms. Gutierres's benzonatate. I am also having her start on hydrochlorothiazide. Additionally, I am having her maintain her traMADol, naproxen, fluticasone, loratadine, simvastatin, ANTARA, and ALPRAZolam.  Meds ordered this encounter  Medications  . hydrochlorothiazide (HYDRODIURIL) 25 MG tablet    Sig: Take 1 tablet (25 mg total) by mouth daily.    Dispense:  30 tablet    Refill:  11    Problem List Items Addressed This Visit    None    Visit Diagnoses    Hyperlipidemia    -  Primary    Relevant Medications    hydrochlorothiazide (HYDRODIURIL) 25 MG tablet    Other Relevant Orders    Lipid panel    Comprehensive metabolic panel    Swelling of both hands        Relevant Medications    hydrochlorothiazide (HYDRODIURIL) 25 MG tablet    Other Relevant Orders    Thyroid Panel With TSH       Follow-up: Return in about 6 months (around 03/19/2016) for hypertension, hyperlipidemia.  Ann Held, DO

## 2015-09-19 LAB — COMPREHENSIVE METABOLIC PANEL
ALBUMIN: 4.5 g/dL (ref 3.5–5.2)
ALT: 17 U/L (ref 0–35)
AST: 22 U/L (ref 0–37)
Alkaline Phosphatase: 43 U/L (ref 39–117)
BUN: 13 mg/dL (ref 6–23)
CALCIUM: 9.8 mg/dL (ref 8.4–10.5)
CHLORIDE: 104 meq/L (ref 96–112)
CO2: 30 mEq/L (ref 19–32)
CREATININE: 0.86 mg/dL (ref 0.40–1.20)
GFR: 72.73 mL/min (ref >60.00)
Glucose, Bld: 92 mg/dL (ref 70–99)
Potassium: 4.3 mEq/L (ref 3.5–5.1)
SODIUM: 140 meq/L (ref 135–145)
Total Bilirubin: 0.4 mg/dL (ref 0.2–1.2)
Total Protein: 7 g/dL (ref 6.0–8.3)

## 2015-09-19 LAB — THYROID PANEL WITH TSH
FREE THYROXINE INDEX: 2.5 (ref 1.4–3.8)
T3 Uptake: 33 % (ref 22–35)
T4 TOTAL: 7.5 ug/dL (ref 4.5–12.0)
TSH: 1.73 m[IU]/L

## 2015-09-19 LAB — LIPID PANEL
CHOL/HDL RATIO: 3
Cholesterol: 133 mg/dL (ref 0–200)
HDL: 47.3 mg/dL (ref >39.00)
LDL CALC: 69 mg/dL (ref 0–99)
NONHDL: 85.73
TRIGLYCERIDES: 83 mg/dL (ref 0.0–149.0)
VLDL: 16.6 mg/dL (ref 0.0–40.0)

## 2015-09-24 ENCOUNTER — Encounter: Payer: Self-pay | Admitting: Family Medicine

## 2015-09-25 ENCOUNTER — Other Ambulatory Visit: Payer: Self-pay | Admitting: Family Medicine

## 2015-09-25 NOTE — Telephone Encounter (Signed)
It was sent on 4/3

## 2015-09-25 NOTE — Telephone Encounter (Signed)
Called pharmacy and spoke with [facility].  She states that Rx is at the pharmacy.  Pt notified and made aware.  No further questions or concerns voiced.

## 2015-09-26 ENCOUNTER — Other Ambulatory Visit: Payer: Self-pay | Admitting: Family Medicine

## 2015-09-26 ENCOUNTER — Other Ambulatory Visit: Payer: Self-pay | Admitting: Family

## 2015-09-26 NOTE — Telephone Encounter (Signed)
Will defer further refills of patient's medications to PCP  

## 2015-09-26 NOTE — Telephone Encounter (Signed)
Medication filled to pharmacy as requested.   

## 2015-09-28 NOTE — Telephone Encounter (Signed)
Last seen 09/18/15 and filled 09/08/15 #30  Please advise    KP

## 2015-10-02 ENCOUNTER — Encounter: Payer: Self-pay | Admitting: Family Medicine

## 2015-10-02 DIAGNOSIS — M7989 Other specified soft tissue disorders: Secondary | ICD-10-CM

## 2015-10-02 MED ORDER — SIMVASTATIN 40 MG PO TABS: 40.0000 mg | ORAL_TABLET | Freq: Every day | ORAL | Status: DC

## 2015-10-02 MED ORDER — FENOFIBRATE MICRONIZED 90 MG PO CAPS: 1.0000 | ORAL_CAPSULE | Freq: Every day | ORAL | Status: DC

## 2015-10-02 MED ORDER — HYDROCHLOROTHIAZIDE 25 MG PO TABS: 25.0000 mg | ORAL_TABLET | Freq: Every day | ORAL | Status: DC

## 2015-10-16 ENCOUNTER — Other Ambulatory Visit: Payer: Self-pay | Admitting: Family

## 2015-10-16 ENCOUNTER — Other Ambulatory Visit: Payer: Self-pay | Admitting: Family Medicine

## 2015-10-16 NOTE — Telephone Encounter (Signed)
Last seen 09/18/15 and filled 08/02/15 #60   Please advise    KP

## 2015-10-16 NOTE — Telephone Encounter (Signed)
Last alprazolam Rx 09/28/15, #30.  Last OV:  09/18/15 UDS:  None on file.  Please advise request?

## 2015-10-30 ENCOUNTER — Encounter: Payer: Self-pay | Admitting: Family Medicine

## 2015-11-01 NOTE — Telephone Encounter (Signed)
Called and [facility] @ 9:08am @ [phone]) asking the pt to RTC regarding MyChart message on med refills.//AB/CMA

## 2015-11-09 ENCOUNTER — Encounter: Payer: Self-pay | Admitting: Family Medicine

## 2015-11-28 ENCOUNTER — Other Ambulatory Visit: Payer: Self-pay | Admitting: Family Medicine

## 2015-11-28 MED ORDER — NAPROXEN 500 MG PO TABS: 500.0000 mg | ORAL_TABLET | Freq: Two times a day (BID) | ORAL | Status: DC

## 2015-11-28 NOTE — Telephone Encounter (Signed)
Last seen 09/18/15 and filled 10/27/15 #30. She is requesting #90 with refills.    Please advise    KP

## 2015-11-28 NOTE — Telephone Encounter (Signed)
Provider only approved 30. Faxed.     KP

## 2015-11-28 NOTE — Telephone Encounter (Signed)
Relationship to patient: self Can be reached: [phone] Pharmacy: [facility] [place], [place]  Reason for call: Pt called for refill on alprazolam. Requesting 90 day supply to be sent in with refill. Pt asked that she is called if it cannot be filled in 90 day supply so that she knows. Also requesting refills either way. Pt also needing naproxen sent in for 90 day supply with refill.

## 2016-01-25 ENCOUNTER — Other Ambulatory Visit: Payer: Self-pay | Admitting: Family Medicine

## 2016-01-25 NOTE — Telephone Encounter (Signed)
Last seen 09/18/15 and filled 11/28/15 #30   Please advise     KP

## 2016-01-26 ENCOUNTER — Encounter: Payer: Self-pay | Admitting: Family Medicine

## 2016-01-26 DIAGNOSIS — M7989 Other specified soft tissue disorders: Secondary | ICD-10-CM

## 2016-01-30 MED ORDER — FENOFIBRATE MICRONIZED 90 MG PO CAPS: 1.0000 | ORAL_CAPSULE | Freq: Every day | ORAL | 1 refills | Status: DC

## 2016-01-30 MED ORDER — SIMVASTATIN 40 MG PO TABS: 40.0000 mg | ORAL_TABLET | Freq: Every day | ORAL | 1 refills | Status: DC

## 2016-01-30 MED ORDER — HYDROCHLOROTHIAZIDE 25 MG PO TABS: 25.0000 mg | ORAL_TABLET | Freq: Every day | ORAL | 3 refills | Status: DC

## 2016-01-30 NOTE — Addendum Note (Signed)
Addended by: Ewing Schlein on: 01/30/2016 06:48 PM   Modules accepted: Orders

## 2016-02-20 ENCOUNTER — Other Ambulatory Visit: Payer: Self-pay | Admitting: Family Medicine

## 2016-02-20 DIAGNOSIS — Z1231 Encounter for screening mammogram for malignant neoplasm of breast: Secondary | ICD-10-CM

## 2016-02-26 ENCOUNTER — Other Ambulatory Visit: Payer: Self-pay | Admitting: Family Medicine

## 2016-02-26 NOTE — Telephone Encounter (Signed)
Last seen 09/18/15 Last filled #30-0 refills Please advise----PC

## 2016-02-26 NOTE — Telephone Encounter (Signed)
Last seen 09/18/15 and filled 01/25/16 #30   Please advise     KP

## 2016-03-04 ENCOUNTER — Ambulatory Visit: Payer: BC Managed Care – PPO

## 2016-03-11 ENCOUNTER — Ambulatory Visit
Admission: RE | Admit: 2016-03-11 | Discharge: 2016-03-11 | Disposition: A | Discharge status: Home / Self Care | Payer: BC Managed Care – PPO | Source: Ambulatory Visit | Attending: Family Medicine | Admitting: Family Medicine

## 2016-03-11 DIAGNOSIS — Z1231 Encounter for screening mammogram for malignant neoplasm of breast: Secondary | ICD-10-CM

## 2016-03-16 ENCOUNTER — Other Ambulatory Visit: Payer: Self-pay | Admitting: Family Medicine

## 2016-03-18 NOTE — Telephone Encounter (Signed)
Last seen 09/18/15 and filled 02/26/16 #30   Please advise     KP

## 2016-03-21 ENCOUNTER — Encounter: Payer: Self-pay | Admitting: Obstetrics & Gynecology

## 2016-03-21 ENCOUNTER — Ambulatory Visit (INDEPENDENT_AMBULATORY_CARE_PROVIDER_SITE_OTHER): Payer: BC Managed Care – PPO | Admitting: Obstetrics & Gynecology

## 2016-03-21 VITALS — BP 116/70 | HR 68 | Resp 18 | Ht 60.75 in | Wt 161.4 lb

## 2016-03-21 DIAGNOSIS — Z0000 Encounter for general adult medical examination without abnormal findings: Secondary | ICD-10-CM | POA: Diagnosis not present

## 2016-03-21 DIAGNOSIS — Z01419 Encounter for gynecological examination (general) (routine) without abnormal findings: Secondary | ICD-10-CM | POA: Diagnosis not present

## 2016-03-21 DIAGNOSIS — Z205 Contact with and (suspected) exposure to viral hepatitis: Secondary | ICD-10-CM

## 2016-03-21 LAB — POCT URINALYSIS DIPSTICK
BILIRUBIN UA: NEGATIVE
Blood, UA: NEGATIVE
GLUCOSE UA: NEGATIVE
Ketones, UA: NEGATIVE
Nitrite, UA: NEGATIVE
Protein, UA: NEGATIVE
UROBILINOGEN UA: NEGATIVE
pH, UA: 5

## 2016-03-21 NOTE — Progress Notes (Signed)
55 y.o. G2P2 MarriedCaucasianF here for annual exam.  Doing well.  Is working on weight loss and exercise.  No vaginal bleeding.     No LMP recorded. Patient is not currently having periods (Reason: Perimenopausal).         Sexually active: Yes.    The current method of family planning is bilateral tubal ligation.    Exercising: Yes.    walk Smoker:  no  Health Maintenance: Pap:  12/16/14 Pap negative, neg HR HPV 4/13 History of abnormal Pap:  no MMG:  03/11/16 BIRADS1 negative Colonoscopy:  8/13-repeat in 2 years (inadequate prep), aware will be due. BMD:   none TDaP:  2015 PCP Hep C:  Drawn today Pneumonia and shingles vaccine: Not done yet Screening Labs: PCP, Hb today: PCP, Urine today: 1+ WBC   reports that she has never smoked. She has never used smokeless tobacco. She reports that she drinks about 0.6 oz of alcohol per week . She reports that she does not use drugs.  Past Medical History:  Diagnosis Date  . Allergic rhinitis   . Anemia, iron deficiency   . Anxiety   . Hearing loss   . Hyperlipidemia   . Low back pain syndrome   . Mitral valve prolapse   . Sinus headache   . Snoring     Past Surgical History:  Procedure Laterality Date  . CESAREAN SECTION  11/90, 12/95  . [place] SURGERY  2004  . TUBAL LIGATION  05/30/94    Family History  Problem Relation Age of Onset  . Cancer Father     Lung   . Cancer Mother     Lung Cancer  . Cancer Brother     throat cancer-heavy smoker  . COPD Sister     ROS:  Pertinent items are noted in HPI.  Otherwise, a comprehensive ROS was negative.  Exam:   Vitals:   03/21/16 0922  BP: 116/70  Pulse: 68  Resp: 18    General appearance: alert, cooperative and appears stated age Head: Normocephalic, without obvious abnormality, atraumatic Neck: no adenopathy, supple, symmetrical, trachea midline and thyroid normal to inspection and palpation Lungs: clear to auscultation bilaterally Breasts: normal appearance, no  masses or tenderness Heart: regular rate and rhythm Abdomen: soft, non-tender; bowel sounds normal; no masses,  no organomegaly Extremities: extremities normal, atraumatic, no cyanosis or edema Skin: Skin color, texture, turgor normal. No rashes or lesions Lymph nodes: Cervical, supraclavicular, and axillary nodes normal. No abnormal inguinal nodes palpated Neurologic: Grossly normal   Pelvic: External genitalia:  no lesions              Urethra:  normal appearing urethra with no masses, tenderness or lesions              Bartholins and Skenes: normal                 Vagina: normal appearing vagina with normal color and discharge, no lesions              Cervix: no lesions              Pap taken: No. Bimanual Exam:  Uterus:  normal size, contour, position, consistency, mobility, non-tender              Adnexa: normal adnexa and no mass, fullness, tenderness               Rectovaginal: Confirms  Anus:  normal sphincter tone, no lesions  Chaperone was present for exam.  A:     Well Woman with normal exam PMP DDD Anxiety   Elevated triglycerides  P: Mammogram yearly. Neg pap 2016. Neg HR HPV 4/13.  No pap today. Labs with Dr. Etter Sjogren Hep C antibody today Aware colonoscopy is due.  States WILL schedule this year. return annually or prn

## 2016-03-21 NOTE — Patient Instructions (Addendum)
Dr. Meriel Pica [address], [place], [place] [address]  Phone:  [phone]   [place] pm

## 2016-03-22 ENCOUNTER — Encounter: Payer: Self-pay | Admitting: Obstetrics & Gynecology

## 2016-03-22 LAB — HEPATITIS C ANTIBODY: HCV Ab: NEGATIVE

## 2016-03-30 ENCOUNTER — Other Ambulatory Visit: Payer: Self-pay | Admitting: Family Medicine

## 2016-03-30 ENCOUNTER — Other Ambulatory Visit: Payer: Self-pay | Admitting: Medical

## 2016-04-24 ENCOUNTER — Other Ambulatory Visit: Payer: Self-pay | Admitting: Family Medicine

## 2016-04-24 NOTE — Telephone Encounter (Signed)
Last OV: 09/18/15 Last filled: 03/18/16, #30, 0 RF Sig: TAKE ONE TABLET BY MOUTH THREE TIMES DAILY AS NEEDED FOR ANXIETY UDS: 08/02/14, negative for Xanax, low risk

## 2016-04-24 NOTE — Telephone Encounter (Signed)
Rx phoned to pharmacy.  

## 2016-04-25 NOTE — Telephone Encounter (Signed)
This medication refill request was denied today. This was phoned in to the pharmacy yesterday.

## 2016-05-22 ENCOUNTER — Ambulatory Visit (INDEPENDENT_AMBULATORY_CARE_PROVIDER_SITE_OTHER): Payer: BC Managed Care – PPO | Admitting: Medical

## 2016-05-22 ENCOUNTER — Encounter: Payer: Self-pay | Admitting: Medical

## 2016-05-22 VITALS — BP 114/72 | HR 71 | Temp 98.1°F | Ht 60.75 in | Wt 168.0 lb

## 2016-05-22 DIAGNOSIS — R0683 Snoring: Secondary | ICD-10-CM

## 2016-05-22 DIAGNOSIS — R05 Cough: Secondary | ICD-10-CM

## 2016-05-22 DIAGNOSIS — J209 Acute bronchitis, unspecified: Secondary | ICD-10-CM

## 2016-05-22 DIAGNOSIS — R059 Cough, unspecified: Secondary | ICD-10-CM

## 2016-05-22 DIAGNOSIS — J0100 Acute maxillary sinusitis, unspecified: Secondary | ICD-10-CM

## 2016-05-22 MED ORDER — BENZONATATE 100 MG PO CAPS: 100.0000 mg | ORAL_CAPSULE | Freq: Three times a day (TID) | ORAL | 0 refills | Status: DC | PRN

## 2016-05-22 MED ORDER — FLUTICASONE PROPIONATE 50 MCG/ACT NA SUSP: 2.0000 | Freq: Every day | NASAL | 1 refills | Status: DC

## 2016-05-22 MED ORDER — AZITHROMYCIN 250 MG PO TABS: ORAL_TABLET | ORAL | 0 refills | Status: DC

## 2016-05-22 NOTE — Patient Instructions (Signed)
You appear to have bronchitis and sinusitis. Rest hydrate and tylenol for fever. I am prescribing cough medicine benzonatate, and azithromycin antibiotic. For your nasal congestion rx flonase.  You should gradually get better. If not then notify us and would recommend a chest xray.  Follow up in 7-10 days or as needed

## 2016-05-22 NOTE — Progress Notes (Signed)
Subjective:    Patient ID: Brianna Carroll, female    DOB: Nov 25, 1960, 55 y.o.   MRN: [id number]  HPI   Pt in sick Saturday after thanksgiving.   Pt had nasal congestion, runny nose and some sinus pressure initially. Pt states at first when blew nose no mucous but last 4 days feeling more chest congested with some productive cough. Pt states ear tender as well. Left side ear tender more than rt side.   Pt states occasional waves of subjective fever. No diffuse myalgias/bodyaches.   Review of Systems  Constitutional: Positive for fever. Negative for chills and fatigue.  HENT: Positive for congestion, postnasal drip, sinus pressure and sneezing. Negative for sore throat and trouble swallowing.   Respiratory: Negative for cough, choking, wheezing and stridor.        Pt states husband reports some mild snoring in past. Worse with recent illness.   Pt states years of snoring.  Cardiovascular: Negative for chest pain and palpitations.  Gastrointestinal: Negative for abdominal pain, blood in stool and diarrhea.  Musculoskeletal: Negative for back pain.  Skin: Negative for rash.  Neurological: Negative for dizziness and headaches.  Hematological: Negative for adenopathy. Does not bruise/bleed easily.  Psychiatric/Behavioral: Negative for behavioral problems and confusion.    Past Medical History:  Diagnosis Date  . Allergic rhinitis   . Anemia, iron deficiency   . Anxiety   . Hearing loss   . Hyperlipidemia   . Low back pain syndrome   . Mitral valve prolapse   . Sinus headache   . Snoring      Social History   Social History  . Marital status: Married    Spouse name: [name]  . Number of children: 2  . 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 Smoking status: Never Smoker  . Smokeless tobacco: Never Used  . Alcohol use 0.6 oz/week    1 Standard drinks or equivalent per week  . Drug use: No    . Sexual activity: Yes    Partners: Male    Birth control/ protection: Surgical     Comment: BTL   Other Topics Concern  . Not on file   Social History Narrative  . No narrative on file    Past Surgical History:  Procedure Laterality Date  . CESAREAN SECTION  11/90, 12/95  . [place] SURGERY  2004  . TUBAL LIGATION  05/30/94    Family History  Problem Relation Age of Onset  . Cancer Father     Lung   . Cancer Mother     Lung Cancer  . Cancer Brother     throat cancer-heavy smoker  . COPD Sister     Allergies  Allergen Reactions  . Doxycycline Rash    REACTION: hives  . Codeine     REACTION: nausea and vomiting  . Hydrocodone-Acetaminophen     REACTION: vomiting  . Hydrocodone-Homatropine     REACTION: nausea and vomiting    Current Outpatient Prescriptions on File Prior to Visit  Medication Sig Dispense Refill  . ALPRAZolam (XANAX) 0.5 MG tablet TAKE ONE TABLET BY MOUTH THREE TIMES DAILY AS NEEDED FOR ANXIETY 30 tablet 0  . Fenofibrate Micronized (ANTARA) 90 MG CAPS Take 1 capsule by mouth daily. 90 capsule 1  . fluticasone (FLONASE) 50 MCG/ACT nasal spray Place 2 sprays into both nostrils daily. 16 g 11  . hydrochlorothiazide (HYDRODIURIL)  25 MG tablet Take 1 tablet (25 mg total) by mouth daily. 90 tablet 3  . loratadine (CLARITIN) 10 MG tablet TAKE ONE TABLET BY MOUTH ONCE DAILY 30 tablet 3  . naproxen (NAPROSYN) 500 MG tablet Take 1 tablet (500 mg total) by mouth 2 (two) times daily with a meal. 60 tablet 1  . simvastatin (ZOCOR) 40 MG tablet Take 1 tablet (40 mg total) by mouth at bedtime. Repeat labs are due now 90 tablet 0  . traMADol (ULTRAM) 50 MG tablet TAKE ONE TABLET BY MOUTH THREE TIMES DAILY AS NEEDED FOR PAIN 90 tablet 5   No current facility-administered medications on file prior to visit.     BP 114/72 (BP Location: Right Arm, Patient Position: Sitting, Cuff Size: Normal)   Pulse 71   Temp 98.1 F (36.7 C) (Oral)   Ht 5' 0.75" (1.543 m)    Wt 168 lb (76.2 kg)   LMP 09/15/2013   SpO2 98%   BMI 32.01 kg/m       Objective:   Physical Exam  General  Mental Status - Alert. General Appearance - Well groomed. Not in acute distress.  Skin Rashes- No Rashes.  HEENT Head- Normal. Ear Auditory Canal - Left- Normal. Right - Normal.Tympanic Membrane- Left- Normal. Right- Normal. Eye Sclera/Conjunctiva- Left- Normal. Right- Normal. Nose & Sinuses Nasal Mucosa- Left-  Boggy and Congested. Right-  Boggy and  Congested.Bilateral faint  Maxillary but no  frontal sinus pressure. Mouth & Throat Lips: Upper Lip- Normal: no dryness, cracking, pallor, cyanosis, or vesicular eruption. Lower Lip-Normal: no dryness, cracking, pallor, cyanosis or vesicular eruption. Buccal Mucosa- Bilateral- No Aphthous ulcers. Oropharynx- No Discharge or Erythema. Tonsils: Characteristics- Bilateral- No Erythema or Congestion. Size/Enlargement- Bilateral- No enlargement. Discharge- bilateral-None.  Neck Neck- Supple. No Masses.   Chest and Lung Exam Auscultation: Breath Sounds:-Clear even and unlabored.  Cardiovascular Auscultation:Rythm- Regular, rate and rhythm. Murmurs & Other Heart Sounds:Ausculatation of the heart reveal- No Murmurs.  Lymphatic Head & Neck General Head & Neck Lymphatics: Bilateral: Description- No Localized lymphadenopathy.      Assessment & Plan:   You appear to have bronchitis and sinusitis. Rest hydrate and tylenol for fever. I am prescribing cough medicine benzonatate, and azithromycin antibiotic. For your nasal congestion rx flonase.  You should gradually get better. If not then notify us and would recommend a chest xray.  Follow up in 7-10 days or as needed

## 2016-05-26 ENCOUNTER — Other Ambulatory Visit: Payer: Self-pay | Admitting: Family Medicine

## 2016-05-29 ENCOUNTER — Telehealth: Payer: Self-pay

## 2016-05-29 NOTE — Telephone Encounter (Signed)
Faxed Rx. LB

## 2016-06-07 ENCOUNTER — Encounter: Payer: Self-pay | Admitting: Obstetrics & Gynecology

## 2016-06-17 ENCOUNTER — Other Ambulatory Visit: Payer: Self-pay | Admitting: Family Medicine

## 2016-06-18 NOTE — Telephone Encounter (Signed)
Received request for Alprazolam 0.5mg  tablets #30; Take 1 tab po TID PRN for anxiety).  Last Rf: 06/02/2016 Last Ov: 09/18/2015 (with Provider) and Acute Visit with another Provider on 05/22/2016. Missed last scheduled visit for 03/19/2016 UDS: None  Forwarded to Provider for review, approval or denial.

## 2016-06-18 NOTE — Telephone Encounter (Signed)
Faxed a hard copy of refill request Alprazolam 0.5mg  to the designated pharmacy ([place] [phone]; [phone]).

## 2016-07-01 ENCOUNTER — Other Ambulatory Visit: Payer: Self-pay | Admitting: Family Medicine

## 2016-07-24 ENCOUNTER — Telehealth: Payer: Self-pay | Admitting: Family Medicine

## 2016-07-24 DIAGNOSIS — E785 Hyperlipidemia, unspecified: Secondary | ICD-10-CM

## 2016-07-25 ENCOUNTER — Institutional Professional Consult (permissible substitution): Payer: BC Managed Care – PPO | Admitting: Pulmonary Disease

## 2016-07-25 NOTE — Telephone Encounter (Signed)
Last filled 4/317 Last ov 06/18/16 Next ov is not scheduled Needs contract and UDS

## 2016-07-26 NOTE — Telephone Encounter (Signed)
Will collect UDS at next refill.  Patient will be trying benadryl and cut back and eventually get off of xanax.

## 2016-08-28 MED ORDER — ALPRAZOLAM 0.5 MG PO TABS: 0.5000 mg | ORAL_TABLET | Freq: Three times a day (TID) | ORAL | 0 refills | Status: DC | PRN

## 2016-08-28 NOTE — Telephone Encounter (Signed)
Patient called to see if she can get her labs done at [facility] but no labs are entered in her chart. Plse adv.

## 2016-08-28 NOTE — Telephone Encounter (Signed)
Patient tried the benadryl and cut back on xanax but it did not work.  She will be by on next Tuesday to fill out contract and do UDS.

## 2016-08-28 NOTE — Addendum Note (Signed)
Addended by: Kem Boroughs D on: 08/28/2016 05:15 PM   Modules accepted: Orders

## 2016-08-28 NOTE — Telephone Encounter (Signed)
Please call patient @ [phone]

## 2016-09-02 ENCOUNTER — Encounter: Payer: Self-pay | Admitting: Family Medicine

## 2016-09-14 ENCOUNTER — Other Ambulatory Visit: Payer: Self-pay | Admitting: Family Medicine

## 2016-09-14 ENCOUNTER — Other Ambulatory Visit: Payer: Self-pay | Admitting: Medical

## 2016-09-16 NOTE — Telephone Encounter (Signed)
Faxed hardcopy for Alprazolam to [facility] in [place]

## 2016-09-16 NOTE — Telephone Encounter (Signed)
Last office visit on 09/18/15 No future appt. Last refill on 08/28/2016  #30 not refills Contract signed on 09/02/2016 no uds.

## 2016-11-02 ENCOUNTER — Other Ambulatory Visit: Payer: Self-pay | Admitting: Family Medicine

## 2016-11-07 ENCOUNTER — Encounter: Payer: Self-pay | Admitting: Family Medicine

## 2016-11-07 ENCOUNTER — Telehealth: Payer: Self-pay | Admitting: Family Medicine

## 2016-11-07 MED ORDER — ALPRAZOLAM 0.5 MG PO TABS: 0.5000 mg | ORAL_TABLET | Freq: Three times a day (TID) | ORAL | 0 refills | Status: DC | PRN

## 2016-11-07 NOTE — Telephone Encounter (Signed)
Printed pcp signed and will fax to [facility] [place]

## 2016-11-07 NOTE — Telephone Encounter (Signed)
Patient uses [facility] in [place] [phone] needs refill for ALPRAZolam Brianna Carroll) 0.5 MG tablet patient is completely out pharmacy informed her she needed a follow up. Patient is scheduled for 11/22/16 at 2:15 for medication follow up. She is asking 1 refill until her appointment

## 2016-11-07 NOTE — Telephone Encounter (Signed)
Refill x1 

## 2016-11-22 ENCOUNTER — Ambulatory Visit: Payer: BC Managed Care – PPO | Admitting: Family Medicine

## 2016-11-27 ENCOUNTER — Encounter: Payer: Self-pay | Admitting: Family Medicine

## 2016-12-12 ENCOUNTER — Other Ambulatory Visit: Payer: Self-pay | Admitting: Family Medicine

## 2016-12-27 ENCOUNTER — Ambulatory Visit (INDEPENDENT_AMBULATORY_CARE_PROVIDER_SITE_OTHER): Payer: BC Managed Care – PPO | Admitting: Family Medicine

## 2016-12-27 ENCOUNTER — Encounter: Payer: Self-pay | Admitting: Family Medicine

## 2016-12-27 VITALS — BP 130/86 | HR 64 | Temp 98.1°F | Resp 16 | Ht 61.0 in | Wt 171.2 lb

## 2016-12-27 DIAGNOSIS — E785 Hyperlipidemia, unspecified: Secondary | ICD-10-CM | POA: Diagnosis not present

## 2016-12-27 DIAGNOSIS — F411 Generalized anxiety disorder: Secondary | ICD-10-CM

## 2016-12-27 MED ORDER — VENLAFAXINE HCL ER 37.5 MG PO CP24: 37.5000 mg | ORAL_CAPSULE | Freq: Every day | ORAL | 0 refills | Status: DC

## 2016-12-27 MED ORDER — VENLAFAXINE HCL ER 37.5 MG PO CP24: ORAL_CAPSULE | ORAL | 0 refills | Status: DC

## 2016-12-27 MED ORDER — ALPRAZOLAM 0.25 MG PO TABS: 0.2500 mg | ORAL_TABLET | Freq: Every evening | ORAL | 0 refills | Status: DC | PRN

## 2016-12-27 NOTE — Patient Instructions (Signed)

## 2016-12-27 NOTE — Assessment & Plan Note (Signed)
Dec xanax dose to 0.25 Start effexor  rto 1 month

## 2016-12-27 NOTE — Assessment & Plan Note (Signed)
Tolerating statin, encouraged heart healthy diet, avoid trans fats, minimize simple carbs and saturated fats. Increase exercise as tolerated 

## 2016-12-27 NOTE — Progress Notes (Signed)
Patient ID: Brianna Carroll, female   DOB: 03-11-1961, 56 y.o.   MRN: [id number]     Subjective:  I acted as a Education administrator for Dr. Carollee Herter.  Guerry Bruin, [place]   Patient ID: Brianna Carroll, female    DOB: August 03, 1960, 56 y.o.   MRN: [id number]  Chief Complaint  Patient presents with  . Anxiety  . Hypertension  . Hyperlipidemia    HPI  Patient is in today for follow up anxiety, blood pressure, and cholesterol. She stopped that alprazolam and now she is having trouble with sleeping.  Has been doing well on current cholesterol treatment.  She just started back on HCTZ about 2 weeks ago.  She had stopped it because she did not feel well when she took it.    Patient Care Team: Carollee Herter, Alferd Apa, DO as PCP - General (Family Medicine)   Past Medical History:  Diagnosis Date  . Allergic rhinitis   . Anemia, iron deficiency   . Anxiety   . Hearing loss   . Hyperlipidemia   . Low back pain syndrome   . Mitral valve prolapse   . Sinus headache   . Snoring     Past Surgical History:  Procedure Laterality Date  . CESAREAN SECTION  11/90, 12/95  . [place] SURGERY  2004  . TUBAL LIGATION  05/30/94    Family History  Problem Relation Age of Onset  . Cancer Father        Lung   . Cancer Mother        Lung Cancer  . Cancer Brother        throat cancer-heavy smoker  . COPD Sister     Social History   Social History  . Marital status: Married    Spouse name: [name]  . Number of children: 2  . 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 Smoking status: Never Smoker  . Smokeless tobacco: Never Used  . Alcohol use 0.6 oz/week    1 Standard drinks or equivalent per week  . Drug use: No  . Sexual activity: Yes    Partners: Male    Birth control/ protection: Surgical     Comment: BTL   Other Topics Concern  . Not on file   Social History Narrative  . No narrative on file    Outpatient  Medications Prior to Visit  Medication Sig Dispense Refill  . Fenofibrate Micronized (ANTARA) 90 MG CAPS Take 1 capsule by mouth daily. 90 capsule 1  . fluticasone (FLONASE) 50 MCG/ACT nasal spray Place 2 sprays into both nostrils daily. 16 g 11  . hydrochlorothiazide (HYDRODIURIL) 25 MG tablet Take 1 tablet (25 mg total) by mouth daily. 90 tablet 3  . loratadine (CLARITIN) 10 MG tablet TAKE ONE TABLET BY MOUTH ONCE DAILY 30 tablet 3  . naproxen (NAPROSYN) 500 MG tablet TAKE ONE TABLET BY MOUTH TWICE DAILY WITH A MEAL 60 tablet 1  . simvastatin (ZOCOR) 40 MG tablet Take 1 tablet (40 mg total) by mouth at bedtime. 30 tablet 0  . ALPRAZolam (XANAX) 0.5 MG tablet Take 1 tablet (0.5 mg total) by mouth 3 (three) times daily as needed. for anxiety (Patient not taking: Reported on 12/27/2016) 30 tablet 0  . azithromycin (ZITHROMAX) 250 MG tablet Take 2 tablets by mouth on day 1, followed by 1 tablet by mouth daily for 4 days. 6 tablet 0  .  benzonatate (TESSALON) 100 MG capsule Take 1 capsule (100 mg total) by mouth 3 (three) times daily as needed for cough. 21 capsule 0  . fluticasone (FLONASE) 50 MCG/ACT nasal spray Place 2 sprays into both nostrils daily. 16 g 1  . traMADol (ULTRAM) 50 MG tablet TAKE ONE TABLET BY MOUTH THREE TIMES DAILY AS NEEDED FOR PAIN 90 tablet 5   No facility-administered medications prior to visit.     Allergies  Allergen Reactions  . Doxycycline Rash    REACTION: hives  . Codeine     REACTION: nausea and vomiting  . Hydrocodone-Acetaminophen     REACTION: vomiting  . Hydrocodone-Homatropine     REACTION: nausea and vomiting    Review of Systems  Constitutional: Negative for fever and malaise/fatigue.  HENT: Negative for congestion.   Eyes: Negative for blurred vision.  Respiratory: Negative for cough and shortness of breath.   Cardiovascular: Negative for chest pain, palpitations and leg swelling.  Gastrointestinal: Negative for vomiting.  Musculoskeletal:  Negative for back pain.  Skin: Negative for rash.  Neurological: Negative for loss of consciousness and headaches.  Psychiatric/Behavioral: The patient has insomnia.        Objective:    Physical Exam  Constitutional: [name], place, and time. She appears well-developed and well-nourished. No distress.  HENT:  Head: Normocephalic and atraumatic.  Eyes: Conjunctivae are normal.  Neck: Normal range of motion. No thyromegaly present.  Cardiovascular: Normal rate and regular rhythm.   Pulmonary/Chest: Effort normal and breath sounds normal. She has no wheezes.  Abdominal: Soft. Bowel sounds are normal. There is no tenderness.  Musculoskeletal: Normal range of motion. She exhibits no edema or deformity.  Neurological: She is alert and oriented to person, place, and time.  Skin: Skin is warm and dry. She is not diaphoretic.  Psychiatric: She has a normal mood and affect.    BP 130/86 (BP Location: Right Arm, Cuff Size: Normal)   Pulse 64   Temp 98.1 F (36.7 C) (Oral)   Resp 16   Ht 5\' 1"  (1.549 m)   Wt 171 lb 3.2 oz (77.7 kg)   LMP 09/29/2013   SpO2 98%   BMI 32.35 kg/m  Wt Readings from Last 3 Encounters:  12/27/16 171 lb 3.2 oz (77.7 kg)  05/22/16 168 lb (76.2 kg)  03/21/16 161 lb 6.4 oz (73.2 kg)   BP Readings from Last 3 Encounters:  12/27/16 130/86  05/22/16 114/72  03/21/16 116/70     Immunization History  Administered Date(s) Administered  . Influenza Whole 02/26/2010, 04/15/2011  . Influenza-Unspecified 04/17/2014  . Tdap 11/25/2013    Health Maintenance  Topic Date Due  . HIV Screening  05/26/1975  . INFLUENZA VACCINE  01/15/2017  . PAP SMEAR  12/15/2017  . MAMMOGRAM  03/11/2018  . COLONOSCOPY  01/23/2022  . TETANUS/TDAP  11/26/2023  . Hepatitis C Screening  Completed    Lab Results  Component Value Date   WBC 4.1 (A) 08/20/2015   HGB 13.0 08/20/2015   HCT 36.8 (A) 08/20/2015   PLT 317.0 10/23/2011   GLUCOSE 92 09/18/2015    CHOL 133 09/18/2015   TRIG 83.0 09/18/2015   HDL 47.30 09/18/2015   LDLDIRECT 94.4 10/23/2011   LDLCALC 69 09/18/2015   ALT 17 09/18/2015   AST 22 09/18/2015   NA 140 09/18/2015   K 4.3 09/18/2015   CL 104 09/18/2015   CREATININE 0.86 09/18/2015   BUN 13 09/18/2015   CO2 30  09/18/2015   TSH 1.73 09/18/2015    Lab Results  Component Value Date   TSH 1.73 09/18/2015   Lab Results  Component Value Date   WBC 4.1 (A) 08/20/2015   HGB 13.0 08/20/2015   HCT 36.8 (A) 08/20/2015   MCV 89.7 08/20/2015   PLT 317.0 10/23/2011   Lab Results  Component Value Date   NA 140 09/18/2015   K 4.3 09/18/2015   CO2 30 09/18/2015   GLUCOSE 92 09/18/2015   BUN 13 09/18/2015   CREATININE 0.86 09/18/2015   BILITOT 0.4 09/18/2015   ALKPHOS 43 09/18/2015   AST 22 09/18/2015   ALT 17 09/18/2015   PROT 7.0 09/18/2015   ALBUMIN 4.5 09/18/2015   CALCIUM 9.8 09/18/2015   GFR 72.73 09/18/2015   Lab Results  Component Value Date   CHOL 133 09/18/2015   Lab Results  Component Value Date   HDL 47.30 09/18/2015   Lab Results  Component Value Date   LDLCALC 69 09/18/2015   Lab Results  Component Value Date   TRIG 83.0 09/18/2015   Lab Results  Component Value Date   CHOLHDL 3 09/18/2015   No results found for: HGBA1C       Assessment & Plan:   Problem List Items Addressed This Visit      Unprioritized   Generalized anxiety disorder    Dec xanax dose to 0.25 Start effexor  rto 1 month      Relevant Medications   ALPRAZolam (XANAX) 0.25 MG tablet   venlafaxine XR (EFFEXOR XR) 37.5 MG 24 hr capsule   Hyperlipidemia LDL goal <100    Tolerating statin, encouraged heart healthy diet, avoid trans fats, minimize simple carbs and saturated fats. Increase exercise as tolerated       Other Visit Diagnoses    Hyperlipidemia, unspecified hyperlipidemia type    -  Primary   Relevant Orders   Comprehensive metabolic panel   Lipid panel      I have discontinued Ms. Estill's  traMADol, benzonatate, azithromycin, and ALPRAZolam. I have also changed her venlafaxine XR. Additionally, I am having her start on ALPRAZolam. Lastly, I am having her maintain her fluticasone, Fenofibrate Micronized, hydrochlorothiazide, loratadine, naproxen, and simvastatin.  Meds ordered this encounter  Medications  . ALPRAZolam (XANAX) 0.25 MG tablet    Sig: Take 1 tablet (0.25 mg total) by mouth at bedtime as needed for sleep.    Dispense:  20 tablet    Refill:  0  . DISCONTD: venlafaxine XR (EFFEXOR XR) 37.5 MG 24 hr capsule    Sig: Take 1 capsule (37.5 mg total) by mouth daily with breakfast.    Dispense:  60 capsule    Refill:  0  . venlafaxine XR (EFFEXOR XR) 37.5 MG 24 hr capsule    Sig: 1 po qd x 1 week then 2 po q day    Dispense:  60 capsule    Refill:  0    CMA served as scribe during this visit. History, Physical and Plan performed by medical provider. Documentation and orders reviewed and attested to.  Ann Held, DO

## 2017-01-21 ENCOUNTER — Other Ambulatory Visit: Payer: Self-pay | Admitting: Family Medicine

## 2017-01-21 DIAGNOSIS — F411 Generalized anxiety disorder: Secondary | ICD-10-CM

## 2017-01-21 NOTE — Telephone Encounter (Signed)
Rx printed, awaiting MD signature.  

## 2017-01-21 NOTE — Telephone Encounter (Signed)
Requesting:   alprazolam Contract    09/02/2016 UDS   Low risk next is due on 03/05/2017 Last OV    12/27/2016 Last Refill    #20 no refills on 12/27/2016  Please Advise

## 2017-01-21 NOTE — Telephone Encounter (Signed)
Rx faxed to [facility] pharmacy  

## 2017-01-21 NOTE — Telephone Encounter (Signed)
Okay #20, no refills

## 2017-02-17 ENCOUNTER — Other Ambulatory Visit: Payer: Self-pay | Admitting: Family Medicine

## 2017-02-17 DIAGNOSIS — F411 Generalized anxiety disorder: Secondary | ICD-10-CM

## 2017-02-18 ENCOUNTER — Other Ambulatory Visit: Payer: Self-pay | Admitting: Family Medicine

## 2017-02-18 ENCOUNTER — Other Ambulatory Visit: Payer: Self-pay

## 2017-02-18 DIAGNOSIS — Z1231 Encounter for screening mammogram for malignant neoplasm of breast: Secondary | ICD-10-CM

## 2017-02-18 DIAGNOSIS — F411 Generalized anxiety disorder: Secondary | ICD-10-CM

## 2017-02-18 MED ORDER — SIMVASTATIN 40 MG PO TABS: 40.0000 mg | ORAL_TABLET | Freq: Every day | ORAL | 0 refills | Status: DC

## 2017-02-18 MED ORDER — VENLAFAXINE HCL ER 37.5 MG PO CP24: ORAL_CAPSULE | ORAL | 0 refills | Status: DC

## 2017-02-18 MED ORDER — NAPROXEN 500 MG PO TABS: 500.0000 mg | ORAL_TABLET | Freq: Two times a day (BID) | ORAL | Status: DC

## 2017-02-18 NOTE — Telephone Encounter (Signed)
Requesting: Alprazolam 0.5mg  1tid prn Looks like on 7.13.18 this was changed to 0.25mg  1qhs prn as she had reported that she wasn't taking?   Contract: Yes UDS: 3.19.18 low-risk next screen 9.19.18 Last OV: 7.13.18 Next OV: 11.09.18 Last Refill: 5.24.18 (for the 0.5mg )   Please advise

## 2017-02-18 NOTE — Telephone Encounter (Signed)
Refill 0.25

## 2017-02-19 MED ORDER — ALPRAZOLAM 0.25 MG PO TABS: 0.2500 mg | ORAL_TABLET | Freq: Every evening | ORAL | 0 refills | Status: DC | PRN

## 2017-02-19 NOTE — Telephone Encounter (Signed)
Printed Xanax 0.25mg  per YL/thx dmf

## 2017-02-20 NOTE — Telephone Encounter (Signed)
Faxed signed Alprazolam Rx to pharm on file per YL/thx dmf

## 2017-03-12 ENCOUNTER — Ambulatory Visit
Admission: RE | Admit: 2017-03-12 | Discharge: 2017-03-12 | Disposition: A | Discharge status: Home / Self Care | Payer: BC Managed Care – PPO | Source: Ambulatory Visit | Attending: Family Medicine | Admitting: Family Medicine

## 2017-03-12 DIAGNOSIS — Z1231 Encounter for screening mammogram for malignant neoplasm of breast: Secondary | ICD-10-CM

## 2017-03-13 ENCOUNTER — Other Ambulatory Visit: Payer: Self-pay | Admitting: Family Medicine

## 2017-03-13 DIAGNOSIS — F411 Generalized anxiety disorder: Secondary | ICD-10-CM

## 2017-03-24 ENCOUNTER — Other Ambulatory Visit: Payer: Self-pay | Admitting: Family Medicine

## 2017-03-24 DIAGNOSIS — F411 Generalized anxiety disorder: Secondary | ICD-10-CM

## 2017-03-24 NOTE — Telephone Encounter (Signed)
Requesting:   alprazolam Contract    09/02/2016 UDS    Low risk due on 03/05/2017 Last OV     12/27/2016 Last Refill     #20 no refills on 02/19/2017  Please Advise

## 2017-03-27 ENCOUNTER — Telehealth: Payer: Self-pay | Admitting: Family Medicine

## 2017-03-27 DIAGNOSIS — F411 Generalized anxiety disorder: Secondary | ICD-10-CM

## 2017-03-28 ENCOUNTER — Other Ambulatory Visit: Payer: Self-pay | Admitting: Family Medicine

## 2017-03-28 DIAGNOSIS — F411 Generalized anxiety disorder: Secondary | ICD-10-CM

## 2017-03-28 MED ORDER — ALPRAZOLAM 0.25 MG PO TABS: 0.2500 mg | ORAL_TABLET | Freq: Every evening | ORAL | 1 refills | Status: DC | PRN
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
## 2017-03-28 NOTE — Telephone Encounter (Signed)
This was refilled on 10.08.18/thx dmf

## 2017-03-28 NOTE — Telephone Encounter (Signed)
[facility] [address] [place] [phone] (Phone) [phone] (Fax)   Channah called to say that the pharmacy has not received this refill. Please advice patient.

## 2017-03-28 NOTE — Telephone Encounter (Signed)
Not sure what to do with this, unclear if medication was sent to pharmacy Monday 03/24/2017 Please advise

## 2017-04-25 ENCOUNTER — Other Ambulatory Visit: Payer: Self-pay

## 2017-04-25 ENCOUNTER — Ambulatory Visit: Payer: BC Managed Care – PPO | Admitting: Family Medicine

## 2017-04-25 ENCOUNTER — Other Ambulatory Visit (HOSPITAL_COMMUNITY)
Admission: RE | Admit: 2017-04-25 | Discharge: 2017-04-25 | Disposition: A | Discharge status: Home / Self Care | Payer: BC Managed Care – PPO | Source: Ambulatory Visit | Attending: Obstetrics & Gynecology | Admitting: Obstetrics & Gynecology

## 2017-04-25 ENCOUNTER — Ambulatory Visit: Payer: BC Managed Care – PPO | Admitting: Obstetrics & Gynecology

## 2017-04-25 ENCOUNTER — Encounter: Payer: Self-pay | Admitting: Obstetrics & Gynecology

## 2017-04-25 VITALS — BP 110/76 | HR 70 | Resp 14 | Ht 61.0 in | Wt 169.0 lb

## 2017-04-25 DIAGNOSIS — Z124 Encounter for screening for malignant neoplasm of cervix: Secondary | ICD-10-CM

## 2017-04-25 DIAGNOSIS — Z01419 Encounter for gynecological examination (general) (routine) without abnormal findings: Secondary | ICD-10-CM | POA: Diagnosis not present

## 2017-04-25 NOTE — Progress Notes (Signed)
56 y.o. G2P2 MarriedCaucasianF here for annual exam.  Doing well.  Denies vaginal bleeding.  Having minimal hot flashes.  Retired this past year.  Last day was September 28th.    PCP:  Has been seen this summer by Dr. Etter Sjogren  Patient's last menstrual period was 09/29/2013.          Sexually active: Yes.    The current method of family planning is tubal ligation.    Exercising: No.  The patient does not participate in regular exercise at present. Smoker:  no  Health Maintenance: Pap:  12/16/14 Neg.   10/27/12 Neg  History of abnormal Pap:  no MMG:  03/12/17 BIRADS1:Neg  Colonoscopy:  2017 normal.   BMD:   Never TDaP:  2015 Pneumonia vaccine(s):  No Zostavax:   No Hep C testing: 10/17 Neg  Screening Labs: PCP   reports that  has never smoked. she has never used smokeless tobacco. She reports that she drinks about 0.6 oz of alcohol per week. She reports that she does not use drugs.  Past Medical History:  Diagnosis Date  . Allergic rhinitis   . Anemia, iron deficiency   . Anxiety   . Hearing loss   . Hyperlipidemia   . Low back pain syndrome   . Mitral valve prolapse   . Sinus headache   . Snoring     Past Surgical History:  Procedure Laterality Date  . CESAREAN SECTION  11/90, 12/95  . [place] SURGERY  2004  . TUBAL LIGATION  05/30/94    Current Outpatient Medications  Medication Sig Dispense Refill  . ALPRAZolam (XANAX) 0.25 MG tablet Take 1 tablet (0.25 mg total) by mouth at bedtime as needed. for sleep 30 tablet 1  . ANTARA 90 MG CAPS TAKE ONE CAPSULE BY MOUTH ONCE DAILY 90 capsule 1  . fluticasone (FLONASE) 50 MCG/ACT nasal spray Place 2 sprays into both nostrils daily. 16 g 11  . hydrochlorothiazide (HYDRODIURIL) 25 MG tablet Take 1 tablet (25 mg total) by mouth daily. 90 tablet 3  . loratadine (CLARITIN) 10 MG tablet TAKE ONE TABLET BY MOUTH ONCE DAILY 30 tablet 3  . naproxen (NAPROSYN) 500 MG tablet Take 1 tablet (500 mg total) by mouth 2 (two) times daily  with a meal. 90 tablet 180  . simvastatin (ZOCOR) 40 MG tablet Take 1 tablet (40 mg total) by mouth at bedtime. 90 tablet 0  . venlafaxine XR (EFFEXOR XR) 37.5 MG 24 hr capsule 1 po qd x 1 week then 2 po q day 180 capsule 0   No current facility-administered medications for this visit.     Family History  Problem Relation Age of Onset  . Cancer Father        Lung   . Cancer Mother        Lung Cancer  . Cancer Brother        throat cancer-heavy smoker  . COPD Sister     ROS:  Pertinent items are noted in HPI.  Otherwise, a comprehensive ROS was negative.  Exam:   BP 110/76 (BP Location: Right Arm, Patient Position: Sitting, Cuff Size: Normal)   Pulse 70   Resp 14   Ht 5\' 1"  (1.549 m)   Wt 169 lb (76.7 kg)   LMP 09/29/2013   BMI 31.93 kg/m   Weight change:+8#  Height: 5\' 1"  (154.9 cm)  Ht Readings from Last 3 Encounters:  04/25/17 5\' 1"  (1.549 m)  12/27/16 5\' 1"  (1.549 m)  05/22/16 5' 0.75" (1.543 m)    General appearance: alert, cooperative and appears stated age Head: Normocephalic, without obvious abnormality, atraumatic Neck: no adenopathy, supple, symmetrical, trachea midline and thyroid normal to inspection and palpation Lungs: clear to auscultation bilaterally Breasts: normal appearance, no masses or tenderness Heart: regular rate and rhythm Abdomen: soft, non-tender; bowel sounds normal; no masses,  no organomegaly Extremities: extremities normal, atraumatic, no cyanosis or edema Skin: Skin color, texture, turgor normal. No rashes or lesions Lymph nodes: Cervical, supraclavicular, and axillary nodes normal. No abnormal inguinal nodes palpated Neurologic: Grossly normal    Pelvic: External genitalia:  no lesions              Urethra:  normal appearing urethra with no masses, tenderness or lesions              Bartholins and Skenes: normal                 Vagina: normal appearing vagina with normal color and discharge, no lesions              Cervix: no  lesions              Pap taken: Yes.   Bimanual Exam:  Uterus:  normal size, contour, position, consistency, mobility, non-tender              Adnexa: normal adnexa and no mass, fullness, tenderness               Rectovaginal: Confirms               Anus:  normal sphincter tone, no lesions  Chaperone was present for exam.  A:  Well Woman with normal exam PMP, no HRT DDD Anxiety Elevated lipids  P:   Mammogram guidelines reviewed.  Doing 3D MMG. pap smear and HR HPV obtained today Lab work done with Dr. Etter Sjogren Release of colonoscopy will be signed today Return annually or prn

## 2017-04-29 LAB — CYTOLOGY - PAP
DIAGNOSIS: NEGATIVE
HPV (WINDOPATH): NOT DETECTED

## 2017-05-20 ENCOUNTER — Other Ambulatory Visit: Payer: Self-pay | Admitting: Family Medicine

## 2017-05-20 ENCOUNTER — Other Ambulatory Visit: Payer: Self-pay | Admitting: Medical

## 2017-05-20 DIAGNOSIS — F411 Generalized anxiety disorder: Secondary | ICD-10-CM

## 2017-05-21 NOTE — Telephone Encounter (Signed)
[facility] [place], [place] requesting refill for alprazolam  Database ran and is on your desk for review.  Last filled per database:  04/24/17 Last written:  03/28/17  Last ov: 12/27/16 Next ov: none Contract: 09/02/16 UDS: Due

## 2017-07-24 ENCOUNTER — Other Ambulatory Visit: Payer: Self-pay | Admitting: Family Medicine

## 2017-07-24 DIAGNOSIS — F411 Generalized anxiety disorder: Secondary | ICD-10-CM

## 2017-07-25 NOTE — Telephone Encounter (Signed)
Requesting: Alprazolam Contract: Yes UDS: 3.19.2018 Low-risk; next screen 9.19.2018 Last OV: 7.13.2018 Next OV: 2.12.2019 Last Refill: 1.10.2019   Please advise

## 2017-07-29 ENCOUNTER — Encounter: Payer: Self-pay | Admitting: Family Medicine

## 2017-07-29 ENCOUNTER — Ambulatory Visit: Payer: BC Managed Care – PPO | Admitting: Family Medicine

## 2017-07-29 VITALS — BP 110/70 | HR 62 | Temp 98.3°F | Resp 16 | Ht 61.0 in | Wt 168.4 lb

## 2017-07-29 DIAGNOSIS — R011 Cardiac murmur, unspecified: Secondary | ICD-10-CM | POA: Diagnosis not present

## 2017-07-29 DIAGNOSIS — G4700 Insomnia, unspecified: Secondary | ICD-10-CM

## 2017-07-29 DIAGNOSIS — R232 Flushing: Secondary | ICD-10-CM

## 2017-07-29 DIAGNOSIS — E785 Hyperlipidemia, unspecified: Secondary | ICD-10-CM | POA: Diagnosis not present

## 2017-07-29 DIAGNOSIS — I10 Essential (primary) hypertension: Secondary | ICD-10-CM

## 2017-07-29 DIAGNOSIS — F411 Generalized anxiety disorder: Secondary | ICD-10-CM

## 2017-07-29 MED ORDER — VENLAFAXINE HCL ER 75 MG PO CP24: 75.0000 mg | ORAL_CAPSULE | Freq: Every day | ORAL | 2 refills | Status: DC

## 2017-07-29 MED ORDER — TRAZODONE HCL 50 MG PO TABS
25.0000 mg | ORAL_TABLET | Freq: Every evening | ORAL | 3 refills | Status: DC | PRN
Start: 2017-07-29 — End: 2017-11-24

## 2017-07-29 NOTE — Progress Notes (Signed)
Patient ID: Brianna Carroll, female   DOB: Aug 22, 1960, 57 y.o.   MRN: [id number]    Subjective:  I acted as a Education administrator for Dr. Carollee Herter.  Guerry Bruin, [place]   Patient ID: Brianna Carroll, female    DOB: 1961/01/04, 57 y.o.   MRN: [id number]  Chief Complaint  Patient presents with  . Hyperlipidemia  . Insomnia    HPI  Patient is in today for follow up cholesterol and insomnia.  She would like to change xanax to her old drug she use to take.  It does not work.  She would also like to discuss effexor.  Should she be taking medication.  Patient Care Team: Carollee Herter, Alferd Apa, DO as PCP - General (Family Medicine)   Past Medical History:  Diagnosis Date  . Allergic rhinitis   . Anemia, iron deficiency   . Anxiety   . Hearing loss   . Hyperlipidemia   . Low back pain syndrome   . Mitral valve prolapse   . Sinus headache   . Snoring     Past Surgical History:  Procedure Laterality Date  . CESAREAN SECTION  11/90, 12/95  . [place] SURGERY  2004  . TUBAL LIGATION  05/30/94    Family History  Problem Relation Age of Onset  . Cancer Father        Lung   . Cancer Mother        Lung Cancer  . Cancer Brother        throat cancer-heavy smoker  . COPD Sister     Social History   Socioeconomic History  . Marital status: Married    Spouse name: [name]  . Number of children: 2  . Years of education: Not on file  . Highest education level: Not on file  Social Needs  . Financial resource strain: Not on file  . Food insecurity - worry: Not on file  . Food insecurity - inability: Not on file  . Transportation needs - medical: Not on file  . Transportation needs - non-medical: Not on file  Occupational History  . Occupation: Product/process development scientist: [employer]  Tobacco Use  . Smoking status: Never Smoker  . Smokeless tobacco: Never Used  Substance and Sexual Activity  . Alcohol use: Yes    Alcohol/week: 0.6 oz    Types: 1 Standard drinks or equivalent per  week  . Drug use: No  . Sexual activity: Yes    Partners: Male    Birth control/protection: Surgical    Comment: BTL  Other Topics Concern  . Not on file  Social History Narrative  . Not on file    Outpatient Medications Prior to Visit  Medication Sig Dispense Refill  . ALPRAZolam (XANAX) 0.25 MG tablet TAKE 1 TABLET BY MOUTH AT BEDTIME AS NEEDED FOR SLEEP 30 tablet 1  . ANTARA 90 MG CAPS TAKE ONE CAPSULE BY MOUTH ONCE DAILY 90 capsule 1  . fluticasone (FLONASE) 50 MCG/ACT nasal spray Place 2 sprays into both nostrils daily. 16 g 11  . loratadine (CLARITIN) 10 MG tablet TAKE 1 TABLET BY MOUTH ONCE DAILY 30 tablet 3  . naproxen (NAPROSYN) 500 MG tablet Take 1 tablet (500 mg total) by mouth 2 (two) times daily with a meal. 90 tablet 180  . simvastatin (ZOCOR) 40 MG tablet TAKE 1 TABLET BY MOUTH AT BEDTIME 90 tablet 0  . venlafaxine XR (EFFEXOR XR) 37.5 MG 24 hr capsule 1 po qd x  1 week then 2 po q day 180 capsule 0   No facility-administered medications prior to visit.     Allergies  Allergen Reactions  . Doxycycline Rash    REACTION: hives  . Codeine     REACTION: nausea and vomiting  . Hydrocodone-Acetaminophen     REACTION: vomiting  . Hydrocodone-Homatropine     REACTION: nausea and vomiting    Review of Systems  Constitutional: Negative for fever and malaise/fatigue.  HENT: Negative for congestion.   Eyes: Negative for blurred vision.  Respiratory: Negative for cough and shortness of breath.   Cardiovascular: Negative for chest pain, palpitations and leg swelling.  Gastrointestinal: Negative for vomiting.  Musculoskeletal: Negative for back pain.  Skin: Negative for rash.  Neurological: Negative for loss of consciousness and headaches.  Psychiatric/Behavioral: The patient has insomnia.        Objective:    Physical Exam  Constitutional: [name], place, and time. She appears well-developed and well-nourished.  HENT:  Head: Normocephalic and  atraumatic.  Eyes: Conjunctivae and EOM are normal.  Neck: Normal range of motion. Neck supple. No JVD present. Carotid bruit is not present. No thyromegaly present.  Cardiovascular: Normal rate and regular rhythm.  Murmur heard. Pulmonary/Chest: Effort normal and breath sounds normal. No respiratory distress. She has no wheezes. She has no rales. She exhibits no tenderness.  Musculoskeletal: She exhibits no edema.  Neurological: She is alert and oriented to person, place, and time.  Psychiatric: She has a normal mood and affect.  Nursing note and vitals reviewed.   BP 110/70 (BP Location: Right Arm, Cuff Size: Normal)   Pulse 62   Temp 98.3 F (36.8 C) (Oral)   Resp 16   Ht 5\' 1"  (1.549 m)   Wt 168 lb 6.4 oz (76.4 kg)   LMP 09/29/2013   SpO2 98%   BMI 31.82 kg/m  Wt Readings from Last 3 Encounters:  07/29/17 168 lb 6.4 oz (76.4 kg)  04/25/17 169 lb (76.7 kg)  12/27/16 171 lb 3.2 oz (77.7 kg)   BP Readings from Last 3 Encounters:  07/29/17 110/70  04/25/17 110/76  12/27/16 130/86     Immunization History  Administered Date(s) Administered  . Influenza Whole 02/26/2010, 04/15/2011  . Influenza-Unspecified 04/17/2014  . Tdap 11/25/2013    Health Maintenance  Topic Date Due  . HIV Screening  05/26/1975  . INFLUENZA VACCINE  01/15/2017  . MAMMOGRAM  03/13/2019  . PAP SMEAR  04/25/2020  . TETANUS/TDAP  11/26/2023  . COLONOSCOPY  06/07/2026  . Hepatitis C Screening  Completed    Lab Results  Component Value Date   WBC 4.1 (A) 08/20/2015   HGB 13.0 08/20/2015   HCT 36.8 (A) 08/20/2015   PLT 317.0 10/23/2011   GLUCOSE 92 09/18/2015   CHOL 133 09/18/2015   TRIG 83.0 09/18/2015   HDL 47.30 09/18/2015   LDLDIRECT 94.4 10/23/2011   LDLCALC 69 09/18/2015   ALT 17 09/18/2015   AST 22 09/18/2015   NA 140 09/18/2015   K 4.3 09/18/2015   CL 104 09/18/2015   CREATININE 0.86 09/18/2015   BUN 13 09/18/2015   CO2 30 09/18/2015   TSH 1.73 09/18/2015    Lab Results    Component Value Date   TSH 1.73 09/18/2015   Lab Results  Component Value Date   WBC 4.1 (A) 08/20/2015   HGB 13.0 08/20/2015   HCT 36.8 (A) 08/20/2015   MCV 89.7 08/20/2015   PLT 317.0  10/23/2011   Lab Results  Component Value Date   NA 140 09/18/2015   K 4.3 09/18/2015   CO2 30 09/18/2015   GLUCOSE 92 09/18/2015   BUN 13 09/18/2015   CREATININE 0.86 09/18/2015   BILITOT 0.4 09/18/2015   ALKPHOS 43 09/18/2015   AST 22 09/18/2015   ALT 17 09/18/2015   PROT 7.0 09/18/2015   ALBUMIN 4.5 09/18/2015   CALCIUM 9.8 09/18/2015   GFR 72.73 09/18/2015   Lab Results  Component Value Date   CHOL 133 09/18/2015   Lab Results  Component Value Date   HDL 47.30 09/18/2015   Lab Results  Component Value Date   LDLCALC 69 09/18/2015   Lab Results  Component Value Date   TRIG 83.0 09/18/2015   Lab Results  Component Value Date   CHOLHDL 3 09/18/2015   No results found for: HGBA1C       Assessment & Plan:   Problem List Items Addressed This Visit      Unprioritized   Generalized anxiety disorder   Relevant Medications   traZODone (DESYREL) 50 MG tablet   venlafaxine XR (EFFEXOR XR) 75 MG 24 hr capsule   Hyperlipidemia LDL goal <100    Tolerating statin, encouraged heart healthy diet, avoid trans fats, minimize simple carbs and saturated fats. Increase exercise as tolerated      Relevant Orders   Lipid panel   Insomnia - Primary    Try trazadone qhs prn       Relevant Medications   traZODone (DESYREL) 50 MG tablet    Other Visit Diagnoses    Hot flashes       Relevant Medications   venlafaxine XR (EFFEXOR XR) 75 MG 24 hr capsule   Essential hypertension       Relevant Orders   Comprehensive metabolic panel   Cardiac murmur       Relevant Orders   ECHOCARDIOGRAM COMPLETE      I have discontinued Sherolyn Buba. Adrian's venlafaxine XR. I am also having her start on traZODone and venlafaxine XR. Additionally, I am having her maintain her fluticasone,  naproxen, ANTARA, loratadine, simvastatin, and ALPRAZolam.  Meds ordered this encounter  Medications  . traZODone (DESYREL) 50 MG tablet    Sig: Take 0.5-1 tablets (25-50 mg total) by mouth at bedtime as needed for sleep.    Dispense:  30 tablet    Refill:  3  . venlafaxine XR (EFFEXOR XR) 75 MG 24 hr capsule    Sig: Take 1 capsule (75 mg total) by mouth daily with breakfast.    Dispense:  30 capsule    Refill:  2    CMA served as scribe during this visit. History, Physical and Plan performed by medical provider. Documentation and orders reviewed and attested to.  Ann Held, DO

## 2017-07-29 NOTE — Patient Instructions (Signed)
Menopause Menopause is the normal time of life when menstrual periods stop completely. Menopause is complete when you have missed 12 consecutive menstrual periods. It usually occurs between the ages of 48 years and 55 years. Very rarely does a woman develop menopause before the age of 40 years. At menopause, your ovaries stop producing the female hormones estrogen and progesterone. This can cause undesirable symptoms and also affect your health. Sometimes the symptoms may occur 4-5 years before the menopause begins. There is no relationship between menopause and:  Oral contraceptives.  Number of children you had.  Race.  The age your menstrual periods started (menarche).  Heavy smokers and very thin women may develop menopause earlier in life. What are the causes?  The ovaries stop producing the female hormones estrogen and progesterone. Other causes include:  Surgery to remove both ovaries.  The ovaries stop functioning for no known reason.  Tumors of the pituitary gland in the brain.  Medical disease that affects the ovaries and hormone production.  Radiation treatment to the abdomen or pelvis.  Chemotherapy that affects the ovaries.  What are the signs or symptoms?  Hot flashes.  Night sweats.  Decrease in sex drive.  Vaginal dryness and thinning of the vagina causing painful intercourse.  Dryness of the skin and developing wrinkles.  Headaches.  Tiredness.  Irritability.  Memory problems.  Weight gain.  Bladder infections.  Hair growth of the face and chest.  Infertility. More serious symptoms include:  Loss of bone (osteoporosis) causing breaks (fractures).  Depression.  Hardening and narrowing of the arteries (atherosclerosis) causing heart attacks and strokes.  How is this diagnosed?  When the menstrual periods have stopped for 12 straight months.  Physical exam.  Hormone studies of the blood. How is this treated? There are many treatment  choices and nearly as many questions about them. The decisions to treat or not to treat menopausal changes is an individual choice made with your health care provider. Your health care provider can discuss the treatments with you. Together, you can decide which treatment will work best for you. Your treatment choices may include:  Hormone therapy (estrogen and progesterone).  Non-hormonal medicines.  Treating the individual symptoms with medicine (for example antidepressants for depression).  Herbal medicines that may help specific symptoms.  Counseling by a psychiatrist or psychologist.  Group therapy.  Lifestyle changes including: ? Eating healthy. ? Regular exercise. ? Limiting caffeine and alcohol. ? Stress management and meditation.  No treatment.  Follow these instructions at home:  Take the medicine your health care provider gives you as directed.  Get plenty of sleep and rest.  Exercise regularly.  Eat a diet that contains calcium (good for the bones) and soy products (acts like estrogen hormone).  Avoid alcoholic beverages.  Do not smoke.  If you have hot flashes, dress in layers.  Take supplements, calcium, and vitamin D to strengthen bones.  You can use over-the-counter lubricants or moisturizers for vaginal dryness.  Group therapy is sometimes very helpful.  Acupuncture may be helpful in some cases. Contact a health care provider if:  You are not sure you are in menopause.  You are having menopausal symptoms and need advice and treatment.  You are still having menstrual periods after age 55 years.  You have pain with intercourse.  Menopause is complete (no menstrual period for 12 months) and you develop vaginal bleeding.  You need a referral to a specialist (gynecologist, psychiatrist, or psychologist) for treatment. Get help right   away if:  You have severe depression.  You have excessive vaginal bleeding.  You fell and think you have a  broken bone.  You have pain when you urinate.  You develop leg or chest pain.  You have a fast pounding heart beat (palpitations).  You have severe headaches.  You develop vision problems.  You feel a lump in your breast.  You have abdominal pain or severe indigestion. This information is not intended to replace advice given to you by your health care provider. Make sure you discuss any questions you have with your health care provider. Document Released: 08/24/2003 Document Revised: 11/09/2015 Document Reviewed: 12/31/2012 Elsevier Interactive Patient Education  2017 Else[employer] Inc.  

## 2017-07-29 NOTE — Assessment & Plan Note (Signed)
Tolerating statin, encouraged heart healthy diet, avoid trans fats, minimize simple carbs and saturated fats. Increase exercise as tolerated 

## 2017-07-29 NOTE — Assessment & Plan Note (Signed)
Try trazadone qhs prn

## 2017-07-30 LAB — COMPREHENSIVE METABOLIC PANEL
ALK PHOS: 35 U/L — AB (ref 39–117)
ALT: 20 U/L (ref 0–35)
AST: 24 U/L (ref 0–37)
Albumin: 4.5 g/dL (ref 3.5–5.2)
BILIRUBIN TOTAL: 0.4 mg/dL (ref 0.2–1.2)
BUN: 18 mg/dL (ref 6–23)
CO2: 29 meq/L (ref 19–32)
CREATININE: 1.02 mg/dL (ref 0.40–1.20)
Calcium: 9.9 mg/dL (ref 8.4–10.5)
Chloride: 104 mEq/L (ref 96–112)
GFR: 59.33 mL/min — AB (ref >60.00)
GLUCOSE: 94 mg/dL (ref 70–99)
Potassium: 4.3 mEq/L (ref 3.5–5.1)
Sodium: 141 mEq/L (ref 135–145)
TOTAL PROTEIN: 7.2 g/dL (ref 6.0–8.3)

## 2017-07-30 LAB — LIPID PANEL
Cholesterol: 140 mg/dL (ref 0–200)
HDL: 45.7 mg/dL (ref >39.00)
LDL Cholesterol: 72 mg/dL (ref 0–99)
NONHDL: 93.9
TRIGLYCERIDES: 112 mg/dL (ref 0.0–149.0)
Total CHOL/HDL Ratio: 3
VLDL: 22.4 mg/dL (ref 0.0–40.0)

## 2017-08-01 ENCOUNTER — Encounter: Payer: Self-pay | Admitting: *Deleted

## 2017-08-05 ENCOUNTER — Other Ambulatory Visit (HOSPITAL_COMMUNITY): Payer: BC Managed Care – PPO

## 2017-08-20 ENCOUNTER — Other Ambulatory Visit: Payer: Self-pay | Admitting: Family Medicine

## 2017-08-20 ENCOUNTER — Other Ambulatory Visit: Payer: Self-pay

## 2017-08-20 ENCOUNTER — Ambulatory Visit (HOSPITAL_COMMUNITY): Payer: BC Managed Care – PPO | Attending: Cardiology

## 2017-08-20 DIAGNOSIS — I420 Dilated cardiomyopathy: Secondary | ICD-10-CM | POA: Insufficient documentation

## 2017-08-20 DIAGNOSIS — R011 Cardiac murmur, unspecified: Secondary | ICD-10-CM

## 2017-08-20 DIAGNOSIS — I5030 Unspecified diastolic (congestive) heart failure: Secondary | ICD-10-CM | POA: Diagnosis not present

## 2017-08-23 ENCOUNTER — Encounter: Payer: Self-pay | Admitting: Family Medicine

## 2017-08-25 NOTE — Telephone Encounter (Signed)
Weight loss will help cholesterol and bp---- we will need to periodically check her ---- and use meds when needed

## 2017-08-26 NOTE — Telephone Encounter (Signed)
Patient notified

## 2017-09-28 ENCOUNTER — Other Ambulatory Visit: Payer: Self-pay | Admitting: Family Medicine

## 2017-09-28 DIAGNOSIS — F411 Generalized anxiety disorder: Secondary | ICD-10-CM

## 2017-10-01 ENCOUNTER — Other Ambulatory Visit: Payer: Self-pay | Admitting: Medical

## 2017-11-24 ENCOUNTER — Other Ambulatory Visit: Payer: Self-pay | Admitting: Family Medicine

## 2017-11-24 ENCOUNTER — Encounter: Payer: Self-pay | Admitting: Family Medicine

## 2017-11-24 ENCOUNTER — Ambulatory Visit (INDEPENDENT_AMBULATORY_CARE_PROVIDER_SITE_OTHER): Payer: BC Managed Care – PPO | Admitting: Family Medicine

## 2017-11-24 VITALS — BP 116/78 | HR 57 | Temp 98.2°F | Resp 16 | Ht 60.7 in | Wt 168.4 lb

## 2017-11-24 DIAGNOSIS — G4700 Insomnia, unspecified: Secondary | ICD-10-CM | POA: Diagnosis not present

## 2017-11-24 DIAGNOSIS — G8929 Other chronic pain: Secondary | ICD-10-CM | POA: Diagnosis not present

## 2017-11-24 DIAGNOSIS — J302 Other seasonal allergic rhinitis: Secondary | ICD-10-CM

## 2017-11-24 DIAGNOSIS — M545 Low back pain: Secondary | ICD-10-CM

## 2017-11-24 DIAGNOSIS — Z0000 Encounter for general adult medical examination without abnormal findings: Secondary | ICD-10-CM

## 2017-11-24 DIAGNOSIS — E785 Hyperlipidemia, unspecified: Secondary | ICD-10-CM | POA: Diagnosis not present

## 2017-11-24 LAB — LIPID PANEL
Cholesterol: 175 mg/dL (ref 0–200)
HDL: 44.7 mg/dL (ref >39.00)
LDL Cholesterol: 105 mg/dL — ABNORMAL HIGH (ref 0–99)
NONHDL: 130.78
Total CHOL/HDL Ratio: 4
Triglycerides: 129 mg/dL (ref 0.0–149.0)
VLDL: 25.8 mg/dL (ref 0.0–40.0)

## 2017-11-24 LAB — CBC WITH DIFFERENTIAL/PLATELET
BASOS ABS: 0.1 10*3/uL (ref 0.0–0.1)
Basophils Relative: 1.1 % (ref 0.0–3.0)
Eosinophils Absolute: 0.2 10*3/uL (ref 0.0–0.7)
Eosinophils Relative: 1.8 % (ref 0.0–5.0)
HCT: 40.7 % (ref 36.0–46.0)
HEMOGLOBIN: 13.4 g/dL (ref 12.0–15.0)
LYMPHS ABS: 2 10*3/uL (ref 0.7–4.0)
Lymphocytes Relative: 22.6 % (ref 12.0–46.0)
MCHC: 33.1 g/dL (ref 30.0–36.0)
MCV: 93.4 fl (ref 78.0–100.0)
MONO ABS: 0.6 10*3/uL (ref 0.1–1.0)
Monocytes Relative: 6.6 % (ref 3.0–12.0)
NEUTROS PCT: 67.9 % (ref 43.0–77.0)
Neutro Abs: 6.1 10*3/uL (ref 1.4–7.7)
Platelets: 335 10*3/uL (ref 150.0–400.0)
RBC: 4.35 Mil/uL (ref 3.87–5.11)
RDW: 13.2 % (ref 11.5–15.5)
WBC: 9 10*3/uL (ref 4.0–10.5)

## 2017-11-24 LAB — COMPREHENSIVE METABOLIC PANEL
ALBUMIN: 4.3 g/dL (ref 3.5–5.2)
ALK PHOS: 44 U/L (ref 39–117)
ALT: 18 U/L (ref 0–35)
AST: 19 U/L (ref 0–37)
BILIRUBIN TOTAL: 0.4 mg/dL (ref 0.2–1.2)
BUN: 19 mg/dL (ref 6–23)
CO2: 29 mEq/L (ref 19–32)
CREATININE: 0.9 mg/dL (ref 0.40–1.20)
Calcium: 9.8 mg/dL (ref 8.4–10.5)
Chloride: 109 mEq/L (ref 96–112)
GFR: 68.47 mL/min (ref >60.00)
GLUCOSE: 91 mg/dL (ref 70–99)
Potassium: 5.5 mEq/L — ABNORMAL HIGH (ref 3.5–5.1)
SODIUM: 145 meq/L (ref 135–145)
TOTAL PROTEIN: 6.6 g/dL (ref 6.0–8.3)

## 2017-11-24 LAB — TSH: TSH: 2.8 u[IU]/mL (ref 0.35–4.50)

## 2017-11-24 MED ORDER — NAPROXEN 500 MG PO TABS: 500.0000 mg | ORAL_TABLET | Freq: Two times a day (BID) | ORAL | 5 refills | Status: DC

## 2017-11-24 MED ORDER — FENOFIBRATE MICRONIZED 90 MG PO CAPS: 1.0000 | ORAL_CAPSULE | Freq: Every day | ORAL | 1 refills | Status: DC

## 2017-11-24 MED ORDER — NAPROXEN 500 MG PO TABS: 500.0000 mg | ORAL_TABLET | Freq: Two times a day (BID) | ORAL | Status: DC

## 2017-11-24 MED ORDER — LORATADINE 10 MG PO TABS: 10.0000 mg | ORAL_TABLET | Freq: Every day | ORAL | 3 refills | Status: DC

## 2017-11-24 MED ORDER — TRAZODONE HCL 50 MG PO TABS: 25.0000 mg | ORAL_TABLET | Freq: Every evening | ORAL | 3 refills | Status: DC | PRN

## 2017-11-24 MED ORDER — SIMVASTATIN 40 MG PO TABS: 40.0000 mg | ORAL_TABLET | Freq: Every day | ORAL | 1 refills | Status: DC

## 2017-11-24 NOTE — Patient Instructions (Signed)
Preventive Care 40-64 Years, Female Preventive care refers to lifestyle choices and visits with your health care provider that can promote health and wellness. What does preventive care include?  A yearly physical exam. This is also called an annual well check.  Dental exams once or twice a year.  Routine eye exams. Ask your health care provider how often you should have your eyes checked.  Personal lifestyle choices, including: ? Daily care of your teeth and gums. ? Regular physical activity. ? Eating a healthy diet. ? Avoiding tobacco and drug use. ? Limiting alcohol use. ? Practicing safe sex. ? Taking low-dose aspirin daily starting at age 58. ? Taking vitamin and mineral supplements as recommended by your health care provider. What happens during an annual well check? The services and screenings done by your health care provider during your annual well check will depend on your age, overall health, lifestyle risk factors, and family history of disease. Counseling Your health care provider may ask you questions about your:  Alcohol use.  Tobacco use.  Drug use.  Emotional well-being.  Home and relationship well-being.  Sexual activity.  Eating habits.  Work and work Statistician.  Method of birth control.  Menstrual cycle.  Pregnancy history.  Screening You may have the following tests or measurements:  Height, weight, and BMI.  Blood pressure.  Lipid and cholesterol levels. These may be checked every 5 years, or more frequently if you are over 81 years old.  Skin check.  Lung cancer screening. You may have this screening every year starting at age 78 if you have a 30-pack-year history of smoking and currently smoke or have quit within the past 15 years.  Fecal occult blood test (FOBT) of the stool. You may have this test every year starting at age 65.  Flexible sigmoidoscopy or colonoscopy. You may have a sigmoidoscopy every 5 years or a colonoscopy  every 10 years starting at age 30.  Hepatitis C blood test.  Hepatitis B blood test.  Sexually transmitted disease (STD) testing.  Diabetes screening. This is done by checking your blood sugar (glucose) after you have not eaten for a while (fasting). You may have this done every 1-3 years.  Mammogram. This may be done every 1-2 years. Talk to your health care provider about when you should start having regular mammograms. This may depend on whether you have a family history of breast cancer.  BRCA-related cancer screening. This may be done if you have a family history of breast, ovarian, tubal, or peritoneal cancers.  Pelvic exam and Pap test. This may be done every 3 years starting at age 80. Starting at age 36, this may be done every 5 years if you have a Pap test in combination with an HPV test.  Bone density scan. This is done to screen for osteoporosis. You may have this scan if you are at high risk for osteoporosis.  Discuss your test results, treatment options, and if necessary, the need for more tests with your health care provider. Vaccines Your health care provider may recommend certain vaccines, such as:  Influenza vaccine. This is recommended every year.  Tetanus, diphtheria, and acellular pertussis (Tdap, Td) vaccine. You may need a Td booster every 10 years.  Varicella vaccine. You may need this if you have not been vaccinated.  Zoster vaccine. You may need this after age 5.  Measles, mumps, and rubella (MMR) vaccine. You may need at least one dose of MMR if you were born in  1958 or later. You may also need a second dose.  Pneumococcal 13-valent conjugate (PCV13) vaccine. You may need this if you have certain conditions and were not previously vaccinated.  Pneumococcal polysaccharide (PPSV23) vaccine. You may need one or two doses if you smoke cigarettes or if you have certain conditions.  Meningococcal vaccine. You may need this if you have certain  conditions.  Hepatitis A vaccine. You may need this if you have certain conditions or if you travel or work in places where you may be exposed to hepatitis A.  Hepatitis B vaccine. You may need this if you have certain conditions or if you travel or work in places where you may be exposed to hepatitis B.  Haemophilus influenzae type b (Hib) vaccine. You may need this if you have certain conditions.  Talk to your health care provider about which screenings and vaccines you need and how often you need them. This information is not intended to replace advice given to you by your health care provider. Make sure you discuss any questions you have with your health care provider. Document Released: 06/30/2015 Document Revised: 02/21/2016 Document Reviewed: 04/04/2015 Elsevier Interactive Patient Education  2018 Elsevier Inc.  

## 2017-11-24 NOTE — Progress Notes (Signed)
Subjective:     Brianna Carroll is a 57 y.o. female and is here for a comprehensive physical exam. The patient reports no problems.  Social History   Socioeconomic History  . Marital status: Married    Spouse name: [name]  . Number of children: 2  . Years of education: Not on file  . Highest education level: Not on file  Occupational History  . 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 Financial resource strain: Not on file  . Food insecurity:    Worry: Not on file    Inability: Not on file  . Transportation needs:    Medical: Not on file    Non-medical: Not on file  Tobacco Use  . Smoking status: Never Smoker  . Smokeless tobacco: Never Used  Substance and Sexual Activity  . Alcohol use: Yes    Alcohol/week: 0.6 oz    Types: 1 Standard drinks or equivalent per week  . Drug use: No  . Sexual activity: Yes    Partners: Male    Birth control/protection: Surgical    Comment: BTL  Lifestyle  . Physical activity:    Days per week: Not on file    Minutes per session: Not on file  . Stress: Not on file  Relationships  . Social connections:    Talks on phone: Not on file    Gets together: Not on file    Attends religious service: Not on file    Active member of club or organization: Not on file    Attends meetings of clubs or organizations: Not on file    Relationship status: Not on file  . Intimate partner violence:    Fear of current or ex partner: Not on file    Emotionally abused: Not on file    Physically abused: Not on file    Forced sexual activity: Not on file  Other Topics Concern  . Not on file  Social History Narrative  . Not on file   Health Maintenance  Topic Date Due  . HIV Screening  11/25/2023 (Originally 05/26/1976)  . INFLUENZA VACCINE  01/15/2018  . MAMMOGRAM  03/12/2018  . PAP SMEAR  04/25/2020  . TETANUS/TDAP  11/26/2023  . COLONOSCOPY  06/07/2026  . Hepatitis C Screening  Completed     The following portions of the patient's history were reviewed and updated as appropriate:  She  has a past medical history of Allergic rhinitis, Anemia, iron deficiency, Anxiety, Hearing loss, Hyperlipidemia, Low back pain syndrome, Mitral valve prolapse, Sinus headache, and Snoring. She does not have any pertinent problems on file. She  has a past surgical history that includes Tubal ligation (05/30/94); Cesarean section (11/90, 12/95); and Lumbar disc surgery (2004). Her family history includes COPD in her sister; Cancer in her brother, father, and mother. She  reports that she has never smoked. She has never used smokeless tobacco. She reports that she drinks about 0.6 oz of alcohol per week. She reports that she does not use drugs. She has a current medication list which includes the following prescription(s): alprazolam, fenofibrate micronized, fluticasone, loratadine, naproxen, simvastatin, and trazodone. Current Outpatient Medications on File Prior to Visit  Medication Sig Dispense Refill  . ALPRAZolam (XANAX) 0.25 MG tablet TAKE 1 TABLET BY MOUTH AT BEDTIME AS NEEDED FOR SLEEP 30 tablet 1  . fluticasone (FLONASE) 50 MCG/ACT nasal spray Place 2 sprays into both nostrils daily. [place]  g 11   No current facility-administered medications on file prior to visit.    She is allergic to doxycycline; codeine; hydrocodone-acetaminophen; and hydrocodone-homatropine..  Review of Systems Review of Systems  Constitutional: Negative for activity change, appetite change and fatigue.  HENT: Negative for hearing loss, congestion, tinnitus and ear discharge.  dentist q43m Eyes: Negative for visual disturbance (see optho q1y -- vision corrected to 20/20 with glasses).  Respiratory: Negative for cough, chest tightness and shortness of breath.   Cardiovascular: Negative for chest pain, palpitations and leg swelling.  Gastrointestinal: Negative for abdominal pain, diarrhea, constipation and abdominal  distention.  Genitourinary: Negative for urgency, frequency, decreased urine volume and difficulty urinating.  Musculoskeletal: Negative for back pain, arthralgias and gait problem.  Skin: Negative for color change, pallor and rash.  Neurological: Negative for dizziness, light-headedness, numbness and headaches.  Hematological: Negative for adenopathy. Does not bruise/bleed easily.  Psychiatric/Behavioral: Negative for suicidal ideas, confusion, sleep disturbance, self-injury, dysphoric mood, decreased concentration and agitation.       Objective:    BP 116/78 (BP Location: Right Arm, Cuff Size: Normal)   Pulse (!) 57   Temp 98.2 F (36.8 C) (Oral)   Resp 16   Ht 5' 0.7" (1.542 m)   Wt 168 lb 6.4 oz (76.4 kg)   LMP 09/29/2013   SpO2 98%   BMI 32.13 kg/m  General appearance: alert, cooperative, appears stated age and no distress Head: Normocephalic, without obvious abnormality, atraumatic Eyes: conjunctivae/corneas clear. PERRL, EOM's intact. Fundi benign. Ears: normal TM's and external ear canals both ears Nose: Nares normal. Septum midline. Mucosa normal. No drainage or sinus tenderness. Throat: lips, mucosa, and tongue normal; teeth and gums normal Neck: no adenopathy, no carotid bruit, no JVD, supple, symmetrical, trachea midline and thyroid not enlarged, symmetric, no tenderness/mass/nodules Back: symmetric, no curvature. ROM normal. No CVA tenderness. Lungs: clear to auscultation bilaterally Breasts: gyn Heart: + murmur 1-2/6 Abdomen: soft, non-tender; bowel sounds normal; no masses,  no organomegaly Pelvic: deferred--gyn Extremities: extremities normal, atraumatic, no cyanosis or edema Pulses: 2+ and symmetric Skin: Skin color, texture, turgor normal. No rashes or lesions Lymph nodes: Cervical, supraclavicular, and axillary nodes normal. Neurologic: Alert and oriented X 3, normal strength and tone. Normal symmetric reflexes. Normal coordination and gait     Assessment:    Healthy female exam.      Plan:    ghm utd Check labs  See After Visit Summary for Counseling Recommendations    1. Hyperlipidemia LDL goal <100 Tolerating statin, encouraged heart healthy diet, avoid trans fats, minimize simple carbs and saturated fats. Increase exercise as tolerated - CBC with Differential/Platelet - Comprehensive metabolic panel - Lipid panel - TSH - simvastatin (ZOCOR) 40 MG tablet; Take 1 tablet (40 mg total) by mouth at bedtime.  Dispense: 90 tablet; Refill: 1 - Fenofibrate Micronized (ANTARA) 90 MG CAPS; Take 1 capsule by mouth daily.  Dispense: 90 capsule; Refill: 1  2. Preventative health care See above - CBC with Differential/Platelet - Comprehensive metabolic panel - Lipid panel - TSH  3. Insomnia, unspecified type   - traZODone (DESYREL) 50 MG tablet; Take 0.5-1 tablets (25-50 mg total) by mouth at bedtime as needed for sleep.  Dispense: 30 tablet; Refill: 3  4. Seasonal allergies   - loratadine (CLARITIN) 10 MG tablet; Take 1 tablet (10 mg total) by mouth daily.  Dispense: 90 tablet; Refill: 3  5. Chronic low back pain without sciatica, unspecified back pain laterality   - naproxen (NAPROSYN)  500 MG tablet; Take 1 tablet (500 mg total) by mouth 2 (two) times daily with a meal.  Dispense: 60 tablet; Refill: 5

## 2017-11-27 ENCOUNTER — Other Ambulatory Visit: Payer: Self-pay | Admitting: *Deleted

## 2017-11-27 DIAGNOSIS — I10 Essential (primary) hypertension: Secondary | ICD-10-CM

## 2017-11-27 DIAGNOSIS — E785 Hyperlipidemia, unspecified: Secondary | ICD-10-CM

## 2017-12-02 ENCOUNTER — Encounter: Payer: Self-pay | Admitting: Family Medicine

## 2017-12-02 ENCOUNTER — Ambulatory Visit (HOSPITAL_BASED_OUTPATIENT_CLINIC_OR_DEPARTMENT_OTHER)
Admission: RE | Admit: 2017-12-02 | Discharge: 2017-12-02 | Disposition: A | Discharge status: Home / Self Care | Payer: BC Managed Care – PPO | Source: Ambulatory Visit | Attending: Family Medicine | Admitting: Family Medicine

## 2017-12-02 ENCOUNTER — Ambulatory Visit: Payer: BC Managed Care – PPO | Admitting: Family Medicine

## 2017-12-02 VITALS — BP 130/66 | HR 83 | Temp 98.2°F | Resp 16 | Ht 61.0 in | Wt 168.0 lb

## 2017-12-02 DIAGNOSIS — K5720 Diverticulitis of large intestine with perforation and abscess without bleeding: Secondary | ICD-10-CM | POA: Diagnosis not present

## 2017-12-02 DIAGNOSIS — R1032 Left lower quadrant pain: Secondary | ICD-10-CM

## 2017-12-02 DIAGNOSIS — R319 Hematuria, unspecified: Secondary | ICD-10-CM | POA: Diagnosis not present

## 2017-12-02 LAB — POC URINALSYSI DIPSTICK (AUTOMATED)
Bilirubin, UA: NEGATIVE
GLUCOSE UA: NEGATIVE
KETONES UA: NEGATIVE
Leukocytes, UA: NEGATIVE
Nitrite, UA: NEGATIVE
Protein, UA: POSITIVE — AB
SPEC GRAV UA: 1.02 (ref 1.010–1.025)
UROBILINOGEN UA: 1 U/dL
pH, UA: 6 (ref 5.0–8.0)

## 2017-12-02 LAB — COMPREHENSIVE METABOLIC PANEL
ALBUMIN: 4.1 g/dL (ref 3.5–5.2)
ALT: 12 U/L (ref 0–35)
AST: 12 U/L (ref 0–37)
Alkaline Phosphatase: 42 U/L (ref 39–117)
BILIRUBIN TOTAL: 0.4 mg/dL (ref 0.2–1.2)
BUN: 13 mg/dL (ref 6–23)
CO2: 28 meq/L (ref 19–32)
CREATININE: 0.98 mg/dL (ref 0.40–1.20)
Calcium: 9.7 mg/dL (ref 8.4–10.5)
Chloride: 108 mEq/L (ref 96–112)
GFR: 62.06 mL/min (ref >60.00)
Glucose, Bld: 131 mg/dL — ABNORMAL HIGH (ref 70–99)
Potassium: 4.8 mEq/L (ref 3.5–5.1)
SODIUM: 142 meq/L (ref 135–145)
Total Protein: 7.1 g/dL (ref 6.0–8.3)

## 2017-12-02 LAB — CBC WITH DIFFERENTIAL/PLATELET
BASOS ABS: 0 10*3/uL (ref 0.0–0.1)
Basophils Relative: 0.3 % (ref 0.0–3.0)
EOS ABS: 0.1 10*3/uL (ref 0.0–0.7)
Eosinophils Relative: 1.2 % (ref 0.0–5.0)
HEMATOCRIT: 37.3 % (ref 36.0–46.0)
HEMOGLOBIN: 12.3 g/dL (ref 12.0–15.0)
LYMPHS PCT: 13.6 % (ref 12.0–46.0)
Lymphs Abs: 1.5 10*3/uL (ref 0.7–4.0)
MCHC: 33 g/dL (ref 30.0–36.0)
MCV: 92.6 fl (ref 78.0–100.0)
Monocytes Absolute: 0.7 10*3/uL (ref 0.1–1.0)
Monocytes Relative: 5.8 % (ref 3.0–12.0)
Neutro Abs: 8.8 10*3/uL — ABNORMAL HIGH (ref 1.4–7.7)
Neutrophils Relative %: 79.1 % — ABNORMAL HIGH (ref 43.0–77.0)
PLATELETS: 375 10*3/uL (ref 150.0–400.0)
RBC: 4.03 Mil/uL (ref 3.87–5.11)
RDW: 13 % (ref 11.5–15.5)
WBC: 11.2 10*3/uL — AB (ref 4.0–10.5)

## 2017-12-02 MED ORDER — IOPAMIDOL (ISOVUE-300) INJECTION 61%
100.0000 mL | Freq: Once | INTRAVENOUS | Status: AC | PRN
  Administered 2017-12-02: 100 mL via INTRAVENOUS

## 2017-12-02 MED ORDER — AMOXICILLIN-POT CLAVULANATE 875-125 MG PO TABS: 1.0000 | ORAL_TABLET | Freq: Two times a day (BID) | ORAL | 0 refills | Status: DC

## 2017-12-02 MED FILL — AMOX-CLAV 875-125 MG TABLET: 875-125 | 10 days supply | Qty: 20 | Fill #0

## 2017-12-02 NOTE — Patient Instructions (Signed)
Diverticulitis °Diverticulitis is infection or inflammation of small pouches (diverticula) in the colon that form due to a condition called diverticulosis. Diverticula can trap stool (feces) and bacteria, causing infection and inflammation. °Diverticulitis may cause severe stomach pain and diarrhea. It may lead to tissue damage in the colon that causes bleeding. The diverticula may also burst (rupture) and cause infected stool to enter other areas of the abdomen. °Complications of diverticulitis can include: °· Bleeding. °· Severe infection. °· Severe pain. °· Rupture (perforation) of the colon. °· Blockage (obstruction) of the colon. ° °What are the causes? °This condition is caused by stool becoming trapped in the diverticula, which allows bacteria to grow in the diverticula. This leads to inflammation and infection. °What increases the risk? °You are more likely to develop this condition if: °· You have diverticulosis. The risk for diverticulosis increases if: °? You are overweight or obese. °? You use tobacco products. °? You do not get enough exercise. °· You eat a diet that does not include enough fiber. High-fiber foods include fruits, vegetables, beans, nuts, and whole grains. ° °What are the signs or symptoms? °Symptoms of this condition may include: °· Pain and tenderness in the abdomen. The pain is normally located on the left side of the abdomen, but it may occur in other areas. °· Fever and chills. °· Bloating. °· Cramping. °· Nausea. °· Vomiting. °· Changes in bowel routines. °· Blood in your stool. ° °How is this diagnosed? °This condition is diagnosed based on: °· Your medical history. °· A physical exam. °· Tests to make sure there is nothing else causing your condition. These tests may include: °? Blood tests. °? Urine tests. °? Imaging tests of the abdomen, including X-rays, ultrasounds, MRIs, or CT scans. ° °How is this treated? °Most cases of this condition are mild and can be treated at home.  Treatment may include: °· Taking over-the-counter pain medicines. °· Following a clear liquid diet. °· Taking antibiotic medicines by mouth. °· Rest. ° °More severe cases may need to be treated at a hospital. Treatment may include: °· Not eating or drinking. °· Taking prescription pain medicine. °· Receiving antibiotic medicines through an IV tube. °· Receiving fluids and nutrition through an IV tube. °· Surgery. ° °When your condition is under control, your health care provider may recommend that you have a colonoscopy. This is an exam to look at the entire large intestine. During the exam, a lubricated, bendable tube is inserted into the anus and then passed into the rectum, colon, and other parts of the large intestine. A colonoscopy can show how severe your diverticula are and whether something else may be causing your symptoms. °Follow these instructions at home: °Medicines °· Take over-the-counter and prescription medicines only as told by your health care provider. These include fiber supplements, probiotics, and stool softeners. °· If you were prescribed an antibiotic medicine, take it as told by your health care provider. Do not stop taking the antibiotic even if you start to feel better. °· Do not drive or use heavy machinery while taking prescription pain medicine. °General instructions °· Follow a full liquid diet or another diet as directed by your health care provider. After your symptoms improve, your health care provider may tell you to change your diet. He or she may recommend that you eat a diet that contains at least 25 g (25 grams) of fiber daily. Fiber makes it easier to pass stool. Healthy sources of fiber include: °? Berries. One cup   contains 4-8 grams of fiber. °? Beans or lentils. One half cup contains 5-8 grams of fiber. °? Green vegetables. One cup contains 4 grams of fiber. °· Exercise for at least 30 minutes, 3 times each week. You should exercise hard enough to raise your heart rate and  break a sweat. °· Keep all follow-up visits as told by your health care provider. This is important. You may need a colonoscopy. °Contact a health care provider if: °· Your pain does not improve. °· You have a hard time drinking or eating food. °· Your bowel movements do not return to normal. °Get help right away if: °· Your pain gets worse. °· Your symptoms do not get better with treatment. °· Your symptoms suddenly get worse. °· You have a fever. °· You vomit more than one time. °· You have stools that are bloody, black, or tarry. °Summary °· Diverticulitis is infection or inflammation of small pouches (diverticula) in the colon that form due to a condition called diverticulosis. Diverticula can trap stool (feces) and bacteria, causing infection and inflammation. °· You are at higher risk for this condition if you have diverticulosis and you eat a diet that does not include enough fiber. °· Most cases of this condition are mild and can be treated at home. More severe cases may need to be treated at a hospital. °· When your condition is under control, your health care provider may recommend that you have an exam called a colonoscopy. This exam can show how severe your diverticula are and whether something else may be causing your symptoms. °This information is not intended to replace advice given to you by your health care provider. Make sure you discuss any questions you have with your health care provider. °Document Released: 03/13/2005 Document Revised: 07/06/2016 Document Reviewed: 07/06/2016 °Elsevier Interactive Patient Education © 2018 [employer] ° °

## 2017-12-02 NOTE — Progress Notes (Signed)
Patient ID: Brianna Carroll, female   DOB: [birth year], 57 y.o.   MRN: [id number]     Subjective:  I acted as a Education administrator for Dr. Carollee Herter.  Guerry Bruin, [place]   Patient ID: Brianna Carroll, female    DOB: March 17, 1961, 57 y.o.   MRN: [id number]  Chief Complaint  Patient presents with  . Abdominal Pain  . Fever      Patient is in today for abdominal pain and fever since Friday.  No NVD.  Not affected by eating.   No fever today  Patient Care Team: Carollee Herter, Alferd Apa, DO as PCP - General (Family Medicine) Megan Salon, MD as Consulting Physician (Gynecology)   Past Medical History:  Diagnosis Date  . Allergic rhinitis   . Anemia, iron deficiency   . Anxiety   . Hearing loss   . Hyperlipidemia   . Low back pain syndrome   . Mitral valve prolapse   . Sinus headache   . Snoring     Past Surgical History:  Procedure Laterality Date  . CESAREAN SECTION  11/90, 12/95  . [place] SURGERY  2004  . TUBAL LIGATION  05/30/94    Family History  Problem Relation Age of Onset  . Cancer Father        Lung   . Cancer Mother        Lung Cancer  . Cancer Brother        throat cancer-heavy smoker  . COPD Sister     Social History   Socioeconomic History  . Marital status: Married    Spouse name: [name]  . Number of children: 2  . Years of education: Not on file  . Highest education level: Not on file  Occupational History  . 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 Financial resource strain: Not on file  . Food insecurity:    Worry: Not on file    Inability: Not on file  . Transportation needs:    Medical: Not on file    Non-medical: Not on file  Tobacco Use  . Smoking status: Never Smoker  . Smokeless tobacco: Never Used  Substance and Sexual Activity  . Alcohol use: Yes    Alcohol/week: 0.6 oz    Types: 1 Standard drinks or equivalent per week  . Drug use: No  . Sexual activity: Yes    Partners:  Male    Birth control/protection: Surgical    Comment: BTL  Lifestyle  . Physical activity:    Days per week: Not on file    Minutes per session: Not on file  . Stress: Not on file  Relationships  . Social connections:    Talks on phone: Not on file    Gets together: Not on file    Attends religious service: Not on file    Active member of club or organization: Not on file    Attends meetings of clubs or organizations: Not on file    Relationship status: Not on file  . Intimate partner violence:    Fear of current or ex partner: Not on file    Emotionally abused: Not on file    Physically abused: Not on file    Forced sexual activity: Not on file  Other Topics Concern  . Not on file  Social History Narrative  . Not on file    Outpatient Medications Prior to Visit  Medication  Sig Dispense Refill  . Fenofibrate Micronized (ANTARA) 90 MG CAPS Take 1 capsule by mouth daily. 90 capsule 1  . fluticasone (FLONASE) 50 MCG/ACT nasal spray Place 2 sprays into both nostrils daily. 16 g 11  . loratadine (CLARITIN) 10 MG tablet Take 1 tablet (10 mg total) by mouth daily. 90 tablet 3  . naproxen (NAPROSYN) 500 MG tablet Take 1 tablet (500 mg total) by mouth 2 (two) times daily with a meal. 60 tablet 5  . simvastatin (ZOCOR) 40 MG tablet Take 1 tablet (40 mg total) by mouth at bedtime. 90 tablet 1  . traZODone (DESYREL) 50 MG tablet Take 0.5-1 tablets (25-50 mg total) by mouth at bedtime as needed for sleep. 30 tablet 3   No facility-administered medications prior to visit.     Allergies  Allergen Reactions  . Doxycycline Rash    REACTION: hives  . Codeine     REACTION: nausea and vomiting  . Hydrocodone-Acetaminophen     REACTION: vomiting  . Hydrocodone-Homatropine     REACTION: nausea and vomiting    Review of Systems  Constitutional: Positive for fever (101 yesterday). Negative for chills and malaise/fatigue.  HENT: Negative for congestion and hearing loss.   Eyes: Negative  for discharge.  Respiratory: Negative for cough, sputum production and shortness of breath.   Cardiovascular: Negative for chest pain, palpitations and leg swelling.  Gastrointestinal: Positive for abdominal pain and nausea (during pain). Negative for blood in stool, constipation, diarrhea, flatus, heartburn, melena and vomiting.  Genitourinary: Negative for dysuria, frequency, hematuria and urgency.  Musculoskeletal: Negative for back pain, falls and myalgias.  Skin: Negative for rash.  Neurological: Negative for dizziness, sensory change, loss of consciousness, weakness and headaches.  Endo/Heme/Allergies: Negative for environmental allergies. Does not bruise/bleed easily.  Psychiatric/Behavioral: Negative for depression and suicidal ideas. The patient is not nervous/anxious and does not have insomnia.        Objective:    Physical Exam  Constitutional: [name], place, and time. She appears well-developed and well-nourished.  HENT:  Head: Normocephalic and atraumatic.  Eyes: Conjunctivae and EOM are normal.  Neck: Normal range of motion. Neck supple. No JVD present. Carotid bruit is not present. No thyromegaly present.  Cardiovascular: Normal rate, regular rhythm and normal heart sounds.  No murmur heard. Pulmonary/Chest: Effort normal and breath sounds normal. No respiratory distress. She has no wheezes. She has no rales. She exhibits no tenderness.  Abdominal: There is tenderness in the suprapubic area and left lower quadrant. There is guarding. There is no rigidity, no rebound, no tenderness at McBurney's point and negative Murphy's sign.  Musculoskeletal: She exhibits no edema.  Neurological: She is alert and oriented to person, place, and time.  Psychiatric: She has a normal mood and affect.  Nursing note and vitals reviewed.   BP 130/66 (BP Location: Right Arm, Cuff Size: Normal)   Pulse 83   Temp 98.2 F (36.8 C) (Oral)   Resp 16   Ht 5\' 1"  (1.549 m)    Wt 168 lb (76.2 kg)   LMP 09/29/2013   SpO2 98%   BMI 31.74 kg/m  Wt Readings from Last 3 Encounters:  12/02/17 168 lb (76.2 kg)  11/24/17 168 lb 6.4 oz (76.4 kg)  07/29/17 168 lb 6.4 oz (76.4 kg)   BP Readings from Last 3 Encounters:  12/02/17 130/66  11/24/17 116/78  07/29/17 110/70     Immunization History  Administered Date(s) Administered  . Influenza Whole 02/26/2010,  04/15/2011  . Influenza-Unspecified 04/17/2014  . Tdap 11/25/2013    Health Maintenance  Topic Date Due  . HIV Screening  11/25/2023 (Originally 05/26/1975)  . INFLUENZA VACCINE  01/15/2018  . MAMMOGRAM  03/12/2018  . PAP SMEAR  04/25/2020  . TETANUS/TDAP  11/26/2023  . COLONOSCOPY  06/07/2026  . Hepatitis C Screening  Completed    Lab Results  Component Value Date   WBC 11.2 (H) 12/02/2017   HGB 12.3 12/02/2017   HCT 37.3 12/02/2017   PLT 375.0 12/02/2017   GLUCOSE 131 (H) 12/02/2017   CHOL 175 11/24/2017   TRIG 129.0 11/24/2017   HDL 44.70 11/24/2017   LDLDIRECT 94.4 10/23/2011   LDLCALC 105 (H) 11/24/2017   ALT 12 12/02/2017   AST 12 12/02/2017   NA 142 12/02/2017   K 4.8 12/02/2017   CL 108 12/02/2017   CREATININE 0.98 12/02/2017   BUN 13 12/02/2017   CO2 28 12/02/2017   TSH 2.80 11/24/2017    Lab Results  Component Value Date   TSH 2.80 11/24/2017   Lab Results  Component Value Date   WBC 11.2 (H) 12/02/2017   HGB 12.3 12/02/2017   HCT 37.3 12/02/2017   MCV 92.6 12/02/2017   PLT 375.0 12/02/2017   Lab Results  Component Value Date   NA 142 12/02/2017   K 4.8 12/02/2017   CO2 28 12/02/2017   GLUCOSE 131 (H) 12/02/2017   BUN 13 12/02/2017   CREATININE 0.98 12/02/2017   BILITOT 0.4 12/02/2017   ALKPHOS 42 12/02/2017   AST 12 12/02/2017   ALT 12 12/02/2017   PROT 7.1 12/02/2017   ALBUMIN 4.1 12/02/2017   CALCIUM 9.7 12/02/2017   GFR 62.06 12/02/2017   Lab Results  Component Value Date   CHOL 175 11/24/2017   Lab Results  Component Value Date   HDL 44.70  11/24/2017   Lab Results  Component Value Date   LDLCALC 105 (H) 11/24/2017   Lab Results  Component Value Date   TRIG 129.0 11/24/2017   Lab Results  Component Value Date   CHOLHDL 4 11/24/2017   No results found for: HGBA1C       Assessment & Plan:   Problem List Items Addressed This Visit    None    Visit Diagnoses    Left lower quadrant pain    -  Primary   Relevant Medications   amoxicillin-clavulanate (AUGMENTIN) 875-125 MG tablet   Other Relevant Orders   CBC with Differential/Platelet (Completed)   Comprehensive metabolic panel (Completed)   POCT Urinalysis Dipstick (Automated) (Completed)   CT Abdomen Pelvis W Contrast   Urine Culture   Hematuria, unspecified type       Relevant Orders   Urine Culture      I am having Sherolyn Buba. Klang maintain her fluticasone, simvastatin, traZODone, loratadine, Fenofibrate Micronized, naproxen, and amoxicillin-clavulanate.  Meds ordered this encounter  Medications  . DISCONTD: amoxicillin-clavulanate (AUGMENTIN) 875-125 MG tablet    Sig: Take 1 tablet by mouth 2 (two) times daily.    Dispense:  20 tablet    Refill:  0  . amoxicillin-clavulanate (AUGMENTIN) 875-125 MG tablet    Sig: Take 1 tablet by mouth 2 (two) times daily.    Dispense:  20 tablet    Refill:  0  suspect diverticulitis--- ct abd    CMA served as scribe during this visit. History, Physical and Plan performed by medical provider. Documentation and orders reviewed and attested to.  Rosalita Chessman  Chase, DO

## 2017-12-03 ENCOUNTER — Encounter: Payer: Self-pay | Admitting: Family Medicine

## 2017-12-03 LAB — URINE CULTURE
MICRO NUMBER:: 90728005
SPECIMEN QUALITY:: ADEQUATE

## 2017-12-03 NOTE — Telephone Encounter (Signed)
Please give her info about BRAT diet

## 2018-01-28 ENCOUNTER — Telehealth: Payer: Self-pay | Admitting: Obstetrics & Gynecology

## 2018-01-28 ENCOUNTER — Encounter: Payer: Self-pay | Admitting: Obstetrics & Gynecology

## 2018-01-28 NOTE — Telephone Encounter (Signed)
Spoke with patient. Requesting medication options for hot flashes. Spoke with her pcp, was advised to f/u with Dr. Sabra Heck. Takes trazodone for insomnia, felt like xanax worked better. Patient is aware xanax can be habit forming, discussed with pcp. Denies any other GYN symptoms.   Recommended OV for further discussion with Dr. Sabra Heck. OV scheduled for 02/23/18 at 3:30pm.   Routing to provider for final review. Patient is agreeable to disposition. Will close encounter.

## 2018-01-28 NOTE — Telephone Encounter (Signed)
Patient returning call to Jill. °

## 2018-01-28 NOTE — Telephone Encounter (Signed)
Left message to call Trevaris Pennella at [phone].  

## 2018-01-28 NOTE — Telephone Encounter (Signed)
Patient sent the following correspondence through [facility]. Routing to triage to assist patient with request.  Hi Dr. Sabra Heck,    I wanted to ask you if you could recommend medicine that would help with hot flashes. This is what I am taking now, Antara & Simvastin for cholesterol, naproxen for back pain, and trazodone for sleep (still would prefer the 1 pill of xanax at night best).    Thank you! Marlowe Kays

## 2018-02-06 ENCOUNTER — Other Ambulatory Visit: Payer: Self-pay | Admitting: Obstetrics & Gynecology

## 2018-02-06 DIAGNOSIS — Z1231 Encounter for screening mammogram for malignant neoplasm of breast: Secondary | ICD-10-CM

## 2018-02-22 ENCOUNTER — Other Ambulatory Visit: Payer: Self-pay | Admitting: Family Medicine

## 2018-02-23 ENCOUNTER — Encounter: Payer: Self-pay | Admitting: Obstetrics & Gynecology

## 2018-02-23 ENCOUNTER — Other Ambulatory Visit: Payer: Self-pay

## 2018-02-23 ENCOUNTER — Ambulatory Visit (INDEPENDENT_AMBULATORY_CARE_PROVIDER_SITE_OTHER): Payer: BC Managed Care – PPO | Admitting: Obstetrics & Gynecology

## 2018-02-23 VITALS — BP 118/70 | HR 72 | Resp 16 | Ht 61.0 in | Wt 170.4 lb

## 2018-02-23 DIAGNOSIS — R232 Flushing: Secondary | ICD-10-CM | POA: Diagnosis not present

## 2018-02-23 MED ORDER — PAROXETINE HCL 10 MG PO TABS: 10.0000 mg | ORAL_TABLET | Freq: Every day | ORAL | 1 refills | Status: DC

## 2018-02-23 NOTE — Patient Instructions (Signed)
Cut the Paxil in 1/2 (5mg ) for 6 days.  So, take 1/2 tablet daily for 6 days and then increase to the full tablet daily.

## 2018-02-23 NOTE — Progress Notes (Signed)
GYNECOLOGY  VISIT  CC:   Hot flashes  HPI: 57 y.o. G2P2 Married Caucasian female here for hot flashes.  Reports these are still very bothersome to her and are often accompanied by sweats.  She is having some sleeping issues as these wake her up at night.  Does not have much complaint of vaginal dryness.  Was on HRT a few years ago but had a lot of irregular bleeding.  She was perimenopausal at the time.  She was switch to OCPs which regulated her cycle and then when she was fully menopausal, she went on HRT.  She took this for two months and then stopped.  This was >2 years ago.  Feels she also tried gabapentin but I cannot find this anywhere in her records.  If this is what she took, she did not like the way it made her feel, so she stopped it.    Reports she with Dr. Etter Sjogren the Xanax that she's been taking at night for years.  She was concerned that she was addicted to it.  Is now on trazodone but this doesn't seem to be helping much.  Retired last year.  Having some issues with mood and adjusting to changes in life.  Would like to consider options for this.  reviewed treatment for hot flashes.  She does not really want to be on estrogen due to side effects/risks.  Feels she has been on gabapentin in the past so declines this.  SSRI's discussed.  Due to mood issues, feels this may be a good options for her.  Titration discussed as well as risks/side effects.      GYNECOLOGIC HISTORY: Patient's last menstrual period was 09/29/2013. Contraception: post menopausal  Menopausal hormone therapy: none   Patient Active Problem List   Diagnosis Date Noted  . Insomnia 07/29/2017  . Mid back pain 08/02/2014  . Allergic rhinitis 08/02/2014  . Sinusitis 07/16/2011  . HEARING LOSS 06/25/2007  . SNORING 06/25/2007  . ANEMIA, IRON DEFICIENCY, HX OF 06/25/2007  . Hyperlipidemia LDL goal <100 06/24/2007  . Generalized anxiety disorder 06/24/2007  . MITRAL VALVE PROLAPSE 06/24/2007  . LOW BACK PAIN  SYNDROME 06/24/2007    Past Medical History:  Diagnosis Date  . Allergic rhinitis   . Anemia, iron deficiency   . Anxiety   . Hearing loss   . Hyperlipidemia   . Low back pain syndrome   . Mitral valve prolapse   . Sinus headache   . Snoring     Past Surgical History:  Procedure Laterality Date  . CESAREAN SECTION  11/90, 12/95  . [place] SURGERY  2004  . TUBAL LIGATION  05/30/94    MEDS:   Current Outpatient Medications on File Prior to Visit  Medication Sig Dispense Refill  . Fenofibrate Micronized (ANTARA) 90 MG CAPS Take 1 capsule by mouth daily. 90 capsule 1  . loratadine (CLARITIN) 10 MG tablet Take 1 tablet (10 mg total) by mouth daily. 90 tablet 3  . naproxen (NAPROSYN) 500 MG tablet Take 1 tablet (500 mg total) by mouth 2 (two) times daily with a meal. 60 tablet 5  . simvastatin (ZOCOR) 40 MG tablet Take 1 tablet (40 mg total) by mouth at bedtime. 90 tablet 1  . traZODone (DESYREL) 50 MG tablet Take 0.5-1 tablets (25-50 mg total) by mouth at bedtime as needed for sleep. 30 tablet 3   No current facility-administered medications on file prior to visit.     ALLERGIES: Doxycycline; Codeine; Hydrocodone-acetaminophen;  and Hydrocodone-homatropine  Family History  Problem Relation Age of Onset  . Cancer Father        Lung   . Cancer Mother        Lung Cancer  . Cancer Brother        throat cancer-heavy smoker  . COPD Sister     SH:  Married, non smoker  Review of Systems  Gastrointestinal:       Bloating   Genitourinary:       Loss of urine spontaneously   All other systems reviewed and are negative.   PHYSICAL EXAMINATION:    BP 118/70 (BP Location: Right Arm, Patient Position: Sitting, Cuff Size: Normal)   Pulse 72   Resp 16   Ht 5\' 1"  (1.549 m)   Wt 170 lb 6.4 oz (77.3 kg)   LMP 09/29/2013   BMI 32.20 kg/m     General appearance: alert, cooperative and appears stated age No physical exam performed today  Assessment: Hot  flashes Depressed mood  Plan: Will start Paxil 10mg .  She will take 1/2 tab daily for 6 days and then increased dose. Pt will follow up in one month.   ~15 minutes spent with patient >50% of time was in face to face discussion of above.

## 2018-03-13 ENCOUNTER — Ambulatory Visit
Admission: RE | Admit: 2018-03-13 | Discharge: 2018-03-13 | Disposition: A | Discharge status: Home / Self Care | Payer: BC Managed Care – PPO | Source: Ambulatory Visit | Attending: Obstetrics & Gynecology | Admitting: Obstetrics & Gynecology

## 2018-03-13 DIAGNOSIS — Z1231 Encounter for screening mammogram for malignant neoplasm of breast: Secondary | ICD-10-CM

## 2018-03-29 ENCOUNTER — Other Ambulatory Visit: Payer: Self-pay | Admitting: Family Medicine

## 2018-03-29 DIAGNOSIS — G4700 Insomnia, unspecified: Secondary | ICD-10-CM

## 2018-03-30 NOTE — Telephone Encounter (Signed)
Last office visit on 12/02/2017 Last refill on 11/24/2017  #30 with 3 refills

## 2018-04-23 ENCOUNTER — Other Ambulatory Visit: Payer: Self-pay | Admitting: Obstetrics & Gynecology

## 2018-04-24 NOTE — Telephone Encounter (Signed)
Medication refill request: Paxil  Last AEX:  04/25/17 Next AEX: 07/10/18 Last MMG (if hormonal medication request): 03/13/18 Bi-rads 1 Neg  Refill authorized: #30 with 1Rf to get her to her AEX

## 2018-05-19 ENCOUNTER — Other Ambulatory Visit: Payer: Self-pay | Admitting: Family Medicine

## 2018-07-10 ENCOUNTER — Encounter: Payer: Self-pay | Admitting: Obstetrics & Gynecology

## 2018-07-10 ENCOUNTER — Ambulatory Visit (INDEPENDENT_AMBULATORY_CARE_PROVIDER_SITE_OTHER): Payer: BC Managed Care – PPO | Admitting: Obstetrics & Gynecology

## 2018-07-10 VITALS — BP 120/70 | HR 68 | Resp 14 | Ht 60.75 in | Wt 169.0 lb

## 2018-07-10 DIAGNOSIS — N62 Hypertrophy of breast: Secondary | ICD-10-CM | POA: Diagnosis not present

## 2018-07-10 DIAGNOSIS — M549 Dorsalgia, unspecified: Secondary | ICD-10-CM | POA: Diagnosis not present

## 2018-07-10 DIAGNOSIS — Z01419 Encounter for gynecological examination (general) (routine) without abnormal findings: Secondary | ICD-10-CM | POA: Diagnosis not present

## 2018-07-10 DIAGNOSIS — T7840XS Allergy, unspecified, sequela: Secondary | ICD-10-CM

## 2018-07-10 NOTE — Progress Notes (Signed)
58 y.o. G2P2 Married White or Caucasian female here for annual exam.  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  Denies vaginal bleeding.    Didn't get a flu shot.    Reports she's having a lot more issues with allergies this year including swelling around her eyes.  She is taking Loratadine. Saw an allergist "years ago" and she did have allergy testing then.  She knows she has a dust allergy.  Would like to see an allergist.    Lastly, reports she's having some increased issues with back pain.  She's thinks this is related to her breasts.  She wears a 43C but this bra is definitely too small in cups size for her.  She appears to be more like a D.  Pain is in between her shoulder blades and is a chronic aching sensation.  Naprosyn helps.  Heat helps as well.  Would like to see Ortho for PT recommendations.  Has considered breast reduction as well.  Would like referral for this.   Patient's last menstrual period was 09/29/2013.          Sexually active: Yes.    The current method of family planning is tubal ligation.    Exercising: Yes.    P[place]et states that she walks and has a busy job .  Smoker:  no  Health Maintenance: Pap:  04/25/17 Neg. HR HPV:neg   12/16/14 Neg  History of abnormal Pap:  no MMG:  03/13/18 BIRADS1:Neg  Colonoscopy:  06/07/16 Normal. F/u 10 years  BMD:   never TDaP:  2015 Pneumonia vaccine(s):  no Shingrix:   no Hep C testing: 03/21/16 neg  Screening Labs: PCP   reports that she has never smoked. She has never used smokeless tobacco. She reports current alcohol use of about 1.0 standard drinks of alcohol per week. She reports that she does not use drugs.  Past Medical History:  Diagnosis Date  . Allergic rhinitis   . Anemia, iron deficiency   . Anxiety   . Hearing loss   . Hyperlipidemia   . Low back pain syndrome   . Mitral valve prolapse   . Sinus headache   . Snoring     Past Surgical  History:  Procedure Laterality Date  . CESAREAN SECTION  11/90, 12/95  . [place] SURGERY  2004  . TUBAL LIGATION  05/30/94    Current Outpatient Medications  Medication Sig Dispense Refill  . Fenofibrate Micronized (ANTARA) 90 MG CAPS Take 1 capsule by mouth daily. 90 capsule 1  . loratadine (CLARITIN) 10 MG tablet Take 1 tablet (10 mg total) by mouth daily. 90 tablet 3  . naproxen (NAPROSYN) 500 MG tablet Take 1 tablet (500 mg total) by mouth 2 (two) times daily with a meal. 60 tablet 5  . PARoxetine (PAXIL) 10 MG tablet TAKE 1 TABLET BY MOUTH ONCE DAILY 30 tablet 2  . simvastatin (ZOCOR) 40 MG tablet TAKE 1 TABLET BY MOUTH AT BEDTIME 90 tablet 1  . traZODone (DESYREL) 50 MG tablet TAKE 1/2 TO 1 (ONE-HALF TO ONE) TABLET BY MOUTH AT BEDTIME AS NEEDED FOR SLEEP 30 tablet 3   No current facility-administered medications for this visit.     Family History  Problem Relation Age of Onset  . Cancer Father        Lung   . Cancer Mother        Lung Cancer  .  Cancer Brother        throat cancer-heavy smoker  . COPD Sister     Review of Systems  Constitutional:       Weight gain   HENT: Positive for sinus pain.   All other systems reviewed and are negative.   Exam:   BP 120/70   Pulse 68   Resp 14   Ht 5' 0.75" (1.543 m)   Wt 169 lb (76.7 kg)   LMP 09/29/2013   BMI 32.20 kg/m    Height: 5' 0.75" (154.3 cm)  Ht Readings from Last 3 Encounters:  07/10/18 5' 0.75" (1.543 m)  02/23/18 5\' 1"  (1.549 m)  12/02/17 5\' 1"  (1.549 m)    General appearance: alert, cooperative and appears stated age Head: Normocephalic, without obvious abnormality, atraumatic Neck: no adenopathy, supple, symmetrical, trachea midline and thyroid normal to inspection and palpation Lungs: clear to auscultation bilaterally Breasts: normal appearance, no masses or tenderness Heart: regular rate and rhythm Abdomen: soft, non-tender; bowel sounds normal; no masses,  no organomegaly Extremities:  extremities normal, atraumatic, no cyanosis or edema Skin: Skin color, texture, turgor normal. No rashes or lesions Lymph nodes: Cervical, supraclavicular, and axillary nodes normal. No abnormal inguinal nodes palpated Neurologic: Grossly normal   Pelvic: External genitalia:  no lesions              Urethra:  normal appearing urethra with no masses, tenderness or lesions              Bartholins and Skenes: normal                 Vagina: normal appearing vagina with normal color and discharge, no lesions              Cervix: no lesions              Pap taken: No. Bimanual Exam:  Uterus:  normal size, contour, position, consistency, mobility, non-tender              Adnexa: normal adnexa and no mass, fullness, tenderness               Rectovaginal: Confirms               Anus:  normal sphincter tone, no lesions  Chaperone was present for exam.  A:  Well Woman with normal exam PMP, no HRT H/O more recent upper back pain with remote hx of lower back DDD Desires to consider breast reduction Elevated lipids Increased allergies, desires referral  P:   Mammogram guidelines reviewed Colonoscopy UTD pap smear with neg HR HPV 2018.  Not indicated today Referral to Dr. Starling Manns placed per pt request Plastic surgeon and ortho referral placed Information about Shingrix vaccination given Blood work planned with Dr. Etter Sjogren this summer. return annually or prn

## 2018-07-10 NOTE — Patient Instructions (Signed)
Outpatient Pharmacy at [facility] [address] [place],  [place]  [address]  Main: [phone] 

## 2018-07-14 ENCOUNTER — Encounter: Payer: Self-pay | Admitting: Obstetrics & Gynecology

## 2018-07-14 ENCOUNTER — Telehealth: Payer: Self-pay | Admitting: Obstetrics & Gynecology

## 2018-07-14 MED FILL — SHINGRIX 50 MCG SUS: 50 | 30 days supply | Qty: 1 | Fill #0

## 2018-07-14 NOTE — Telephone Encounter (Signed)
Responded to patient via mychart with contact information/ routing message to [facility] for referral update.

## 2018-07-14 NOTE — Telephone Encounter (Signed)
Patient sent the following message through [facility]. Routing to triage to assist patient with request.   Hey Dr. Sabra Heck, can you send me phone number of [place] [place] location at [facility] long? Also I have not heard from dr. Harlow Mares office yet. Thank you so much for everything!!!!

## 2018-07-20 ENCOUNTER — Other Ambulatory Visit: Payer: Self-pay | Admitting: Obstetrics & Gynecology

## 2018-07-20 NOTE — Telephone Encounter (Signed)
Medication refill request: Paxil  Last AEX:  07-10-2018 SM  Next AEX: 11-16-19 Last MMG (if hormonal medication request): 03-13-18 BIRADS 1 negative  Refill authorized: 04-24-18 #30, 2RF. Today, please advise.   Medication pended for #90, 0RF. Please advise.

## 2018-07-29 ENCOUNTER — Telehealth: Payer: Self-pay | Admitting: Obstetrics & Gynecology

## 2018-07-29 NOTE — Telephone Encounter (Signed)
From: Lucienne Minks Sent: 07/29/2018  12:04 PM EST To: Megan Salon, MD Subject: Orthopedic Surgery- Referral                   Dr. Sabra Heck,  Per Marla-at Dr. Kathrin Penner office  patient is going to put this off for right now.She will call back later fo scheduling. Ok to defer the reerral Per Dr. Dr. Sabra Heck- Yes or just complete the referral and I can reenter it when needed in the future  [facility]

## 2018-08-07 ENCOUNTER — Other Ambulatory Visit: Payer: Self-pay | Admitting: Family Medicine

## 2018-08-07 DIAGNOSIS — G4700 Insomnia, unspecified: Secondary | ICD-10-CM

## 2018-08-13 ENCOUNTER — Telehealth: Payer: Self-pay | Admitting: Obstetrics & Gynecology

## 2018-08-13 ENCOUNTER — Encounter: Payer: Self-pay | Admitting: Obstetrics & Gynecology

## 2018-08-13 ENCOUNTER — Encounter: Payer: Self-pay | Admitting: Family Medicine

## 2018-08-13 NOTE — Telephone Encounter (Signed)
Patient sent the following message through [facility]. Routing to triage to assist patient with request.   Good morning Dr. Sabra Heck, I had my appointment with Dr. Harlow Mares yesterday and he said I am a great candidate for a breast reduction. In talking to his staff about my insurance, to start out this process it would help if I obtain two letters of recommendations from two of my other doctors. Could you write one for me?    I really appreciate you doing this for me!

## 2018-08-17 ENCOUNTER — Encounter: Payer: Self-pay | Admitting: Allergy and Immunology

## 2018-08-17 ENCOUNTER — Encounter: Payer: Self-pay | Admitting: Obstetrics & Gynecology

## 2018-08-17 ENCOUNTER — Ambulatory Visit: Payer: BC Managed Care – PPO | Admitting: Allergy and Immunology

## 2018-08-17 VITALS — BP 126/82 | HR 63 | Temp 97.9°F | Resp 12 | Ht 61.0 in | Wt 171.8 lb

## 2018-08-17 DIAGNOSIS — J4520 Mild intermittent asthma, uncomplicated: Secondary | ICD-10-CM | POA: Diagnosis not present

## 2018-08-17 DIAGNOSIS — H1013 Acute atopic conjunctivitis, bilateral: Secondary | ICD-10-CM | POA: Diagnosis not present

## 2018-08-17 DIAGNOSIS — J3089 Other allergic rhinitis: Secondary | ICD-10-CM

## 2018-08-17 DIAGNOSIS — J0100 Acute maxillary sinusitis, unspecified: Secondary | ICD-10-CM | POA: Diagnosis not present

## 2018-08-17 DIAGNOSIS — H1010 Acute atopic conjunctivitis, unspecified eye: Secondary | ICD-10-CM | POA: Insufficient documentation

## 2018-08-17 MED ORDER — CARBINOXAMINE MALEATE 4 MG/5ML PO SOLN: 4.0000 mg | Freq: Four times a day (QID) | ORAL | 1 refills | Status: DC | PRN

## 2018-08-17 MED ORDER — OLOPATADINE HCL 0.7 % OP SOLN: 1.0000 [drp] | Freq: Every day | OPHTHALMIC | 5 refills | Status: DC

## 2018-08-17 MED ORDER — FLUTICASONE PROPIONATE 93 MCG/ACT NA EXHU: 2.0000 | INHALANT_SUSPENSION | Freq: Two times a day (BID) | NASAL | 3 refills | Status: DC

## 2018-08-17 MED ORDER — ALBUTEROL SULFATE HFA 108 (90 BASE) MCG/ACT IN AERS: 2.0000 | INHALATION_SPRAY | Freq: Four times a day (QID) | RESPIRATORY_TRACT | 2 refills | Status: DC | PRN

## 2018-08-17 NOTE — Patient Instructions (Addendum)
Allergic rhinitis with a probable nonallergic component  Aeroallergen avoidance measures have been discussed and provided in written form.  A prescription has been provided for carbinoxamine 4 mg every 6-8 hours if needed.  A prescription has been provided for [facility], 2 actuations per nostril twice daily as needed.  Nasal saline spray (i.e., Simply Saline) or nasal saline lavage (i.e., NeilMed) is recommended as needed and prior to medicated nasal sprays.  For thick post nasal drainage, add guaifenesin 1200 mg (Mucinex Maximum Strength)  twice daily as needed with adequate hydration as discussed.  Allergic conjunctivitis  Treatment plan as outlined above for allergic rhinitis.  A prescription has been provided for Pazeo, one drop per eye daily as needed.  I have also recommended eye lubricant drops (i.e., Natural Tears) as needed.  Acute sinusitis  Prednisone has been provided, 40 mg x3 days, 20 mg x1 day, 10 mg x1 day, then stop.  Treatment plan as outlined above for allergic rhinitis (as above).  Mild intermittent asthma The patient's history suggests mild intermittent asthma.  Spirometry today reveals 100 mL postbronchodilator reversibility while asymptomatic.  A prescription has been provided for albuterol HFA, 1 to 2 inhalations every 4-6 hours if needed.  Subjective and objective measures of pulmonary function will be followed and the treatment plan will be adjusted accordingly.   Return in about 4 months (around 12/17/2018), or if symptoms worsen or fail to improve.  Reducing Pollen Exposure  The American Academy of Allergy, Asthma and Immunology suggests the following steps to reduce your exposure to pollen during allergy seasons.    1. Do not hang sheets or clothing out to dry; pollen may collect on these items. 2. Do not mow lawns or spend time around freshly cut grass; mowing stirs up pollen. 3. Keep windows closed at night.  Keep car windows closed while  driving. 4. Minimize morning activities outdoors, a time when pollen counts are usually at their highest. 5. Stay indoors as much as possible when pollen counts or humidity is high and on windy days when pollen tends to remain in the air longer. 6. Use air conditioning when possible.  Many air conditioners have filters that trap the pollen spores. 7. Use a HEPA room air filter to remove pollen form the indoor air you breathe.   Control of Mold Allergen  Mold and fungi can grow on a variety of surfaces provided certain temperature and moisture conditions exist.  Outdoor molds grow on plants, decaying vegetation and soil.  The major outdoor mold, Alternaria and Cladosporium, are found in very high numbers during hot and dry conditions.  Generally, a late Summer - Fall peak is seen for common outdoor fungal spores.  Rain will temporarily lower outdoor mold spore count, but counts rise rapidly when the rainy period ends.  The most important indoor molds are Aspergillus and Penicillium.  Dark, humid and poorly ventilated basements are ideal sites for mold growth.  The next most common sites of mold growth are the bathroom and the kitchen.  Outdoor [employer] 1. Use air conditioning and keep windows closed 2. Avoid exposure to decaying vegetation. 3. Avoid leaf raking. 4. Avoid grain handling. 5. Consider wearing a face mask if working in moldy areas.  Indoor Mold Control 1. Maintain humidity below 50%. 2. Clean washable surfaces with 5% bleach solution. 3. Remove sources e.g. Contaminated carpets.

## 2018-08-17 NOTE — Telephone Encounter (Signed)
Please let pt know I will be happy to write her a letter.  I do need: 1) her exact bra size 2) any neck, back, shoulder or skin issues she's experienced due to enlarged breasts (include grooves in skin from bra or yeast on skin under breasts, for example) 3) any treatments she's tried (heat, anti-inflammatories, physical therapy, for example).  With this information, I will write the letter and then send to her and Dr. Harlow Mares?  Please confirm.

## 2018-08-17 NOTE — Progress Notes (Signed)
New Patient Note  RE: Brianna Carroll MRN: [id number] DOB: Jun 28, 1960 Date of Office Visit: 08/17/2018  Referring provider: Megan Salon, MD Primary care provider: Carollee Herter, Alferd Apa, DO  Chief Complaint: Nasal Congestion; Sinus Problem; and Breathing Problem   History of present illness: Brianna Carroll is a 58 y.o. female seen today in consultation requested by Hale Bogus, MD.  She reports that for the past few decades she has experienced seasonal allergic rhinoconjunctivitis in the springtime and in the fall, with most severe symptoms during the fall.  However, since October 2019 she has experienced more persistent nasal congestion, thick postnasal drainage, infraorbital edema, lacrimation, and sinus pressure over the cheekbones.  She reports that her allergy symptoms "always clear up at the beach."  Attempts to control the symptoms with loratadine have failed.  She has tried Flonase in the past as well.  She reports that when her allergy symptoms are particularly active she also experiences some chest tightness and "fighting breath."  She denies overt wheezing.  Assessment and plan: Allergic rhinitis with a probable nonallergic component  Aeroallergen avoidance measures have been discussed and provided in written form.  A prescription has been provided for carbinoxamine 4 mg every 6-8 hours if needed.  A prescription has been provided for [facility], 2 actuations per nostril twice daily as needed.  Nasal saline spray (i.e., Simply Saline) or nasal saline lavage (i.e., NeilMed) is recommended as needed and prior to medicated nasal sprays.  For thick post nasal drainage, add guaifenesin 1200 mg (Mucinex Maximum Strength)  twice daily as needed with adequate hydration as discussed.  Allergic conjunctivitis  Treatment plan as outlined above for allergic rhinitis.  A prescription has been provided for Pazeo, one drop per eye daily as needed.  I have also recommended eye lubricant  drops (i.e., Natural Tears) as needed.  Acute sinusitis  Prednisone has been provided, 40 mg x3 days, 20 mg x1 day, 10 mg x1 day, then stop.  Treatment plan as outlined above for allergic rhinitis (as above).  Mild intermittent asthma The patient's history suggests mild intermittent asthma.  Spirometry today reveals 100 mL postbronchodilator reversibility while asymptomatic.  A prescription has been provided for albuterol HFA, 1 to 2 inhalations every 4-6 hours if needed.  Subjective and objective measures of pulmonary function will be followed and the treatment plan will be adjusted accordingly.   Meds ordered this encounter  Medications  . Carbinoxamine Maleate 4 MG/5ML SOLN    Sig: Take 5 mLs (4 mg total) by mouth 4 (four) times daily as needed.    Dispense:  473 mL    Refill:  1  . Olopatadine HCl (PAZEO) 0.7 % SOLN    Sig: Place 1 drop into both eyes daily.    Dispense:  1 Bottle    Refill:  5  . albuterol (PROVENTIL HFA;VENTOLIN HFA) 108 (90 Base) MCG/ACT inhaler    Sig: Inhale 2 puffs into the lungs every 6 (six) hours as needed for wheezing or shortness of breath.    Dispense:  1 Inhaler    Refill:  2  . Fluticasone Propionate (XHANCE) 93 MCG/ACT EXHU    Sig: Place 2 sprays into the nose 2 (two) times daily.    Dispense:  16 mL    Refill:  3    [phone] (M)    Diagnostics: Spirometry: FVC was 2.36 L and FEV1 was 1.84 L (79% predicted) with 100 mL postbronchodilator improvement.  This study was performed while  the patient was asymptomatic.  Please see scanned spirometry results for details. Epicutaneous testing: Positive to tree pollen. Intradermal testing: Positive to ragweed pollen, borderline positive to weed pollen, and borderline positive to major mold mix #2.   Physical examination: Blood pressure 126/82, pulse 63, temperature 97.9 F (36.6 C), temperature source Oral, resp. rate 12, height 5\' 1"  (1.549 m), weight 171 lb 12.8 oz (77.9 kg), last menstrual  period 09/29/2013, SpO2 98 %.  General: Alert, interactive, in no acute distress. HEENT: TMs pearly gray, turbinates edematous with thick discharge, post-pharynx moderately erythematous. Neck: Supple without lymphadenopathy. Lungs: Clear to auscultation without wheezing, rhonchi or rales. CV: Normal S1, S2 without murmurs. Abdomen: Nondistended, nontender. Skin: Warm and dry, without lesions or rashes. Extremities:  No clubbing, cyanosis or edema. Neuro:   Grossly intact.  Review of systems:  Review of systems negative except as noted in HPI / PMHx or noted below: Review of Systems  Constitutional: Negative.   HENT: Negative.   Eyes: Negative.   Respiratory: Negative.   Cardiovascular: Negative.   Gastrointestinal: Negative.   Genitourinary: Negative.   Musculoskeletal: Negative.   Skin: Negative.   Neurological: Negative.   Endo/Heme/Allergies: Negative.   Psychiatric/Behavioral: Negative.     Past medical history:  Past Medical History:  Diagnosis Date  . Allergic rhinitis   . Anemia, iron deficiency   . Anxiety   . Hearing loss   . Hyperlipidemia   . Low back pain syndrome   . Mitral valve prolapse   . Sinus headache   . Snoring     Past surgical history:  Past Surgical History:  Procedure Laterality Date  . CESAREAN SECTION  11/90, 12/95  . [place] SURGERY  2004  . TUBAL LIGATION  05/30/94    Family history: Family History  Problem Relation Age of Onset  . Cancer Father        Lung   . Cancer Mother        Lung Cancer  . Cancer Brother        throat cancer-heavy smoker  . COPD Sister   . Asthma Son   . Allergic rhinitis Son   . Allergic rhinitis Son     Social history: Social History   Socioeconomic History  . Marital status: Married    Spouse name: [name]  . Number of children: 2  . Years of education: Not on file  . Highest education level: Not on file  Occupational History  . 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 Financial resource strain: Not on file  . Food insecurity:    Worry: Not on file    Inability: Not on file  . Transportation needs:    Medical: Not on file    Non-medical: Not on file  Tobacco Use  . Smoking status: Never Smoker  . Smokeless tobacco: Never Used  Substance and Sexual Activity  . Alcohol use: Yes    Alcohol/week: 1.0 standard drinks    Types: 1 Standard drinks or equivalent per week  . Drug use: No  . Sexual activity: Yes    Partners: Male    Birth control/protection: Surgical    Comment: BTL  Lifestyle  . Physical activity:    Days per week: Not on file    Minutes per session: Not on file  . Stress: Not on file  Relationships  . Social connections:    Talks on phone:  Not on file    Gets together: Not on file    Attends religious service: Not on file    Active member of club or organization: Not on file    Attends meetings of clubs or organizations: Not on file    Relationship status: Not on file  . Intimate partner violence:    Fear of current or ex partner: Not on file    Emotionally abused: Not on file    Physically abused: Not on file    Forced sexual activity: Not on file  Other Topics Concern  . Not on file  Social History Narrative  . Not on file   Environmental History: The patient lives in a 11-year-old house with hardwood floors throughout and central air/heat.  There are 2 dogs in the home which have access to her bedroom.  There is no known mold/water damage in the home.  She is a non-smoker.   Allergies as of 08/17/2018      Reactions   Doxycycline Rash   REACTION: hives   Codeine    REACTION: nausea and vomiting   Hydrocodone-acetaminophen    REACTION: vomiting   Hydrocodone-homatropine    REACTION: nausea and vomiting      Medication List       Accurate as of August 17, 2018 12:17 PM. Always use your most recent med list.        albuterol 108 (90 Base) MCG/ACT inhaler Commonly  known as:  PROVENTIL HFA;VENTOLIN HFA Inhale 2 puffs into the lungs every 6 (six) hours as needed for wheezing or shortness of breath.   Carbinoxamine Maleate 4 MG/5ML Soln Take 5 mLs (4 mg total) by mouth 4 (four) times daily as needed.   Fenofibrate Micronized 90 MG Caps Commonly known as:  ANTARA Take 1 capsule by mouth daily.   Fluticasone Propionate 93 MCG/ACT Exhu Commonly known as:  XHANCE Place 2 sprays into the nose 2 (two) times daily.   loratadine 10 MG tablet Commonly known as:  CLARITIN Take 1 tablet (10 mg total) by mouth daily.   naproxen 500 MG tablet Commonly known as:  NAPROSYN Take 1 tablet (500 mg total) by mouth 2 (two) times daily with a meal.   Olopatadine HCl 0.7 % Soln Commonly known as:  PAZEO Place 1 drop into both eyes daily.   PARoxetine 10 MG tablet Commonly known as:  PAXIL TAKE 1 TABLET BY MOUTH ONCE DAILY   simvastatin 40 MG tablet Commonly known as:  ZOCOR TAKE 1 TABLET BY MOUTH AT BEDTIME   traZODone 50 MG tablet Commonly known as:  DESYREL TAKE 1/2 TO 1  TABLET BY MOUTH AT BEDTIME AS NEEDED FOR SLEEP   VITAMIN D-1000 MAX ST 25 MCG (1000 UT) tablet Generic drug:  Cholecalciferol Take by mouth.       Known medication allergies: Allergies  Allergen Reactions  . Doxycycline Rash    REACTION: hives  . Codeine     REACTION: nausea and vomiting  . Hydrocodone-Acetaminophen     REACTION: vomiting  . Hydrocodone-Homatropine     REACTION: nausea and vomiting    I appreciate the opportunity to take part in Kashay's care. Please do not hesitate to contact me with questions.  Sincerely,   R. Edgar Frisk, MD

## 2018-08-17 NOTE — Assessment & Plan Note (Signed)
The patient's history suggests mild intermittent asthma.  Spirometry today reveals 100 mL postbronchodilator reversibility while asymptomatic.  A prescription has been provided for albuterol HFA, 1 to 2 inhalations every 4-6 hours if needed.  Subjective and objective measures of pulmonary function will be followed and the treatment plan will be adjusted accordingly.

## 2018-08-17 NOTE — Assessment & Plan Note (Signed)
   Aeroallergen avoidance measures have been discussed and provided in written form.  A prescription has been provided for carbinoxamine 4 mg every 6-8 hours if needed.  A prescription has been provided for [facility], 2 actuations per nostril twice daily as needed.  Nasal saline spray (i.e., Simply Saline) or nasal saline lavage (i.e., NeilMed) is recommended as needed and prior to medicated nasal sprays.  For thick post nasal drainage, add guaifenesin 1200 mg (Mucinex Maximum Strength)  twice daily as needed with adequate hydration as discussed.

## 2018-08-17 NOTE — Assessment & Plan Note (Signed)
   Treatment plan as outlined above for allergic rhinitis.  A prescription has been provided for Pazeo, one drop per eye daily as needed.  I have also recommended eye lubricant drops (i.e., Natural Tears) as needed. 

## 2018-08-17 NOTE — Assessment & Plan Note (Signed)
   Prednisone has been provided, 40 mg x3 days, 20 mg x1 day, 10 mg x1 day, then stop.  Treatment plan as outlined above for allergic rhinitis (as above).

## 2018-08-18 NOTE — Telephone Encounter (Signed)
Spoke with patient.   1. 38 DD  2. Grooves in skin on both shoulders and in upper back where bra sits. Mid back pain below shoulder blade. Neck pain in middle of neck.  Pain in both shoulder blades. Yeast under breast. Constant sweating under breast. Feels like she is falling forward when walking, always looking down.   3. PT and Ortho following back surgery in approximately 2003/2004 for bulging disk in lower back. States although she did PT and ortho post op, therapy was for her whole back. On naproxen 500 mg daily for upper back pain for the past 3-4 years. Has tried heat and icy hot topically for "tension relief". Has tried exercising and stretching. Lost 12lbs approximately 54yrs ago, noticed no changes.   Advised I will update Dr. Sabra Heck and return call if any additional recommendations.

## 2018-08-26 ENCOUNTER — Encounter: Payer: Self-pay | Admitting: Obstetrics & Gynecology

## 2018-08-26 NOTE — Telephone Encounter (Signed)
Pt sent mychart message that reduction was approved.  Ok to close encounter.

## 2018-08-26 NOTE — Telephone Encounter (Signed)
Encounter closed

## 2018-09-20 ENCOUNTER — Other Ambulatory Visit: Payer: Self-pay | Admitting: Family Medicine

## 2018-09-20 DIAGNOSIS — E785 Hyperlipidemia, unspecified: Secondary | ICD-10-CM

## 2018-09-28 ENCOUNTER — Other Ambulatory Visit: Payer: Self-pay

## 2018-09-28 ENCOUNTER — Encounter: Payer: Self-pay | Admitting: Family Medicine

## 2018-09-28 ENCOUNTER — Ambulatory Visit (INDEPENDENT_AMBULATORY_CARE_PROVIDER_SITE_OTHER): Payer: BC Managed Care – PPO | Admitting: Family Medicine

## 2018-09-28 DIAGNOSIS — E785 Hyperlipidemia, unspecified: Secondary | ICD-10-CM

## 2018-09-28 NOTE — Progress Notes (Signed)
Virtual Visit via Video Note  I connected with Brianna Carroll on 09/28/18 at  9:00 AM EDT by a video enabled telemedicine application and verified that I am speaking with the correct person using two identifiers.   I discussed the limitations of evaluation and management by telemedicine and the availability of in person appointments. The patient expressed understanding and agreed to proceed.  History of Present Illness: Pt is home needing refills.  No complaints.   Pt saw the allergist and her meds were changed a little.  Too soon to know if it helps or not.   No other complaints    Observations/Objective: A febrile, pt in NAd    rr normal  Weight  169  Ht 5 ft 1 in     122/76  Pulse 57  98%   Assessment and Plan: 1. Hyperlipidemia, unspecified hyperlipidemia type Encouraged heart healthy diet, increase exercise, avoid trans fats, consider a krill oil cap daily - Lipid panel; Future - Comprehensive metabolic panel; Future   Follow Up Instructions:    I discussed the assessment and treatment plan with the patient. The patient was provided an opportunity to ask questions and all were answered. The patient agreed with the plan and demonstrated an understanding of the instructions.   The patient was advised to call back or seek an in-person evaluation if the symptoms worsen or if the condition fails to improve as anticipated.     Ann Held, DO

## 2018-10-26 ENCOUNTER — Other Ambulatory Visit: Payer: Self-pay | Admitting: Obstetrics & Gynecology

## 2018-10-26 NOTE — Telephone Encounter (Signed)
Medication refill request: Paroxetine Last AEX:  07/10/18 SM Next AEX: 11/16/19 Last MMG (if hormonal medication request): 03/13/18 BIRADS 1 negative/density b Refill authorized: Order pended #90 w/0 refills if authorized

## 2018-10-27 ENCOUNTER — Other Ambulatory Visit: Payer: BC Managed Care – PPO

## 2018-11-02 ENCOUNTER — Other Ambulatory Visit: Payer: Self-pay

## 2018-11-02 ENCOUNTER — Other Ambulatory Visit (INDEPENDENT_AMBULATORY_CARE_PROVIDER_SITE_OTHER): Payer: BC Managed Care – PPO

## 2018-11-02 DIAGNOSIS — E785 Hyperlipidemia, unspecified: Secondary | ICD-10-CM

## 2018-11-02 LAB — LIPID PANEL
Cholesterol: 163 mg/dL (ref 0–200)
HDL: 47.8 mg/dL (ref >39.00)
LDL Cholesterol: 87 mg/dL (ref 0–99)
NonHDL: 115.17
Total CHOL/HDL Ratio: 3
Triglycerides: 142 mg/dL (ref 0.0–149.0)
VLDL: 28.4 mg/dL (ref 0.0–40.0)

## 2018-11-02 LAB — COMPREHENSIVE METABOLIC PANEL
ALT: 17 U/L (ref 0–35)
AST: 18 U/L (ref 0–37)
Albumin: 4.4 g/dL (ref 3.5–5.2)
Alkaline Phosphatase: 31 U/L — ABNORMAL LOW (ref 39–117)
BUN: 19 mg/dL (ref 6–23)
CO2: 30 mEq/L (ref 19–32)
Calcium: 9.4 mg/dL (ref 8.4–10.5)
Chloride: 105 mEq/L (ref 96–112)
Creatinine, Ser: 0.9 mg/dL (ref 0.40–1.20)
GFR: 64.21 mL/min (ref >60.00)
Glucose, Bld: 88 mg/dL (ref 70–99)
Potassium: 4.8 mEq/L (ref 3.5–5.1)
Sodium: 141 mEq/L (ref 135–145)
Total Bilirubin: 0.3 mg/dL (ref 0.2–1.2)
Total Protein: 6.6 g/dL (ref 6.0–8.3)

## 2018-11-27 ENCOUNTER — Ambulatory Visit: Payer: Self-pay | Admitting: Plastic Surgery

## 2018-12-04 ENCOUNTER — Other Ambulatory Visit: Payer: Self-pay | Admitting: Family Medicine

## 2018-12-04 DIAGNOSIS — G4700 Insomnia, unspecified: Secondary | ICD-10-CM

## 2018-12-07 ENCOUNTER — Other Ambulatory Visit: Payer: Self-pay

## 2018-12-07 ENCOUNTER — Encounter (HOSPITAL_COMMUNITY): Payer: Self-pay

## 2018-12-07 ENCOUNTER — Other Ambulatory Visit (HOSPITAL_COMMUNITY)
Admission: RE | Admit: 2018-12-07 | Discharge: 2018-12-07 | Disposition: A | Discharge status: Home / Self Care | Payer: BC Managed Care – PPO | Source: Ambulatory Visit | Attending: Plastic Surgery | Admitting: Plastic Surgery

## 2018-12-07 ENCOUNTER — Encounter (HOSPITAL_COMMUNITY)
Admission: RE | Admit: 2018-12-07 | Discharge: 2018-12-07 | Disposition: A | Discharge status: Home / Self Care | Payer: BC Managed Care – PPO | Source: Ambulatory Visit | Attending: Plastic Surgery | Admitting: Plastic Surgery

## 2018-12-07 DIAGNOSIS — Z1159 Encounter for screening for other viral diseases: Secondary | ICD-10-CM | POA: Diagnosis not present

## 2018-12-07 DIAGNOSIS — Z01818 Encounter for other preprocedural examination: Secondary | ICD-10-CM | POA: Diagnosis present

## 2018-12-07 HISTORY — DX: Sleep apnea, unspecified: G47.30

## 2018-12-07 HISTORY — DX: Cardiac murmur, unspecified: R01.1

## 2018-12-07 LAB — BASIC METABOLIC PANEL
Anion gap: 9 (ref 5–15)
BUN: 14 mg/dL (ref 6–20)
CO2: 25 mmol/L (ref 22–32)
Calcium: 9.3 mg/dL (ref 8.9–10.3)
Chloride: 106 mmol/L (ref 98–111)
Creatinine, Ser: 0.94 mg/dL (ref 0.44–1.00)
GFR calc Af Amer: >60 mL/min (ref >60)
GFR calc non Af Amer: >60 mL/min (ref >60)
Glucose, Bld: 96 mg/dL (ref 70–99)
Potassium: 3.8 mmol/L (ref 3.5–5.1)
Sodium: 140 mmol/L (ref 135–145)

## 2018-12-07 LAB — CBC
HCT: 41.5 % (ref 36.0–46.0)
Hemoglobin: 13.3 g/dL (ref 12.0–15.0)
MCH: 30.9 pg (ref 26.0–34.0)
MCHC: 32 g/dL (ref 30.0–36.0)
MCV: 96.3 fL (ref 80.0–100.0)
Platelets: 272 10*3/uL (ref 150–400)
RBC: 4.31 MIL/uL (ref 3.87–5.11)
RDW: 12 % (ref 11.5–15.5)
WBC: 5.4 10*3/uL (ref 4.0–10.5)
nRBC: 0 % (ref 0.0–0.2)

## 2018-12-07 LAB — SARS CORONAVIRUS 2 (TAT 6-24 HRS): SARS Coronavirus 2: NEGATIVE

## 2018-12-07 NOTE — Progress Notes (Addendum)
PCP - Roma Schanz, MD Cardiologist -  Chest x-ray - pt denies past year EKG - 12/07/2018  Stress Test - pt denies ECHO - 08/2017 in EPIC  Cardiac Cath - pt denies  Sleep Study - Yes CPAP - yes  Fasting Blood Sugar - n/a Checks Blood Sugar _____ times a day-n/a  Blood Thinner Instructions: n/a Aspirin Instructions: n/a  Anesthesia review: No  Patient denies shortness of breath, fever, cough and chest pain at PAT appointment  Patient verbalized understanding of instructions that were given to them at the PAT appointment. Patient was also instructed that they will need to review over the PAT instructions again at home before surgery.

## 2018-12-07 NOTE — Pre-Procedure Instructions (Signed)
SHERITA DECOSTE  12/07/2018      [place], [address] [place] [address] Phone: [phone] Fax: [phone]  [place], [address] [place] [address] Phone: [phone] Fax: [phone]  [place], [place] [address] [place] [address] Phone: [phone] Fax: [phone]    Your procedure is scheduled on December 10, 2018.  Report to [facility] Entrance "A" at 530 AM.  Call this number if you have problems the morning of surgery:  [phone]   Remember:  Do not eat or drink after midnight.    Take these medicines the morning of surgery with A SIP OF WATER  acetaminophen (TYLENOL)-if needed albuterol  inhaler-if needed Fluticasone Propionate (XHANCE) nasal spray-if needed loratadine (CLARITIN)   7 days prior to surgery STOP taking any Aspirin (unless otherwise instructed by your surgeon), Aleve, Naproxen, Ibuprofen, Motrin, Advil, Goody's, BC's, all herbal medications, fish oil, and all vitamins    Do not wear jewelry, make-up or nail polish.  Do not wear lotions, powders, or perfumes, or deodorant.  Do not shave 48 hours prior to surgery.   Do not bring valuables to the hospital.  [facility] is not responsible for any belongings or valuables.  Contacts, dentures or bridgework may not be worn into surgery.  Leave your suitcase in the car.  After surgery it may be brought to your room.  For patients admitted to the hospital, discharge time will be determined by your treatment team.  Patients discharged the day of surgery will not be allowed to drive home.    [place]- Preparing For Surgery  Before surgery, you can play an important role. Because skin is not sterile, your skin needs to be as free of germs as possible. You can reduce the number of germs on your skin by  washing with CHG (chlorahexidine gluconate) Soap before surgery.  CHG is an antiseptic cleaner which kills germs and bonds with the skin to continue killing germs even after washing.    Oral Hygiene is also important to reduce your risk of infection.  Remember - BRUSH YOUR TEETH THE MORNING OF SURGERY WITH YOUR REGULAR TOOTHPASTE  Please do not use if you have an allergy to CHG or antibacterial soaps. If your skin becomes reddened/irritated stop using the CHG.  Do not shave (including legs and underarms) for at least 48 hours prior to first CHG shower. It is OK to shave your face.  Please follow these instructions carefully.   1. Shower the NIGHT BEFORE SURGERY and the MORNING OF SURGERY with CHG.   2. If you chose to wash your hair, wash your hair first as usual with your normal shampoo.  3. After you shampoo, rinse your hair and body thoroughly to remove the shampoo.  4. Use CHG as you would any other liquid soap. You can apply CHG directly to the skin and wash gently with a scrungie or a clean washcloth.   5. Apply the CHG Soap to your body ONLY FROM THE NECK DOWN.  Do not use on open wounds or open sores. Avoid contact with your eyes, ears, mouth and genitals (private parts). Wash Face and genitals (private parts)  with your normal soap.  6. Wash thoroughly, paying special attention to the area where your surgery will be performed.  7.  Thoroughly rinse your body with warm water from the neck down.  8. DO NOT shower/wash with your normal soap after using and rinsing off the CHG Soap.  9. Pat yourself dry with a CLEAN TOWEL.  10. Wear CLEAN PAJAMAS to bed the night before surgery, wear comfortable clothes the morning of surgery  11. Place CLEAN SHEETS on your bed the night of your first shower and DO NOT SLEEP WITH PETS.   Day of Surgery:  Do not apply any deodorants/lotions.  Please wear clean clothes to the hospital/surgery center.   Remember to brush your teeth WITH YOUR  REGULAR TOOTHPASTE.   Please read over the following fact sheets that you were given.

## 2018-12-10 ENCOUNTER — Ambulatory Visit (HOSPITAL_COMMUNITY): Payer: BC Managed Care – PPO | Admitting: Anesthesiology

## 2018-12-10 ENCOUNTER — Encounter (HOSPITAL_COMMUNITY)
Admission: RE | Disposition: A | Discharge status: Home / Self Care | Payer: Self-pay | Source: Home / Self Care | Attending: Plastic Surgery

## 2018-12-10 ENCOUNTER — Encounter (HOSPITAL_COMMUNITY): Payer: Self-pay | Admitting: *Deleted

## 2018-12-10 ENCOUNTER — Ambulatory Visit (HOSPITAL_COMMUNITY)
Admission: RE | Admit: 2018-12-10 | Discharge: 2018-12-11 | Disposition: A | Discharge status: Home / Self Care | Payer: BC Managed Care – PPO | Attending: Plastic Surgery | Admitting: Plastic Surgery

## 2018-12-10 ENCOUNTER — Other Ambulatory Visit: Payer: Self-pay

## 2018-12-10 DIAGNOSIS — G4730 Sleep apnea, unspecified: Secondary | ICD-10-CM | POA: Diagnosis not present

## 2018-12-10 DIAGNOSIS — H9190 Unspecified hearing loss, unspecified ear: Secondary | ICD-10-CM | POA: Diagnosis not present

## 2018-12-10 DIAGNOSIS — M25519 Pain in unspecified shoulder: Secondary | ICD-10-CM | POA: Insufficient documentation

## 2018-12-10 DIAGNOSIS — N62 Hypertrophy of breast: Secondary | ICD-10-CM | POA: Diagnosis not present

## 2018-12-10 DIAGNOSIS — M542 Cervicalgia: Secondary | ICD-10-CM | POA: Insufficient documentation

## 2018-12-10 DIAGNOSIS — M546 Pain in thoracic spine: Secondary | ICD-10-CM | POA: Insufficient documentation

## 2018-12-10 HISTORY — PX: BREAST REDUCTION SURGERY: SHX8

## 2018-12-10 HISTORY — PX: REDUCTION MAMMAPLASTY: SUR839

## 2018-12-10 SURGERY — MAMMOPLASTY, REDUCTION
Anesthesia: General | Site: Breast | Laterality: Bilateral

## 2018-12-10 MED ORDER — FENTANYL CITRATE (PF) 100 MCG/2ML IJ SOLN
INTRAMUSCULAR | Status: DC | PRN
  Administered 2018-12-10 (×2): 50 ug via INTRAVENOUS
  Administered 2018-12-10: 100 ug via INTRAVENOUS
  Administered 2018-12-10: 50 ug via INTRAVENOUS

## 2018-12-10 MED ORDER — FENOFIBRATE MICRONIZED 90 MG PO CAPS: 1.0000 | ORAL_CAPSULE | Freq: Every day | ORAL | Status: DC

## 2018-12-10 MED ORDER — EPHEDRINE SULFATE-NACL 50-0.9 MG/10ML-% IV SOSY
PREFILLED_SYRINGE | INTRAVENOUS | Status: DC | PRN
  Administered 2018-12-10: 5 mg via INTRAVENOUS

## 2018-12-10 MED ORDER — DOCUSATE SODIUM 100 MG PO CAPS
100.0000 mg | ORAL_CAPSULE | Freq: Every day | ORAL | Status: DC
  Administered 2018-12-11: 100 mg via ORAL
  Filled 2018-12-10: qty 1

## 2018-12-10 MED ORDER — CHLORHEXIDINE GLUCONATE CLOTH 2 % EX PADS
6.0000 | MEDICATED_PAD | Freq: Once | CUTANEOUS | Status: DC
  Administered 2018-12-10: 6 via TOPICAL

## 2018-12-10 MED ORDER — ROCURONIUM BROMIDE 10 MG/ML (PF) SYRINGE
PREFILLED_SYRINGE | INTRAVENOUS | Status: AC
  Filled 2018-12-10: qty 10

## 2018-12-10 MED ORDER — DEXAMETHASONE SODIUM PHOSPHATE 10 MG/ML IJ SOLN
INTRAMUSCULAR | Status: DC | PRN
  Administered 2018-12-10: 10 mg via INTRAVENOUS

## 2018-12-10 MED ORDER — FENTANYL CITRATE (PF) 100 MCG/2ML IJ SOLN
INTRAMUSCULAR | Status: AC
  Filled 2018-12-10: qty 2

## 2018-12-10 MED ORDER — FENTANYL CITRATE (PF) 250 MCG/5ML IJ SOLN
INTRAMUSCULAR | Status: AC
  Filled 2018-12-10: qty 5

## 2018-12-10 MED ORDER — EPHEDRINE 5 MG/ML INJ
INTRAVENOUS | Status: AC
  Filled 2018-12-10: qty 10

## 2018-12-10 MED ORDER — ONDANSETRON HCL 4 MG/2ML IJ SOLN
INTRAMUSCULAR | Status: DC | PRN
  Administered 2018-12-10: 4 mg via INTRAVENOUS

## 2018-12-10 MED ORDER — LACTATED RINGERS IV SOLN
INTRAVENOUS | Status: DC | PRN
  Administered 2018-12-10: 07:00:00 via INTRAVENOUS

## 2018-12-10 MED ORDER — ROCURONIUM BROMIDE 10 MG/ML (PF) SYRINGE
PREFILLED_SYRINGE | INTRAVENOUS | Status: DC | PRN
  Administered 2018-12-10: 80 mg via INTRAVENOUS

## 2018-12-10 MED ORDER — DEXTROSE-NACL 5-0.45 % IV SOLN
INTRAVENOUS | Status: DC
  Administered 2018-12-10 – 2018-12-11 (×2): via INTRAVENOUS

## 2018-12-10 MED ORDER — LIDOCAINE 2% (20 MG/ML) 5 ML SYRINGE
INTRAMUSCULAR | Status: DC | PRN
  Administered 2018-12-10: 40 mg via INTRAVENOUS

## 2018-12-10 MED ORDER — HYDROMORPHONE HCL 2 MG PO TABS
2.0000 mg | ORAL_TABLET | ORAL | Status: DC | PRN
  Administered 2018-12-10 – 2018-12-11 (×4): 2 mg via ORAL
  Filled 2018-12-10 (×4): qty 1

## 2018-12-10 MED ORDER — ONDANSETRON HCL 4 MG/2ML IJ SOLN
4.0000 mg | Freq: Once | INTRAMUSCULAR | Status: AC | PRN
  Administered 2018-12-10: 4 mg via INTRAVENOUS

## 2018-12-10 MED ORDER — LIDOCAINE 2% (20 MG/ML) 5 ML SYRINGE
INTRAMUSCULAR | Status: AC
  Filled 2018-12-10: qty 5

## 2018-12-10 MED ORDER — ONDANSETRON HCL 4 MG/2ML IJ SOLN
INTRAMUSCULAR | Status: AC
  Filled 2018-12-10: qty 2

## 2018-12-10 MED ORDER — METHOCARBAMOL 500 MG PO TABS
500.0000 mg | ORAL_TABLET | Freq: Four times a day (QID) | ORAL | Status: DC | PRN
  Administered 2018-12-10 – 2018-12-11 (×2): 500 mg via ORAL
  Filled 2018-12-10 (×2): qty 1

## 2018-12-10 MED ORDER — HEPARIN SODIUM (PORCINE) 5000 UNIT/ML IJ SOLN
5000.0000 [IU] | Freq: Once | INTRAMUSCULAR | Status: AC
  Administered 2018-12-10: 5000 [IU] via SUBCUTANEOUS
  Filled 2018-12-10: qty 1

## 2018-12-10 MED ORDER — SODIUM CHLORIDE 0.9 % IV SOLN
INTRAVENOUS | Status: DC | PRN
  Administered 2018-12-10: 08:00:00 25 ug/min via INTRAVENOUS

## 2018-12-10 MED ORDER — SCOPOLAMINE 1 MG/3DAYS TD PT72
MEDICATED_PATCH | TRANSDERMAL | Status: DC | PRN
  Administered 2018-12-10: 1 via TRANSDERMAL

## 2018-12-10 MED ORDER — 0.9 % SODIUM CHLORIDE (POUR BTL) OPTIME
TOPICAL | Status: DC | PRN
  Administered 2018-12-10: 3000 mL

## 2018-12-10 MED ORDER — MIDAZOLAM HCL 5 MG/5ML IJ SOLN
INTRAMUSCULAR | Status: DC | PRN
  Administered 2018-12-10: 2 mg via INTRAVENOUS

## 2018-12-10 MED ORDER — ACETAMINOPHEN 500 MG PO TABS: 500.0000 mg | ORAL_TABLET | Freq: Every day | ORAL | Status: DC | PRN

## 2018-12-10 MED ORDER — FLUTICASONE PROPIONATE 93 MCG/ACT NA EXHU: 2.0000 | INHALANT_SUSPENSION | Freq: Two times a day (BID) | NASAL | Status: DC | PRN

## 2018-12-10 MED ORDER — SIMVASTATIN 20 MG PO TABS
40.0000 mg | ORAL_TABLET | Freq: Every day | ORAL | Status: DC
  Administered 2018-12-10: 40 mg via ORAL
  Filled 2018-12-10: qty 2

## 2018-12-10 MED ORDER — DEXAMETHASONE SODIUM PHOSPHATE 10 MG/ML IJ SOLN
INTRAMUSCULAR | Status: AC
  Filled 2018-12-10: qty 1

## 2018-12-10 MED ORDER — MIDAZOLAM HCL 2 MG/2ML IJ SOLN
INTRAMUSCULAR | Status: AC
  Filled 2018-12-10: qty 2

## 2018-12-10 MED ORDER — PROMETHAZINE HCL 25 MG/ML IJ SOLN
6.2500 mg | INTRAMUSCULAR | Status: DC | PRN
  Administered 2018-12-10: 6.25 mg via INTRAVENOUS
  Filled 2018-12-10: qty 1

## 2018-12-10 MED ORDER — CHLORHEXIDINE GLUCONATE CLOTH 2 % EX PADS
6.0000 | MEDICATED_PAD | Freq: Once | CUTANEOUS | Status: DC
  Administered 2018-12-10: 06:00:00 6 via TOPICAL

## 2018-12-10 MED ORDER — PROPOFOL 10 MG/ML IV BOLUS
INTRAVENOUS | Status: DC | PRN
  Administered 2018-12-10: 170 mg via INTRAVENOUS

## 2018-12-10 MED ORDER — PROPOFOL 10 MG/ML IV BOLUS
INTRAVENOUS | Status: AC
  Filled 2018-12-10: qty 20

## 2018-12-10 MED ORDER — ALBUTEROL SULFATE HFA 108 (90 BASE) MCG/ACT IN AERS: 2.0000 | INHALATION_SPRAY | Freq: Four times a day (QID) | RESPIRATORY_TRACT | Status: DC | PRN

## 2018-12-10 MED ORDER — SCOPOLAMINE 1 MG/3DAYS TD PT72
MEDICATED_PATCH | TRANSDERMAL | Status: AC
  Filled 2018-12-10: qty 1

## 2018-12-10 MED ORDER — CEFAZOLIN SODIUM-DEXTROSE 2-4 GM/100ML-% IV SOLN
2.0000 g | INTRAVENOUS | Status: AC
  Administered 2018-12-10: 2 g via INTRAVENOUS
  Filled 2018-12-10: qty 100

## 2018-12-10 MED ORDER — SUGAMMADEX SODIUM 200 MG/2ML IV SOLN
INTRAVENOUS | Status: DC | PRN
  Administered 2018-12-10: 200 mg via INTRAVENOUS

## 2018-12-10 MED ORDER — MENTHOL 3 MG MT LOZG
1.0000 | LOZENGE | OROMUCOSAL | Status: DC | PRN
  Administered 2018-12-11: 3 mg via ORAL
  Filled 2018-12-10: qty 9

## 2018-12-10 MED ORDER — CEFAZOLIN SODIUM-DEXTROSE 1-4 GM/50ML-% IV SOLN
1.0000 g | Freq: Three times a day (TID) | INTRAVENOUS | Status: DC
  Administered 2018-12-10 – 2018-12-11 (×3): 1 g via INTRAVENOUS
  Filled 2018-12-10 (×4): qty 50

## 2018-12-10 MED ORDER — FENTANYL CITRATE (PF) 100 MCG/2ML IJ SOLN
25.0000 ug | INTRAMUSCULAR | Status: DC | PRN
  Administered 2018-12-10 (×3): 25 ug via INTRAVENOUS

## 2018-12-10 SURGICAL SUPPLY — 48 items
ADH SKN CLS APL DERMABOND .7 (GAUZE/BANDAGES/DRESSINGS) ×2
BALL CTTN LRG ABS STRL LF (GAUZE/BANDAGES/DRESSINGS)
BANDAGE ACE 6X5 VEL STRL LF (GAUZE/BANDAGES/DRESSINGS) ×2 IMPLANT
BANDAGE ELASTIC 6 VELCRO ST LF (GAUZE/BANDAGES/DRESSINGS) ×2 IMPLANT
BINDER BREAST LRG (GAUZE/BANDAGES/DRESSINGS) IMPLANT
BINDER BREAST XLRG (GAUZE/BANDAGES/DRESSINGS) ×2 IMPLANT
CANISTER SUCT 3000ML PPV (MISCELLANEOUS) ×3 IMPLANT
CHLORAPREP W/TINT 26ML (MISCELLANEOUS) ×5 IMPLANT
COTTONBALL LRG STERILE PKG (GAUZE/BANDAGES/DRESSINGS) IMPLANT
COVER SURGICAL LIGHT HANDLE (MISCELLANEOUS) ×3 IMPLANT
COVER WAND RF STERILE (DRAPES) ×1 IMPLANT
DERMABOND ADVANCED (GAUZE/BANDAGES/DRESSINGS) ×4
DERMABOND ADVANCED .7 DNX12 (GAUZE/BANDAGES/DRESSINGS) ×1 IMPLANT
DRAPE HALF SHEET 40X57 (DRAPES) IMPLANT
DRAPE ORTHO SPLIT 77X108 STRL (DRAPES) ×6
DRAPE SURG ORHT 6 SPLT 77X108 (DRAPES) ×2 IMPLANT
DRAPE WARM FLUID 44X44 (DRAPES) ×3 IMPLANT
DRSG PAD ABDOMINAL 8X10 ST (GAUZE/BANDAGES/DRESSINGS) ×6 IMPLANT
ELECT CAUTERY BLADE 6.4 (BLADE) ×3 IMPLANT
ELECT REM PT RETURN 9FT ADLT (ELECTROSURGICAL) ×3
ELECTRODE REM PT RTRN 9FT ADLT (ELECTROSURGICAL) ×1 IMPLANT
GAUZE SPONGE 4X4 12PLY STRL (GAUZE/BANDAGES/DRESSINGS) ×2 IMPLANT
GLOVE BIO SURGEON STRL SZ7.5 (GLOVE) ×3 IMPLANT
GLOVE BIOGEL PI IND STRL 8 (GLOVE) ×1 IMPLANT
GLOVE BIOGEL PI INDICATOR 8 (GLOVE) ×2
GOWN STRL REUS W/ TWL LRG LVL3 (GOWN DISPOSABLE) ×1 IMPLANT
GOWN STRL REUS W/ TWL XL LVL3 (GOWN DISPOSABLE) ×1 IMPLANT
GOWN STRL REUS W/TWL LRG LVL3 (GOWN DISPOSABLE) ×3
GOWN STRL REUS W/TWL XL LVL3 (GOWN DISPOSABLE) ×3
KIT BASIN OR (CUSTOM PROCEDURE TRAY) ×3 IMPLANT
KIT TURNOVER KIT B (KITS) ×3 IMPLANT
MARKER SKIN DUAL TIP RULER LAB (MISCELLANEOUS) ×3 IMPLANT
NS IRRIG 1000ML POUR BTL (IV SOLUTION) ×6 IMPLANT
PACK GENERAL/GYN (CUSTOM PROCEDURE TRAY) ×3 IMPLANT
PAD ABD 8X10 STRL (GAUZE/BANDAGES/DRESSINGS) ×12 IMPLANT
PAD ARMBOARD 7.5X6 YLW CONV (MISCELLANEOUS) ×3 IMPLANT
PENCIL SMOKE EVACUATOR (MISCELLANEOUS) ×3 IMPLANT
PIN SAFETY STERILE (MISCELLANEOUS) ×3 IMPLANT
SPONGE LAP 18X18 RF (DISPOSABLE) ×4 IMPLANT
SUT MNCRL AB 3-0 PS2 18 (SUTURE) ×12 IMPLANT
SUT MNCRL AB 4-0 PS2 18 (SUTURE) ×6 IMPLANT
SUT MON AB 2-0 CT1 36 (SUTURE) ×14 IMPLANT
SUT PROLENE 3 0 PS 2 (SUTURE) ×12 IMPLANT
SUT PROLENE 5 0 PS 2 (SUTURE) IMPLANT
SUT SILK 4 0 PS 2 (SUTURE) IMPLANT
SUT VIC AB 3-0 SH 18 (SUTURE) IMPLANT
TOWEL GREEN STERILE FF (TOWEL DISPOSABLE) ×3 IMPLANT
TOWEL OR 17X26 10 PK STRL BLUE (TOWEL DISPOSABLE) ×3 IMPLANT

## 2018-12-10 NOTE — Anesthesia Procedure Notes (Signed)
Anesthesia Regional Block: Pectoralis block   Pre-Anesthetic Checklist: ,, timeout performed, Correct Patient, Correct Site, Correct Laterality, Correct Procedure, Correct Position, site marked, Risks and benefits discussed,  Surgical consent,  Pre-op evaluation,  At surgeon's request and post-op pain management  Laterality: Left  Prep: chloraprep       Needles:  Injection technique: Single-shot  Needle Type: Echogenic Needle     Needle Length: 9cm      Additional Needles:   Procedures:,,,, ultrasound used (permanent image in chart),,,,  Narrative:  Start time: 12/10/2018 7:10 AM End time: 12/10/2018 7:15 AM Injection made incrementally with aspirations every 5 mL.  Performed by: Personally   Additional Notes: 25 cc 0.25% Bupivacaine with 1:200 epi injected easily

## 2018-12-10 NOTE — Anesthesia Postprocedure Evaluation (Signed)
Anesthesia Post Note  Patient: Brianna Carroll  Procedure(s) Performed: BILATERAL MAMMARY REDUCTION  (BREAST) (Bilateral Breast)     Patient location during evaluation: PACU Anesthesia Type: General Level of consciousness: awake and alert Pain management: pain level controlled Vital Signs Assessment: post-procedure vital signs reviewed and stable Respiratory status: spontaneous breathing, nonlabored ventilation, respiratory function stable and patient connected to nasal cannula oxygen Cardiovascular status: blood pressure returned to baseline and stable Postop Assessment: no apparent nausea or vomiting Anesthetic complications: no    Last Vitals:  Vitals:   12/10/18 1330 12/10/18 1410  BP: 126/75 111/61  Pulse: (!) 104 80  Resp: 18 18  Temp:  (!) 36.4 C  SpO2: 93% 98%    Last Pain:  Vitals:   12/10/18 1536  TempSrc:   PainSc: 8                  Carlisha Wisler COKER

## 2018-12-10 NOTE — Addendum Note (Signed)
Addendum  created 12/10/18 1701 by Roberts Gaudy, MD   Child order released for a procedure order, Clinical Note Signed, Intraprocedure Blocks edited

## 2018-12-10 NOTE — Progress Notes (Signed)
Rings removed by [facility], CRNA, placed in pink cup and placed in patient belonging bag.

## 2018-12-10 NOTE — Brief Op Note (Signed)
12/10/2018  11:16 AM  PATIENT:  Brianna Carroll  58 y.o. female  PRE-OPERATIVE DIAGNOSIS:  BILATERAL BREAST HYPERTROPHY  POST-OPERATIVE DIAGNOSIS:  BILATERAL BREAST HYPERTROPHY  PROCEDURE:  Procedure(s): BILATERAL MAMMARY REDUCTION  (BREAST) (Bilateral)  SURGEON:  Surgeon(s) and Role:    Crissie Reese, MD - Primary  PHYSICIAN ASSISTANT:   ASSISTANTS: Peyton, RNFA   ANESTHESIA:   general  EBL:  100 mL   BLOOD ADMINISTERED:none  DRAINS: (1) Jackson-Pratt drain(s) with closed bulb suction in the right and left breast   LOCAL MEDICATIONS USED:  NONE  SPECIMEN:  Source of Specimen:  bilateral breast tissue  DISPOSITION OF SPECIMEN:  PATHOLOGY  COUNTS:  YES  TOURNIQUET:  * No tourniquets in log *  DICTATION: .Other Dictation: Dictation Number [id number]  PLAN OF CARE: Admit for overnight observation  PATIENT DISPOSITION:  PACU - hemodynamically stable.   Delay start of Pharmacological VTE agent (>24hrs) due to surgical blood loss or risk of bleeding: not applicable

## 2018-12-10 NOTE — Plan of Care (Signed)
  Problem: Pain Managment: Goal: General experience of comfort will improve Outcome: Progressing   

## 2018-12-10 NOTE — Anesthesia Procedure Notes (Signed)
Anesthesia Regional Block: Pectoralis block   Pre-Anesthetic Checklist: ,, timeout performed, Correct Patient, Correct Site, Correct Laterality, Correct Procedure, Correct Position, site marked, Risks and benefits discussed,  Surgical consent,  Pre-op evaluation,  At surgeon's request and post-op pain management  Laterality: Right     Needles:  Injection technique: Single-shot      Additional Needles:   Narrative:  Start time: 12/10/2018 7:20 AM End time: 12/10/2018 7:25 AM Injection made incrementally with aspirations every 5 mL.  Performed by: Personally  Anesthesiologist: Roberts Gaudy, MD  Additional Notes: 25 cc 0.25% Bupivacaine with 1:200 epi injected easily

## 2018-12-10 NOTE — Anesthesia Preprocedure Evaluation (Addendum)
Anesthesia Evaluation  Patient identified by MRN, date of birth, ID band Patient awake    Reviewed: Allergy & Precautions, NPO status , Patient's Chart, lab work & pertinent test results  Airway Mallampati: II  TM Distance: >3 FB Neck ROM: Full    Dental  (+) Teeth Intact, Dental Advisory Given   Pulmonary    breath sounds clear to auscultation       Cardiovascular  Rhythm:Regular Rate:Normal     Neuro/Psych    GI/Hepatic   Endo/Other    Renal/GU      Musculoskeletal   Abdominal   Peds  Hematology   Anesthesia Other Findings   Reproductive/Obstetrics                            Anesthesia Physical Anesthesia Plan  ASA: II  Anesthesia Plan: General   Post-op Pain Management:  Regional for Post-op pain   Induction: Intravenous  PONV Risk Score and Plan: Ondansetron, Dexamethasone and Scopolamine patch - Pre-op  Airway Management Planned: Oral ETT  Additional Equipment:   Intra-op Plan:   Post-operative Plan: Extubation in OR  Informed Consent: I have reviewed the patients History and Physical, chart, labs and discussed the procedure including the risks, benefits and alternatives for the proposed anesthesia with the patient or authorized representative who has indicated his/her understanding and acceptance.     Dental advisory given  Plan Discussed with: CRNA and Anesthesiologist  Anesthesia Plan Comments:         Anesthesia Quick Evaluation

## 2018-12-10 NOTE — Transfer of Care (Signed)
Immediate Anesthesia Transfer of Care Note  Patient: Brianna Carroll  Procedure(s) Performed: BILATERAL MAMMARY REDUCTION  (BREAST) (Bilateral Breast)  Patient Location: PACU  Anesthesia Type:GA combined with regional for post-op pain  Level of Consciousness: awake, alert  and oriented  Airway & Oxygen Therapy: Patient Spontanous Breathing and Patient connected to nasal cannula oxygen  Post-op Assessment: Report given to RN, Post -op Vital signs reviewed and stable and Patient moving all extremities  Post vital signs: Reviewed and stable  Last Vitals:  Vitals Value Taken Time  BP 120/83 12/10/18 1127  Temp    Pulse 103 12/10/18 1128  Resp 8 12/10/18 1128  SpO2 92 % 12/10/18 1128  Vitals shown include unvalidated device data.  Last Pain:  Vitals:   12/10/18 0602  TempSrc: Oral         Complications: No apparent anesthesia complications

## 2018-12-10 NOTE — H&P (Signed)
I have re-examined and re-evaluated the patient and there are no changes.   Please see office notes in paper chart for H&P.

## 2018-12-10 NOTE — Anesthesia Procedure Notes (Signed)
Procedure Name: Intubation Date/Time: 12/10/2018 7:40 AM Performed by: Kyung Rudd, CRNA Pre-anesthesia Checklist: Patient identified, Emergency Drugs available, Suction available and Patient being monitored Patient Re-evaluated:Patient Re-evaluated prior to induction Oxygen Delivery Method: Circle system utilized Preoxygenation: Pre-oxygenation with 100% oxygen Induction Type: IV induction Ventilation: Mask ventilation without difficulty and Oral airway inserted - appropriate to patient size Laryngoscope Size: Mac and 3 Grade View: Grade I Tube type: Oral Tube size: 7.0 mm Number of attempts: 1 Airway Equipment and Method: Stylet Placement Confirmation: ETT inserted through vocal cords under direct vision,  positive ETCO2 and breath sounds checked- equal and bilateral Secured at: 21 cm Tube secured with: Tape Dental Injury: Teeth and Oropharynx as per pre-operative assessment

## 2018-12-10 NOTE — Op Note (Signed)
NAME: Brianna Carroll, Brianna Carroll MEDICAL RECORD [id number] ACCOUNT [id number] DATE OF BIRTH:Oct 18, 1960 FACILITY: [facility] LOCATION: [facility] PHYSICIAN:Zeda Gangwer Ann Held, MD  OPERATIVE REPORT  DATE OF PROCEDURE:  12/10/2018  PREOPERATIVE DIAGNOSIS:  Bilateral breast hypertrophy.  POSTOPERATIVE DIAGNOSIS:  Bilateral breast hypertrophy.  PROCEDURE PERFORMED:  Bilateral breast reduction.  SURGEON:  Crissie Reese, MD  ASSISTANT: Ria Clock, RNFA  ANESTHESIA:  General.  ESTIMATED BLOOD LOSS:  100 mL.  DRAINS:  One 19-French on each side.  CLINICAL NOTE:  A 58 year old woman complains of upper back pain, neck pain, shoulder pain, and bra strap shoulder grooving with large breasts and requested breast reduction.  The nature of the procedure, risks and possible complications were discussed  with her in detail.  She understood these risks and wished to proceed.  DESCRIPTION OF PROCEDURE:  The patient was marked in the holding area in a full standing position.  She was taken to the operating room and placed supine.  After successful general anesthesia, she was prepped with ChloraPrep, and after waiting 3 minutes,  she was draped with sterile drapes.  An 8 cm wide inferior nipple areolar pedicle was designed, and the 36 mm marker marked the nipple areolar complex.  Incisions were made and inferior pedicles were deepithelialized.  The pedicles were then isolated  from surrounding tissue, beveling outward both medial and lateral in order to ensure a broader attachment at the level of the chest wall that was present in the level of the skin.  This was done in order to preserve blood supply to the nipple areolar  complexes.  At the conclusion of this dissection, nipple areolar complexes were inspected and found to have excellent color and bright red bleeding around the periphery bilaterally consistent with viability.  The resections were performed medial,  central, and lateral, and a total of 402 g from the smaller  left breast and 507 g from the right breast were resected.  Both sides were irrigated thoroughly with saline.  Meticulous hemostasis was achieved using electrocautery, and 19-French Blake drains  were positioned and brought out through the lateral aspect of the wound on each side.  The closures with 2-0 Monocryl interrupted inverted deep dermal sutures and running 3-0 Monocryl subcuticular suture.  A measurement was then taken 5 cm up from the  inframammary crease, and the 36 mm marker marked the nipple areolar complex site.  This tissue was resected and irrigated with saline and again hemostasis with electrocautery.  Nipple areolar complexes were brought through these openings and were  inspected and again found to have excellent color with bright red bleeding around the periphery consistent with viability.  Nipple areolar insetting with 3-0 Monocryl interrupted inverted deep dermal sutures and running 4-0 Monocryl subcuticular suture.   Dermabond, dry sterile dressings, circumferential Ace wrap, and the breast vest were positioned.  She was transferred to the recovery room in stable, having tolerated the procedure well.  DISPOSITION:  She will be observed overnight.  LN/NUANCE  D:12/10/2018 T:12/10/2018 JOB:[id number]

## 2018-12-11 ENCOUNTER — Encounter (HOSPITAL_COMMUNITY): Payer: Self-pay | Admitting: Plastic Surgery

## 2018-12-11 DIAGNOSIS — N62 Hypertrophy of breast: Secondary | ICD-10-CM | POA: Diagnosis not present

## 2018-12-11 NOTE — Discharge Instructions (Addendum)
No lifting for 6 weeks No vigorous activity for 6 weeks (including outdoor walks) No driving for 4 weeks OK to walk up stairs slowly No activities. Nolifting. No raising arms overhead. No heat. No ice. Expect drainage on dressings. Stay propped up Use incentive spirometer at home every hour while awake No shower  Take an over-the-counter stool softener (such as Colace) while on pain medication See Dr. Harlow Mares in office next week. For questions call [phone] or [phone] (after hours and weekends)

## 2018-12-11 NOTE — Discharge Summary (Signed)
Physician Discharge Summary  Patient ID: Brianna Carroll MRN: [id number] DOB/AGE: 02/27/1961 58 y.o.  Admit date: 12/10/2018 Discharge date: 12/11/2018  Admission Diagnoses: Breast hypertrophy  Discharge Diagnoses: Same Active Problems:   Breast hypertrophy in female   Discharged Condition: good  Hospital Course: Bilateral breast reduction. Stable post-op with good pain control.  Treatments: antibiotics: Ancef  Discharge Exam: Blood pressure (!) 95/57, pulse (!) 57, temperature 98.6 F (37 C), temperature source Oral, resp. rate 18, height 5\' 1"  (1.549 m), weight 76.2 kg, last menstrual period 09/29/2013, SpO2 97 %. Breasts soft. No evidence of bleeding or infection. Nipple complexes have good color and are viable. Drains have thin drainage and are removed.  Disposition: Discharge disposition: 01-Home or Self Care           Signed: Mishell Donalson M 12/11/2018, 9:01 AM

## 2018-12-11 NOTE — Progress Notes (Signed)
Discharged Pt to home, alert, oriented and stable. Instructions given and explained. Belongings returned.

## 2018-12-26 ENCOUNTER — Other Ambulatory Visit: Payer: Self-pay | Admitting: Family Medicine

## 2018-12-26 DIAGNOSIS — E785 Hyperlipidemia, unspecified: Secondary | ICD-10-CM

## 2018-12-28 ENCOUNTER — Ambulatory Visit: Payer: BC Managed Care – PPO | Admitting: Allergy and Immunology

## 2018-12-29 MED FILL — SHINGRIX 50 MCG SUS: 50 | 30 days supply | Qty: 1 | Fill #1

## 2019-01-25 ENCOUNTER — Other Ambulatory Visit: Payer: Self-pay | Admitting: Obstetrics & Gynecology

## 2019-01-25 NOTE — Telephone Encounter (Signed)
Medication refill request: Paroxetine  Last AEX:  07/10/18 SM Next AEX: 11/16/19 Last MMG (if hormonal medication request): 03/13/18 BIRADS 1 negative/density b Refill authorized: Please advise; Dr. Sabra Heck out of office today

## 2019-02-08 ENCOUNTER — Ambulatory Visit: Payer: BC Managed Care – PPO | Admitting: Allergy and Immunology

## 2019-02-24 ENCOUNTER — Other Ambulatory Visit: Payer: Self-pay | Admitting: Family Medicine

## 2019-02-24 DIAGNOSIS — M545 Low back pain, unspecified: Secondary | ICD-10-CM

## 2019-02-24 DIAGNOSIS — G8929 Other chronic pain: Secondary | ICD-10-CM

## 2019-03-10 ENCOUNTER — Other Ambulatory Visit: Payer: Self-pay

## 2019-03-10 ENCOUNTER — Other Ambulatory Visit: Payer: Self-pay | Admitting: Obstetrics & Gynecology

## 2019-03-10 DIAGNOSIS — Z1231 Encounter for screening mammogram for malignant neoplasm of breast: Secondary | ICD-10-CM

## 2019-03-11 ENCOUNTER — Encounter: Payer: Self-pay | Admitting: Family Medicine

## 2019-03-11 ENCOUNTER — Ambulatory Visit (INDEPENDENT_AMBULATORY_CARE_PROVIDER_SITE_OTHER): Payer: BC Managed Care – PPO | Admitting: Family Medicine

## 2019-03-11 VITALS — BP 120/78 | HR 58 | Temp 97.7°F | Resp 18 | Ht 61.0 in | Wt 165.6 lb

## 2019-03-11 DIAGNOSIS — E785 Hyperlipidemia, unspecified: Secondary | ICD-10-CM

## 2019-03-11 DIAGNOSIS — Z23 Encounter for immunization: Secondary | ICD-10-CM

## 2019-03-11 DIAGNOSIS — J302 Other seasonal allergic rhinitis: Secondary | ICD-10-CM

## 2019-03-11 DIAGNOSIS — Z0000 Encounter for general adult medical examination without abnormal findings: Secondary | ICD-10-CM | POA: Diagnosis not present

## 2019-03-11 DIAGNOSIS — G4730 Sleep apnea, unspecified: Secondary | ICD-10-CM | POA: Diagnosis not present

## 2019-03-11 LAB — CBC WITH DIFFERENTIAL/PLATELET
Basophils Absolute: 0 10*3/uL (ref 0.0–0.1)
Basophils Relative: 0.7 % (ref 0.0–3.0)
Eosinophils Absolute: 0.3 10*3/uL (ref 0.0–0.7)
Eosinophils Relative: 5.1 % — ABNORMAL HIGH (ref 0.0–5.0)
HCT: 38.5 % (ref 36.0–46.0)
Hemoglobin: 12.8 g/dL (ref 12.0–15.0)
Lymphocytes Relative: 27.6 % (ref 12.0–46.0)
Lymphs Abs: 1.5 10*3/uL (ref 0.7–4.0)
MCHC: 33.1 g/dL (ref 30.0–36.0)
MCV: 93 fl (ref 78.0–100.0)
Monocytes Absolute: 0.3 10*3/uL (ref 0.1–1.0)
Monocytes Relative: 5.9 % (ref 3.0–12.0)
Neutro Abs: 3.4 10*3/uL (ref 1.4–7.7)
Neutrophils Relative %: 60.7 % (ref 43.0–77.0)
Platelets: 279 10*3/uL (ref 150.0–400.0)
RBC: 4.14 Mil/uL (ref 3.87–5.11)
RDW: 13.1 % (ref 11.5–15.5)
WBC: 5.5 10*3/uL (ref 4.0–10.5)

## 2019-03-11 LAB — LIPID PANEL
Cholesterol: 149 mg/dL (ref 0–200)
HDL: 45.4 mg/dL (ref >39.00)
LDL Cholesterol: 83 mg/dL (ref 0–99)
NonHDL: 104.03
Total CHOL/HDL Ratio: 3
Triglycerides: 106 mg/dL (ref 0.0–149.0)
VLDL: 21.2 mg/dL (ref 0.0–40.0)

## 2019-03-11 LAB — COMPREHENSIVE METABOLIC PANEL
ALT: 16 U/L (ref 0–35)
AST: 19 U/L (ref 0–37)
Albumin: 4.4 g/dL (ref 3.5–5.2)
Alkaline Phosphatase: 34 U/L — ABNORMAL LOW (ref 39–117)
BUN: 22 mg/dL (ref 6–23)
CO2: 31 mEq/L (ref 19–32)
Calcium: 9.8 mg/dL (ref 8.4–10.5)
Chloride: 104 mEq/L (ref 96–112)
Creatinine, Ser: 0.88 mg/dL (ref 0.40–1.20)
GFR: 65.82 mL/min (ref >60.00)
Glucose, Bld: 83 mg/dL (ref 70–99)
Potassium: 4.4 mEq/L (ref 3.5–5.1)
Sodium: 141 mEq/L (ref 135–145)
Total Bilirubin: 0.3 mg/dL (ref 0.2–1.2)
Total Protein: 6.3 g/dL (ref 6.0–8.3)

## 2019-03-11 LAB — TSH: TSH: 2.29 u[IU]/mL (ref 0.35–4.50)

## 2019-03-11 MED ORDER — ANTARA 90 MG PO CAPS: 1.0000 | ORAL_CAPSULE | Freq: Every day | ORAL | 1 refills | Status: DC

## 2019-03-11 MED ORDER — LORATADINE 10 MG PO TABS: 10.0000 mg | ORAL_TABLET | Freq: Every day | ORAL | 11 refills | Status: DC

## 2019-03-11 MED ORDER — SIMVASTATIN 40 MG PO TABS: 40.0000 mg | ORAL_TABLET | Freq: Every day | ORAL | 1 refills | Status: DC

## 2019-03-11 NOTE — Progress Notes (Signed)
Subjective:     Brianna Carroll is a 57 y.o. female and is here for a comprehensive physical exam. The patient reports no problems.-- she does need a referral back to pulm for her cpap machine -- she is having no problems   Social History   Socioeconomic History  . Marital status: Married    Spouse name: [name]  . Number of children: 2  . Years of education: Not on file  . Highest education level: Not on file  Occupational History  . 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 Financial resource strain: Not on file  . Food insecurity    Worry: Not on file    Inability: Not on file  . Transportation needs    Medical: Not on file    Non-medical: Not on file  Tobacco Use  . Smoking status: Never Smoker  . Smokeless tobacco: Never Used  Substance and Sexual Activity  . Alcohol use: Yes    Alcohol/week: 1.0 standard drinks    Types: 1 Standard drinks or equivalent per week  . Drug use: No  . Sexual activity: Yes    Partners: Male    Birth control/protection: Surgical    Comment: BTL  Lifestyle  . Physical activity    Days per week: Not on file    Minutes per session: Not on file  . Stress: Not on file  Relationships  . Social Herbalist on phone: Not on file    Gets together: Not on file    Attends religious service: Not on file    Active member of club or organization: Not on file    Attends meetings of clubs or organizations: Not on file    Relationship status: Not on file  . Intimate partner violence    Fear of current or ex partner: Not on file    Emotionally abused: Not on file    Physically abused: Not on file    Forced sexual activity: Not on file  Other Topics Concern  . Not on file  Social History Narrative  . Not on file   Health Maintenance  Topic Date Due  . HIV Screening  11/25/2023 (Originally 05/26/1975)  . MAMMOGRAM  03/14/2019  . PAP SMEAR-Modifier  04/25/2020  . TETANUS/TDAP   11/26/2023  . COLONOSCOPY  06/07/2026  . INFLUENZA VACCINE  Completed  . Hepatitis C Screening  Completed    The following portions of the patient's history were reviewed and updated as appropriate:  She  has a past medical history of Allergic rhinitis, Anemia, iron deficiency, Anxiety, Hearing loss, Heart murmur, Hyperlipidemia, Low back pain syndrome, Mitral valve prolapse, Sinus headache, Sleep apnea, and Snoring. She does not have any pertinent problems on file. She  has a past surgical history that includes Tubal ligation (05/30/94); Cesarean section (11/90, 12/95); Lumbar disc surgery (2004); Reduction mammaplasty (Bilateral, 12/10/2018); and Breast reduction surgery (Bilateral, 12/10/2018). Her family history includes Allergic rhinitis in her son and son; Asthma in her son; COPD in her sister; Cancer in her brother, father, and mother. She  reports that she has never smoked. She has never used smokeless tobacco. She reports current alcohol use of about 1.0 standard drinks of alcohol per week. She reports that she does not use drugs. She has a current medication list which includes the following prescription(s): albuterol, vitamin d, antara, fluticasone propionate, olopatadine hcl, paroxetine, simvastatin, ibuprofen, loratadine, and  trazodone. Current Outpatient Medications on File Prior to Visit  Medication Sig Dispense Refill  . albuterol (PROVENTIL HFA;VENTOLIN HFA) 108 (90 Base) MCG/ACT inhaler Inhale 2 puffs into the lungs every 6 (six) hours as needed for wheezing or shortness of breath. 1 Inhaler 2  . Cholecalciferol (VITAMIN D) 50 MCG (2000 UT) tablet Take 2,000 Units by mouth at bedtime.    . Fluticasone Propionate (XHANCE) 93 MCG/ACT EXHU Place 2 sprays into the nose 2 (two) times daily. (Patient taking differently: Place 2 sprays into the nose 2 (two) times daily as needed (allergies). ) 16 mL 3  . Olopatadine HCl (PAZEO) 0.7 % SOLN Place 1 drop into both eyes daily. 1 Bottle 5  .  PARoxetine (PAXIL) 10 MG tablet Take 1 tablet (10 mg total) by mouth at bedtime. 90 tablet 0  . ibuprofen (ADVIL) 200 MG tablet Take 400 mg by mouth every 6 (six) hours as needed for headache or moderate pain.    . traZODone (DESYREL) 50 MG tablet TAKE 1 2 TO 1 (ONE HALF TO ONE) TABLET BY MOUTH ONCE DAILY AT BEDTIME FOR SLEEP     No current facility-administered medications on file prior to visit.    She is allergic to doxycycline; codeine; hydrocodone-acetaminophen; and hydrocodone-homatropine..  Review of Systems Review of Systems  Constitutional: Negative for activity change, appetite change and fatigue.  HENT: Negative for hearing loss, congestion, tinnitus and ear discharge.  dentist q47m Eyes: Negative for visual disturbance (see optho q1y -- vision corrected to 20/20 with glasses).  Respiratory: Negative for cough, chest tightness and shortness of breath.   Cardiovascular: Negative for chest pain, palpitations and leg swelling.  Gastrointestinal: Negative for abdominal pain, diarrhea, constipation and abdominal distention.  Genitourinary: Negative for urgency, frequency, decreased urine volume and difficulty urinating.  Musculoskeletal: Negative for back pain, arthralgias and gait problem.  Skin: Negative for color change, pallor and rash.  Neurological: Negative for dizziness, light-headedness, numbness and headaches.  Hematological: Negative for adenopathy. Does not bruise/bleed easily.  Psychiatric/Behavioral: Negative for suicidal ideas, confusion, sleep disturbance, self-injury, dysphoric mood, decreased concentration and agitation.      Objective:    BP 120/78 (BP Location: Left Arm, Patient Position: Sitting, Cuff Size: Normal)   Pulse (!) 58   Temp 97.7 F (36.5 C) (Temporal)   Resp 18   Ht 5\' 1"  (1.549 m)   Wt 165 lb 9.6 oz (75.1 kg)   LMP 09/29/2013 (LMP Unknown)   SpO2 97%   BMI 31.29 kg/m  General appearance: alert, cooperative, appears stated age and no  distress Head: Normocephalic, without obvious abnormality, atraumatic Eyes: conjunctivae/corneas clear. PERRL, EOM's intact. Fundi benign. Ears: normal TM's and external ear canals both ears Nose: Nares normal. Septum midline. Mucosa normal. No drainage or sinus tenderness. Throat: lips, mucosa, and tongue normal; teeth and gums normal Neck: no adenopathy, no carotid bruit, no JVD, supple, symmetrical, trachea midline and thyroid not enlarged, symmetric, no tenderness/mass/nodules Back: symmetric, no curvature. ROM normal. No CVA tenderness. Lungs: clear to auscultation bilaterally Breasts: gyn Heart: regular rate and rhythm, S1, S2 normal, no murmur, click, rub or gallop Abdomen: soft, non-tender; bowel sounds normal; no masses,  no organomegaly Pelvic: deferred Extremities: extremities normal, atraumatic, no cyanosis or edema Pulses: 2+ and symmetric Skin: Skin color, texture, turgor normal. No rashes or lesions Lymph nodes: Cervical, supraclavicular, and axillary nodes normal. Neurologic: Alert and oriented X 3, normal strength and tone. Normal symmetric reflexes. Normal coordination and gait    Assessment:  Healthy female exam.     Plan:    ghm utd Check labs  See After Visit Summary for Counseling Recommendations    1. Hyperlipidemia, unspecified hyperlipidemia type Encouraged heart healthy diet, increase exercise, avoid trans fats, consider a krill oil cap daily - Lipid panel - Comprehensive metabolic panel - Fenofibrate Micronized (ANTARA) 90 MG CAPS; Take 1 capsule by mouth daily.  Dispense: 90 capsule; Refill: 1 - simvastatin (ZOCOR) 40 MG tablet; Take 1 tablet (40 mg total) by mouth at bedtime.  Dispense: 90 tablet; Refill: 1  2. Preventative health care See above  - Lipid panel - CBC with Differential/Platelet - TSH - Comprehensive metabolic panel  3. Hyperlipidemia LDL goal <100 Tolerating statin, encouraged heart healthy diet, avoid trans fats, minimize  simple carbs and saturated fats. Increase exercise as tolerated - Fenofibrate Micronized (ANTARA) 90 MG CAPS; Take 1 capsule by mouth daily.  Dispense: 90 capsule; Refill: 1  4. Seasonal allergies stable - loratadine (CLARITIN) 10 MG tablet; Take 1 tablet (10 mg total) by mouth daily.  Dispense: 30 tablet; Refill: 11  5. Need for influenza vaccination   - Flu Vaccine QUAD 36+ mos IM  6. Sleep apnea, unspecified type  She has cpap machine but has not f/u with pulm in a while  - Ambulatory referral to Pulmonology

## 2019-03-11 NOTE — Patient Instructions (Signed)

## 2019-03-15 ENCOUNTER — Other Ambulatory Visit: Payer: Self-pay | Admitting: Family Medicine

## 2019-03-15 DIAGNOSIS — G8929 Other chronic pain: Secondary | ICD-10-CM

## 2019-03-15 NOTE — Telephone Encounter (Signed)
Looks like patient had breast reduction in June and they took off list.  Do you want to continue?

## 2019-03-22 ENCOUNTER — Encounter: Payer: BC Managed Care – PPO | Admitting: Family Medicine

## 2019-04-01 ENCOUNTER — Other Ambulatory Visit: Payer: Self-pay | Admitting: Family Medicine

## 2019-04-07 NOTE — Telephone Encounter (Signed)
Please advise. Med is on patient's list but no provider name

## 2019-04-08 NOTE — Telephone Encounter (Signed)
We needs to know who was writing it---  Reason she is on it and we need uds and contract if it is going to stay on her list

## 2019-04-26 ENCOUNTER — Other Ambulatory Visit: Payer: Self-pay | Admitting: Family Medicine

## 2019-04-26 ENCOUNTER — Other Ambulatory Visit: Payer: Self-pay | Admitting: Obstetrics and Gynecology

## 2019-04-26 NOTE — Telephone Encounter (Signed)
Medication refill request: Paxil  Last AEX:  07-10-2018 SM  Next AEX: 11-16-19   Last MMG (if hormonal medication request): 03-13-18 density B/BIRADS 1 negative- scheduled for 06-15-2019  Refill authorized: Today, please advise.   Medication pended for #90, 0RF. Please refill if appropriate.

## 2019-04-27 ENCOUNTER — Encounter: Payer: Self-pay | Admitting: Family Medicine

## 2019-05-07 ENCOUNTER — Other Ambulatory Visit: Payer: Self-pay | Admitting: Family Medicine

## 2019-05-07 DIAGNOSIS — J302 Other seasonal allergic rhinitis: Secondary | ICD-10-CM

## 2019-05-07 DIAGNOSIS — G8929 Other chronic pain: Secondary | ICD-10-CM

## 2019-05-07 DIAGNOSIS — M545 Low back pain, unspecified: Secondary | ICD-10-CM

## 2019-05-20 ENCOUNTER — Other Ambulatory Visit: Payer: Self-pay | Admitting: Podiatry

## 2019-05-20 ENCOUNTER — Ambulatory Visit (INDEPENDENT_AMBULATORY_CARE_PROVIDER_SITE_OTHER): Payer: BC Managed Care – PPO

## 2019-05-20 ENCOUNTER — Ambulatory Visit: Payer: BC Managed Care – PPO | Admitting: Podiatry

## 2019-05-20 ENCOUNTER — Other Ambulatory Visit: Payer: Self-pay

## 2019-05-20 DIAGNOSIS — M2010 Hallux valgus (acquired), unspecified foot: Secondary | ICD-10-CM

## 2019-05-20 DIAGNOSIS — M79672 Pain in left foot: Secondary | ICD-10-CM | POA: Diagnosis not present

## 2019-05-20 DIAGNOSIS — M79671 Pain in right foot: Secondary | ICD-10-CM

## 2019-05-20 DIAGNOSIS — M2012 Hallux valgus (acquired), left foot: Secondary | ICD-10-CM | POA: Diagnosis not present

## 2019-05-20 DIAGNOSIS — M21619 Bunion of unspecified foot: Secondary | ICD-10-CM

## 2019-05-20 DIAGNOSIS — M778 Other enthesopathies, not elsewhere classified: Secondary | ICD-10-CM

## 2019-05-20 NOTE — Patient Instructions (Addendum)
Pre-Operative Instructions  Congratulations, you have decided to take an important step towards improving your quality of life.  You can be assured that the doctors and staff at [facility] will be with you every step of the way.  Here are some important things you should know:  1. Plan to be at the surgery center/hospital at least 1 (one) hour prior to your scheduled time, unless otherwise directed by the surgical center/hospital staff.  You must have a responsible adult accompany you, remain during the surgery and drive you home.  Make sure you have directions to the surgical center/hospital to ensure you arrive on time. 2. If you are having surgery at [facility] or [facility] hospitals, you will need a copy of your medical history and physical form from your family physician within one month prior to the date of surgery. We will give you a form for your primary physician to complete.  3. We make every effort to accommodate the date you request for surgery.  However, there are times where surgery dates or times have to be moved.  We will contact you as soon as possible if a change in schedule is required.   4. No aspirin/ibuprofen for one week before surgery.  If you are on aspirin, any non-steroidal anti-inflammatory medications (Mobic, Aleve, Ibuprofen) should not be taken seven (7) days prior to your surgery.  You make take Tylenol for pain prior to surgery.  5. Medications - If you are taking daily heart and blood pressure medications, seizure, reflux, allergy, asthma, anxiety, pain or diabetes medications, make sure you notify the surgery center/hospital before the day of surgery so they can tell you which medications you should take or avoid the day of surgery. 6. No food or drink after midnight the night before surgery unless directed otherwise by surgical center/hospital staff. 7. No alcoholic beverages 24-hours prior to surgery.  No smoking 24-hours prior or 24-hours after  surgery. 8. Wear loose pants or shorts. They should be loose enough to fit over bandages, boots, and casts. 9. Don't wear slip-on shoes. Sneakers are preferred. 10. Bring your boot with you to the surgery center/hospital.  Also bring crutches or a walker if your physician has prescribed it for you.  If you do not have this equipment, it will be provided for you after surgery. 11. If you have not been contacted by the surgery center/hospital by the day before your surgery, call to confirm the date and time of your surgery. 12. Leave-time from work may vary depending on the type of surgery you have.  Appropriate arrangements should be made prior to surgery with your employer. 13. Prescriptions will be provided immediately following surgery by your doctor.  Fill these as soon as possible after surgery and take the medication as directed. Pain medications will not be refilled on weekends and must be approved by the doctor. 14. Remove nail polish on the operative foot and avoid getting pedicures prior to surgery. 15. Wash the night before surgery.  The night before surgery wash the foot and leg well with water and the antibacterial soap provided. Be sure to pay special attention to beneath the toenails and in between the toes.  Wash for at least three (3) minutes. Rinse thoroughly with water and dry well with a towel.  Perform this wash unless told not to do so by your physician.  Enclosed: 1 Ice pack (please put in freezer the night before surgery)   1 Hibiclens skin cleaner     Pre-op instructions  If you have any questions regarding the instructions, please do not hesitate to call our office.  [place]: [address], [place], [place] [address] -- [phone]  [place]: [address] [place], [place] [address] -- [phone]  [place]: [address]  [place], [place] [address] -- [phone]   Website: [email or web address] 

## 2019-05-21 ENCOUNTER — Telehealth: Payer: Self-pay | Admitting: Podiatry

## 2019-05-21 NOTE — Telephone Encounter (Signed)
Pt called to say after she got home and did some thinking she has decided not to have surgery. States its not a good time for her to have it right now. Pt stated when she spoke to Dr. Milinda Pointer she was told she could get an injection if she decided against the surgery. I offered to schedule the pt an appointment for the injection but she declined at this time as she would have to pay another $80 copay. I told the pt to call us if she changed her mind or wanted any other conservative treatment.   I cancelled her surgery in the surgery book as well as Dr. Stephenie Acres schedule in [place]. I also contacted Caren Griffins at [facility] to cancel the surgery with them.

## 2019-05-22 NOTE — Progress Notes (Signed)
Subjective:  Patient ID: Brianna Carroll, female    DOB: Dec 07, 1960,  MRN: [id number] HPI Chief Complaint  Patient presents with  . Foot Pain    BL hallux joint pain x years; worse in 1 mo; 8/10 sharp pains -pt denie injury -worse with tight shoes -w/ redness -pt dneis swellign Tx; none     58 y.o. female presents with the above complaint.   ROS: Denies fever chills nausea vomiting muscle aches pains calf pain back pain chest pain shortness of breath.  Past Medical History:  Diagnosis Date  . Allergic rhinitis   . Anemia, iron deficiency   . Anxiety   . Hearing loss   . Heart murmur   . Hyperlipidemia   . Low back pain syndrome   . Mitral valve prolapse   . Sinus headache   . Sleep apnea    wears CPAP  . Snoring    Past Surgical History:  Procedure Laterality Date  . BREAST REDUCTION SURGERY Bilateral 12/10/2018   Procedure: BILATERAL MAMMARY REDUCTION  (BREAST);  Surgeon: Crissie Reese, MD;  Location: [facility];  Service: Plastics;  Laterality: Bilateral;  . CESAREAN SECTION  11/90, 12/95  . [place] SURGERY  2004  . REDUCTION MAMMAPLASTY Bilateral 12/10/2018  . TUBAL LIGATION  05/30/94    Current Outpatient Medications:  .  albuterol (PROVENTIL HFA;VENTOLIN HFA) 108 (90 Base) MCG/ACT inhaler, Inhale 2 puffs into the lungs every 6 (six) hours as needed for wheezing or shortness of breath., Disp: 1 Inhaler, Rfl: 2 .  Cholecalciferol (VITAMIN D) 50 MCG (2000 UT) tablet, Take 2,000 Units by mouth at bedtime., Disp: , Rfl:  .  Fenofibrate Micronized (ANTARA) 90 MG CAPS, Take 1 capsule by mouth daily., Disp: 90 capsule, Rfl: 1 .  loratadine (CLARITIN) 10 MG tablet, Take 1 tablet by mouth once daily, Disp: 90 tablet, Rfl: 0 .  naproxen (NAPROSYN) 500 MG tablet, TAKE 1 TABLET BY MOUTH TWICE DAILY WITH A MEAL, Disp: 60 tablet, Rfl: 0 .  PARoxetine (PAXIL) 10 MG tablet, TAKE 1 TABLET BY MOUTH AT BEDTIME, Disp: 90 tablet, Rfl: 0 .  simvastatin (ZOCOR) 40 MG tablet, Take 1 tablet  (40 mg total) by mouth at bedtime., Disp: 90 tablet, Rfl: 1 .  traZODone (DESYREL) 50 MG tablet, TAKE 1/2 TO 1 (ONE-HALF TO ONE) TABLET BY MOUTH AT BEDTIME FOR SLEEP, Disp: 30 tablet, Rfl: 0  Allergies  Allergen Reactions  . Doxycycline Hives and Rash  . Codeine Nausea And Vomiting  . Hydrocodone-Acetaminophen Nausea And Vomiting  . Hydrocodone-Homatropine Nausea And Vomiting   Review of Systems Objective:  There were no vitals filed for this visit.  General: Well developed, nourished, in no acute distress, alert and oriented x3   Dermatological: Skin is warm, dry and supple bilateral. Nails x 10 are well maintained; remaining integument appears unremarkable at this time. There are no open sores, no preulcerative lesions, no rash or signs of infection present.  Vascular: Dorsalis Pedis artery and Posterior Tibial artery pedal pulses are 2/4 bilateral with immedate capillary fill time. Pedal hair growth present. No varicosities and no lower extremity edema present bilateral.   Neruologic: Grossly intact via light touch bilateral. Vibratory intact via tuning fork bilateral. Protective threshold with Semmes Wienstein monofilament intact to all pedal sites bilateral. Patellar and Achilles deep tendon reflexes 2+ bilateral. No Babinski or clonus noted bilateral.   Musculoskeletal: No gross boney pedal deformities bilateral. No pain, crepitus, or limitation noted with foot and ankle range of  motion bilateral. Muscular strength 5/5 in all groups tested bilateral.  Hallux abductovalgus deformity is noted bilaterally right appears to be greater than that of the left with pain on palpation of the first metatarsophalangeal joint and the hypertrophic medial condyle of that joint.  She has no crepitation on range of motion though there is some limitation dorsally.  She also has pain on palpation with attempted range of motion of the second metatarsophalangeal joint.  Gait: Unassisted, Nonantalgic.     Radiographs:  Radiographs taken today demonstrate an increase in the first intermetatarsal angle greater than normal value and a hallux abductus angle greater than normal value bilaterally right greater than left.  She also has an elongated plantarflexed second metatarsal.  No other acute findings.  Assessment & Plan:   Assessment: Hallux abductovalgus deformities bilateral right greater than left plantarflexed second metatarsal bilateral right greater than left.    Plan: Discussed etiology pathology conservative surgical therapies at this point time we consented her for an [facility] bunion repair with screw right foot and a second metatarsal osteotomy with screw right foot.  We discussed the pros and cons of surgeries as well as the possible postop complications which may include but not limited to postop pain bleeding swelling infection recurrence need for further surgery overcorrection under correction loss of digit loss of limb loss of life.  We provided her with information regarding the surgery center anesthesia group and instructions for the morning of surgery.  We also discussed injection therapy which at this time she does not want to do however if she is not able to do surgery she will notify us for injection therapy.     Kahleah Crass T. [place], [place]

## 2019-05-25 ENCOUNTER — Ambulatory Visit (INDEPENDENT_AMBULATORY_CARE_PROVIDER_SITE_OTHER): Payer: BC Managed Care – PPO | Admitting: Pulmonary Disease

## 2019-05-25 ENCOUNTER — Encounter: Payer: Self-pay | Admitting: Pulmonary Disease

## 2019-05-25 ENCOUNTER — Other Ambulatory Visit: Payer: Self-pay

## 2019-05-25 DIAGNOSIS — F5104 Psychophysiologic insomnia: Secondary | ICD-10-CM

## 2019-05-25 DIAGNOSIS — G4733 Obstructive sleep apnea (adult) (pediatric): Secondary | ICD-10-CM

## 2019-05-25 DIAGNOSIS — Z9989 Dependence on other enabling machines and devices: Secondary | ICD-10-CM | POA: Diagnosis not present

## 2019-05-25 NOTE — Assessment & Plan Note (Addendum)
We will try to track down your sleep study and CPAP download from [facility] if needed. CPAP supplies will be renewed for a year. Reviewed CPAP download >> will change auto CPAP pressure range to 10 to 14 cm  Weight maintenance, compliance with goal of at least 4-6 hrs every night is the expectation. Advised against medications with sedative side effects Cautioned against driving when sleepy - understanding that sleepiness will vary on a day to day basis

## 2019-05-25 NOTE — Progress Notes (Signed)
Subjective:    Patient ID: Brianna Carroll, female    DOB: 03/27/1961, 58 y.o.   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  She was diagnosed in 2018 and has been maintained on CPAP with nasal pillows since then with good improvement in her daytime somnolence and fatigue.  She reports that her presenting symptoms were snoring and gasping episodes that would wake her up from sleep.  These have subsided with CPAP use.  Epworth sleepiness score is now 3. She also reports sleep onset insomnia for which Dr. Lenna Gilford had given her Xanax for anxiety which worked like a charm but her PCP took her off of this.  She has tried medicines for nasal congestion such as Benadryl and this really works well.  Trazodone at 50 mg has provided some relief. Bedtime is around 10:30 PM, sleep latency can be 1 to 1.5 hours, she sleeps on her side with 1 pillow, reports 1-2 nocturnal awakenings and is out of bed at 7 AM feeling rested without dryness of mouth or headaches.  Weight is mostly unchanged since her sleep study There is no history suggestive of cataplexy, sleep paralysis or parasomnias  CPAP download was obtained and reviewed which shows auto CPAP settings from 4 to 16 cm with EPR level of 2, with average pressure of 11 cm and maximum pressure of 12 cm with good control of events and minimal leak.  Compliance is excellent at 8.5 hours every night without any missed nights.  She reports nasal congestion which is perennial, she underwent allergy testing with Dr. Verlin Fester which was negative, she was diagnosed with mild intermittent asthma, was given Claritin and NeilMed nasal rinse.  She has an albuterol MDI which she only uses occasionally   Past Medical History:  Diagnosis Date  . Allergic rhinitis   . Anemia, iron deficiency   . Anxiety   . Hearing loss   . Heart murmur   . Hyperlipidemia   . Low back pain  syndrome   . Mitral valve prolapse   . Sinus headache   . Sleep apnea    wears CPAP  . Snoring     Past Surgical History:  Procedure Laterality Date  . BREAST REDUCTION SURGERY Bilateral 12/10/2018   Procedure: BILATERAL MAMMARY REDUCTION  (BREAST);  Surgeon: Crissie Reese, MD;  Location: [facility];  Service: Plastics;  Laterality: Bilateral;  . CESAREAN SECTION  11/90, 12/95  . [place] SURGERY  2004  . REDUCTION MAMMAPLASTY Bilateral 12/10/2018  . TUBAL LIGATION  05/30/94    Allergies  Allergen Reactions  . Doxycycline Hives and Rash  . Codeine Nausea And Vomiting  . Hydrocodone-Acetaminophen Nausea And Vomiting  . Hydrocodone-Homatropine Nausea And Vomiting    Social History   Socioeconomic History  . Marital status: Married    Spouse name: [name]  . Number of children: 2  . Years of education: Not on file  . Highest education level: Not on file  Occupational History  . 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 Financial resource strain: Not on file  . Food insecurity    Worry: Not on file    Inability: Not on file  . Transportation needs    Medical: Not on file    Non-medical: Not on file  Tobacco Use  . Smoking status: Never Smoker  . Smokeless  tobacco: Never Used  Substance and Sexual Activity  . Alcohol use: Yes    Alcohol/week: 1.0 standard drinks    Types: 1 Standard drinks or equivalent per week  . Drug use: No  . Sexual activity: Yes    Partners: Male    Birth control/protection: Surgical    Comment: BTL  Lifestyle  . Physical activity    Days per week: Not on file    Minutes per session: Not on file  . Stress: Not on file  Relationships  . Social Herbalist on phone: Not on file    Gets together: Not on file    Attends religious service: Not on file    Active member of club or organization: Not on file    Attends meetings of clubs or organizations: Not on file     Relationship status: Not on file  . Intimate partner violence    Fear of current or ex partner: Not on file    Emotionally abused: Not on file    Physically abused: Not on file    Forced sexual activity: Not on file  Other Topics Concern  . Not on file  Social History Narrative  . Not on file   Family History  Problem Relation Age of Onset  . Cancer Father        Lung   . Cancer Mother        Lung Cancer  . Cancer Brother        throat cancer-heavy smoker  . COPD Sister   . Asthma Son   . Allergic rhinitis Son   . Allergic rhinitis Son      Review of Systems Constitutional: negative for anorexia, fevers and sweats  Eyes: negative for irritation, redness and visual disturbance  Ears, nose, mouth, throat, and face: negative for earaches, epistaxis, nasal congestion and sore throat  Respiratory: negative for cough, dyspnea on exertion, sputum and wheezing  Cardiovascular: negative for chest pain, dyspnea, lower extremity edema, orthopnea, palpitations and syncope  Gastrointestinal: negative for abdominal pain, constipation, diarrhea, melena, nausea and vomiting  Genitourinary:negative for dysuria, frequency and hematuria  Hematologic/lymphatic: negative for bleeding, easy bruising and lymphadenopathy  Musculoskeletal:negative for arthralgias, muscle weakness and stiff joints  Neurological: negative for coordination problems, gait problems, headaches and weakness  Endocrine: negative for diabetic symptoms including polydipsia, polyuria and weight loss     Objective:   Physical Exam  Gen. Pleasant, well-nourished, in no distress, normal affect ENT - no pallor,icterus, no post nasal drip, class II airway Neck: No JVD, no thyromegaly, no carotid bruits Lungs: no use of accessory muscles, no dullness to percussion, clear without rales or rhonchi  Cardiovascular: Rhythm regular, heart sounds  normal, no murmurs or gallops, no peripheral edema Abdomen: soft and non-tender, no  hepatosplenomegaly, BS normal. Musculoskeletal: No deformities, no cyanosis or clubbing Neuro:  alert, non focal       Assessment & Plan:

## 2019-05-25 NOTE — Assessment & Plan Note (Signed)
trial of melatonin 3 to 5 mg 1 to 2 hours before bedtime Alternatively you can increase dose of trazodone  To 100 mg

## 2019-05-25 NOTE — Patient Instructions (Signed)
We will try to track down your sleep study and CPAP download from [facility] if needed. CPAP supplies will be renewed for a year.  For your insomnia, trial of melatonin 3 to 5 mg 1 to 2 hours before bedtime Alternatively you can increase dose of trazodone  To 100 mg

## 2019-05-26 ENCOUNTER — Telehealth: Payer: Self-pay | Admitting: General Surgery

## 2019-05-26 NOTE — Telephone Encounter (Signed)
LVM with Lauren at [facility] [phone]) to get their fax number so request can be sent to obtain a copy of patient sleep study.

## 2019-05-26 NOTE — Telephone Encounter (Signed)
CPAP download was obtained and reviewed which shows auto CPAP settings from 4 to 16 cm with EPR level of 2, with average pressure of 11 cm and maximum pressure of 12 cm with good control of events and minimal leak.  Compliance is excellent at 8.5 hours every night without any missed nights  Change auto CPAP settings to 10 to 14 cm

## 2019-05-26 NOTE — Telephone Encounter (Signed)
RA - please advise on download. Thanks.

## 2019-05-27 ENCOUNTER — Telehealth: Payer: Self-pay | Admitting: Pulmonary Disease

## 2019-05-27 NOTE — Telephone Encounter (Signed)
Reviewed and PSG 07/2016-weight 164 pounds , mild, predominant REM related OSA AHI 6.2/hour RDI 8/hour, REM related RDI 29/hour lowest desaturation 75% during REM sleep

## 2019-06-04 ENCOUNTER — Other Ambulatory Visit: Payer: Self-pay | Admitting: Family Medicine

## 2019-06-04 NOTE — Telephone Encounter (Signed)
Last OV 03/11/19 Last refill 04/26/19 #30/0 Next OV 09/09/19

## 2019-06-15 ENCOUNTER — Ambulatory Visit
Admission: RE | Admit: 2019-06-15 | Discharge: 2019-06-15 | Disposition: A | Discharge status: Home / Self Care | Payer: BC Managed Care – PPO | Source: Ambulatory Visit | Attending: Obstetrics & Gynecology | Admitting: Obstetrics & Gynecology

## 2019-06-15 ENCOUNTER — Other Ambulatory Visit: Payer: Self-pay

## 2019-06-15 DIAGNOSIS — Z1231 Encounter for screening mammogram for malignant neoplasm of breast: Secondary | ICD-10-CM

## 2019-07-10 ENCOUNTER — Other Ambulatory Visit: Payer: Self-pay | Admitting: Family Medicine

## 2019-07-10 DIAGNOSIS — G8929 Other chronic pain: Secondary | ICD-10-CM

## 2019-07-28 ENCOUNTER — Other Ambulatory Visit: Payer: Self-pay | Admitting: Obstetrics & Gynecology

## 2019-07-28 NOTE — Telephone Encounter (Signed)
Medication refill request: Paxil  Last AEX:  07-10-2018 SM  Next AEX: 11-16-19 Last MMG (if hormonal medication request): 06-15-2019 BIRADS 1 negative  Refill authorized: Today, please advise.   Medication pended for #90, 0RF. Please refill if appropriate.

## 2019-08-23 ENCOUNTER — Other Ambulatory Visit: Payer: Self-pay

## 2019-08-24 ENCOUNTER — Ambulatory Visit (INDEPENDENT_AMBULATORY_CARE_PROVIDER_SITE_OTHER): Payer: BC Managed Care – PPO | Admitting: Obstetrics & Gynecology

## 2019-08-24 ENCOUNTER — Encounter: Payer: Self-pay | Admitting: Obstetrics & Gynecology

## 2019-08-24 VITALS — BP 118/68 | HR 60 | Temp 97.9°F | Resp 10 | Ht 61.25 in | Wt 170.0 lb

## 2019-08-24 DIAGNOSIS — E2839 Other primary ovarian failure: Secondary | ICD-10-CM | POA: Diagnosis not present

## 2019-08-24 DIAGNOSIS — Z01419 Encounter for gynecological examination (general) (routine) without abnormal findings: Secondary | ICD-10-CM | POA: Diagnosis not present

## 2019-08-24 MED ORDER — PAROXETINE HCL 10 MG PO TABS: ORAL_TABLET | ORAL | 3 refills | Status: DC

## 2019-08-24 NOTE — Progress Notes (Signed)
59 y.o. G2P2 Married White or Caucasian female here for annual exam. Patient would like to increase the dose of Paxil due to waking up in the middle of the night.   Saw Dr. Elsworth Soho in December.  He suggested she increase her Paxil.  We discussed dosage changes.  Will increase to 15mg  for the next month and see how much this helps.  She is taking Trazodone at night as well.  She take 50mg  of this.    Denies vaginal bleeding.    She did have breast reduction done in June.  Is really pleased about surgical results.  Back pain is so much better.   Patient's last menstrual period was 09/29/2013 (lmp unknown).          Sexually active: Yes.    The current method of family planning is tubal ligation.    Exercising: No.  The patient does not participate in regular exercise at present. Smoker:  no  Health Maintenance: Pap:  04/25/17 Neg. HR HPV:neg              12/16/14 Neg  History of abnormal Pap:  no MMG:  06/15/19 BIRADS 1 negative/density b Colonoscopy:  06/07/16 Normal. F/u 10 years  BMD:   Never.  Discussed doing in December.  Order placed. TDaP:  2015 Pneumonia vaccine(s):  n/a Shingrix:   completed Hep C testing: 03/21/16 neg  Screening Labs: PCP   reports that she has never smoked. She has never used smokeless tobacco. She reports current alcohol use of about 1.0 standard drinks of alcohol per week. She reports that she does not use drugs.  Past Medical History:  Diagnosis Date  . Allergic rhinitis   . Anemia, iron deficiency   . Anxiety   . Hearing loss   . Heart murmur   . Hyperlipidemia   . Low back pain syndrome   . Mitral valve prolapse   . Sinus headache   . Sleep apnea    wears CPAP  . Snoring     Past Surgical History:  Procedure Laterality Date  . BREAST REDUCTION SURGERY Bilateral 12/10/2018   Procedure: BILATERAL MAMMARY REDUCTION  (BREAST);  Surgeon: Crissie Reese, MD;  Location: [facility];  Service: Plastics;  Laterality: Bilateral;  . CESAREAN SECTION  11/90, 12/95   . [place] SURGERY  2004  . REDUCTION MAMMAPLASTY Bilateral 12/10/2018  . TUBAL LIGATION  05/30/94    Current Outpatient Medications  Medication Sig Dispense Refill  . albuterol (PROVENTIL HFA;VENTOLIN HFA) 108 (90 Base) MCG/ACT inhaler Inhale 2 puffs into the lungs every 6 (six) hours as needed for wheezing or shortness of breath. 1 Inhaler 2  . Fenofibrate Micronized (ANTARA) 90 MG CAPS Take 1 capsule by mouth daily. 90 capsule 1  . loratadine (CLARITIN) 10 MG tablet Take 1 tablet by mouth once daily 90 tablet 0  . naproxen (NAPROSYN) 500 MG tablet TAKE 1 TABLET BY MOUTH TWICE DAILY WITH A MEAL (Patient taking differently: Take 500 mg by mouth daily. ) 60 tablet 0  . PARoxetine (PAXIL) 10 MG tablet TAKE 1 TABLET BY MOUTH AT BEDTIME 90 tablet 0  . simvastatin (ZOCOR) 40 MG tablet Take 1 tablet (40 mg total) by mouth at bedtime. 90 tablet 1  . traZODone (DESYREL) 50 MG tablet TAKE 1/2 TO 1 (ONE-HALF TO ONE) TABLET BY MOUTH AT BEDTIME FOR SLEEP 30 tablet 2  . Cholecalciferol (VITAMIN D) 50 MCG (2000 UT) tablet Take 2,000 Units by mouth at bedtime.  No current facility-administered medications for this visit.    Family History  Problem Relation Age of Onset  . Cancer Father        Lung   . Cancer Mother        Lung Cancer  . Cancer Brother        throat cancer-heavy smoker  . COPD Sister   . Asthma Son   . Allergic rhinitis Son   . Allergic rhinitis Son     Review of Systems  All other systems reviewed and are negative.   Exam:   BP 118/68 (BP Location: Right Arm, Patient Position: Sitting, Cuff Size: Normal)   Pulse 60   Temp 97.9 F (36.6 C) (Temporal)   Resp 10   Ht 5' 1.25" (1.556 m)   Wt 170 lb (77.1 kg)   LMP 09/29/2013 (LMP Unknown)   BMI 31.86 kg/m    Height: 5' 1.25" (155.6 cm)  Ht Readings from Last 3 Encounters:  08/24/19 5' 1.25" (1.556 m)  05/25/19 5\' 1"  (1.549 m)  03/11/19 5\' 1"  (1.549 m)    General appearance: alert, cooperative and appears  stated age Head: Normocephalic, without obvious abnormality, atraumatic Neck: no adenopathy, supple, symmetrical, trachea midline and thyroid normal to inspection and palpation Lungs: clear to auscultation bilaterally Breasts: normal appearance, no masses or tenderness Heart: regular rate and rhythm Abdomen: soft, non-tender; bowel sounds normal; no masses,  no organomegaly Extremities: extremities normal, atraumatic, no cyanosis or edema Skin: Skin color, texture, turgor normal. No rashes or lesions Lymph nodes: Cervical, supraclavicular, and axillary nodes normal. No abnormal inguinal nodes palpated Neurologic: Grossly normal   Pelvic: External genitalia:  no lesions              Urethra:  normal appearing urethra with no masses, tenderness or lesions              Bartholins and Skenes: normal                 Vagina: normal appearing vagina with normal color and discharge, no lesions              Cervix: no lesions              Pap taken: No. Bimanual Exam:  Uterus:  normal size, contour, position, consistency, mobility, non-tender              Adnexa: normal adnexa and no mass, fullness, tenderness               Rectovaginal: Confirms               Anus:  normal sphincter tone, no lesions  Chaperone, Terence Lux, CMA, was present for exam.  A:  Well Woman with normal exam PMP, no HRT Hyperlipidemia OSA S/p breast reduction  P:   Mammogram guidelines reviewed pap smear with neg HR HPV 11/18 Colonoscopy is UTD BMD ordered for December when she does next MMG Blood work done in September in September Covid vaccination information given Return annually or prn

## 2019-08-24 NOTE — Patient Instructions (Addendum)
Head to [email or web address] to schedule your appointment indoors or in the drive-thru, or call the [facility] at [phone].  When registration opens, appointments can also be scheduled by phone at [phone] (Option 2) from 8:00 am-5:00 pm, until all appointments have been filled.  Psychiatric nurse.  Dr. Nathanial Rancher.  [facility].  On [address]    [phone]  Dr. Neysa Hotter [facility] [place], [place] [address]  [phone]  (you might ask if he comes to the [place] office on [address]

## 2019-08-27 ENCOUNTER — Other Ambulatory Visit: Payer: Self-pay | Admitting: Family Medicine

## 2019-08-27 DIAGNOSIS — G8929 Other chronic pain: Secondary | ICD-10-CM

## 2019-08-27 DIAGNOSIS — M545 Low back pain, unspecified: Secondary | ICD-10-CM

## 2019-08-30 NOTE — Telephone Encounter (Signed)
Dr Carollee Herter -- please see potential drug / drug interaction below and advise Trazodone request.  Drug-Drug: PARoxetine and traZODone Additive serotonergic effects may occur during coadministration of selective serotonin reuptake inhibitors (SSRIs) and Serotonergic Non-Opioid CNS Depressants, and the risk of developing serotonin syndrome may be increased.

## 2019-09-06 ENCOUNTER — Other Ambulatory Visit: Payer: Self-pay | Admitting: Family Medicine

## 2019-09-07 ENCOUNTER — Other Ambulatory Visit: Payer: Self-pay | Admitting: Obstetrics & Gynecology

## 2019-09-07 DIAGNOSIS — Z1231 Encounter for screening mammogram for malignant neoplasm of breast: Secondary | ICD-10-CM

## 2019-09-08 ENCOUNTER — Other Ambulatory Visit: Payer: Self-pay

## 2019-09-09 ENCOUNTER — Ambulatory Visit: Payer: BC Managed Care – PPO | Admitting: Family Medicine

## 2019-09-30 ENCOUNTER — Ambulatory Visit: Payer: BC Managed Care – PPO | Admitting: Family Medicine

## 2019-10-04 ENCOUNTER — Other Ambulatory Visit: Payer: Self-pay | Admitting: Family Medicine

## 2019-10-04 DIAGNOSIS — J302 Other seasonal allergic rhinitis: Secondary | ICD-10-CM

## 2019-10-22 ENCOUNTER — Other Ambulatory Visit: Payer: Self-pay | Admitting: Family Medicine

## 2019-10-25 ENCOUNTER — Ambulatory Visit: Payer: BC Managed Care – PPO | Admitting: Family Medicine

## 2019-10-30 ENCOUNTER — Other Ambulatory Visit: Payer: Self-pay | Admitting: Obstetrics & Gynecology

## 2019-11-09 ENCOUNTER — Other Ambulatory Visit: Payer: Self-pay

## 2019-11-09 ENCOUNTER — Encounter: Payer: Self-pay | Admitting: Family Medicine

## 2019-11-09 ENCOUNTER — Ambulatory Visit: Payer: BC Managed Care – PPO | Admitting: Family Medicine

## 2019-11-09 VITALS — BP 118/70 | HR 70 | Temp 97.3°F | Resp 18 | Ht 61.25 in | Wt 164.6 lb

## 2019-11-09 DIAGNOSIS — H9313 Tinnitus, bilateral: Secondary | ICD-10-CM | POA: Diagnosis not present

## 2019-11-09 DIAGNOSIS — G4700 Insomnia, unspecified: Secondary | ICD-10-CM

## 2019-11-09 DIAGNOSIS — E785 Hyperlipidemia, unspecified: Secondary | ICD-10-CM | POA: Diagnosis not present

## 2019-11-09 LAB — COMPREHENSIVE METABOLIC PANEL
ALT: 18 U/L (ref 0–35)
AST: 20 U/L (ref 0–37)
Albumin: 4.4 g/dL (ref 3.5–5.2)
Alkaline Phosphatase: 41 U/L (ref 39–117)
BUN: 19 mg/dL (ref 6–23)
CO2: 30 mEq/L (ref 19–32)
Calcium: 9.7 mg/dL (ref 8.4–10.5)
Chloride: 105 mEq/L (ref 96–112)
Creatinine, Ser: 0.81 mg/dL (ref 0.40–1.20)
GFR: 72.26 mL/min (ref >60.00)
Glucose, Bld: 100 mg/dL — ABNORMAL HIGH (ref 70–99)
Potassium: 4.1 mEq/L (ref 3.5–5.1)
Sodium: 140 mEq/L (ref 135–145)
Total Bilirubin: 0.3 mg/dL (ref 0.2–1.2)
Total Protein: 6.5 g/dL (ref 6.0–8.3)

## 2019-11-09 LAB — LIPID PANEL
Cholesterol: 195 mg/dL (ref 0–200)
HDL: 47.7 mg/dL (ref >39.00)
NonHDL: 146.83
Total CHOL/HDL Ratio: 4
Triglycerides: 236 mg/dL — ABNORMAL HIGH (ref 0.0–149.0)
VLDL: 47.2 mg/dL — ABNORMAL HIGH (ref 0.0–40.0)

## 2019-11-09 LAB — LDL CHOLESTEROL, DIRECT: Direct LDL: 117 mg/dL

## 2019-11-09 MED ORDER — TRAZODONE HCL 50 MG PO TABS: 50.0000 mg | ORAL_TABLET | Freq: Every evening | ORAL | 1 refills | Status: DC | PRN

## 2019-11-09 NOTE — Progress Notes (Signed)
Patient ID: Brianna Carroll, female    DOB: 02-12-1961  Age: 59 y.o. MRN: [id number]    Subjective:  Subjective  HPI Brianna Carroll presents for f/u lipids---- she also c/o tinnitis and has app for ent soon   Review of Systems  Constitutional: Negative for appetite change, diaphoresis, fatigue and unexpected weight change.  HENT: Positive for hearing loss and tinnitus.   Eyes: Negative for pain, redness and visual disturbance.  Respiratory: Negative for cough, chest tightness, shortness of breath and wheezing.   Cardiovascular: Negative for chest pain, palpitations and leg swelling.  Endocrine: Negative for cold intolerance, heat intolerance, polydipsia, polyphagia and polyuria.  Genitourinary: Negative for difficulty urinating, dysuria and frequency.  Neurological: Negative for dizziness, light-headedness, numbness and headaches.    History Past Medical History:  Diagnosis Date  . Allergic rhinitis   . Anemia, iron deficiency   . Anxiety   . Hearing loss   . Heart murmur   . Hyperlipidemia   . Low back pain syndrome   . Mitral valve prolapse   . Sinus headache   . Sleep apnea    wears CPAP  . Snoring     She has a past surgical history that includes Tubal ligation (05/30/94); Cesarean section (11/90, 12/95); Lumbar disc surgery (2004); Reduction mammaplasty (Bilateral, 12/10/2018); and Breast reduction surgery (Bilateral, 12/10/2018).   Her family history includes Allergic rhinitis in her son and son; Asthma in her son; COPD in her sister; Cancer in her brother, father, and mother.She reports that she has never smoked. She has never used smokeless tobacco. She reports current alcohol use of about 1.0 standard drinks of alcohol per week. She reports that she does not use drugs.  Current Outpatient Medications on File Prior to Visit  Medication Sig Dispense Refill  . albuterol (PROVENTIL HFA;VENTOLIN HFA) 108 (90 Base) MCG/ACT inhaler Inhale 2 puffs into the lungs every 6  (six) hours as needed for wheezing or shortness of breath. 1 Inhaler 2  . Cholecalciferol (VITAMIN D) 50 MCG (2000 UT) tablet Take 2,000 Units by mouth at bedtime.    . Fenofibrate Micronized (ANTARA) 90 MG CAPS Take 1 capsule by mouth daily. 90 capsule 1  . loratadine (CLARITIN) 10 MG tablet Take 1 tablet (10 mg total) by mouth daily. 30 tablet 0  . naproxen (NAPROSYN) 500 MG tablet TAKE 1 TABLET BY MOUTH TWICE DAILY WITH A MEAL 60 tablet 0  . PARoxetine (PAXIL) 10 MG tablet Take 1 1/2 tabs (15mg ) daily. 135 tablet 3  . simvastatin (ZOCOR) 40 MG tablet Take 1 tablet (40 mg total) by mouth at bedtime. 90 tablet 1   No current facility-administered medications on file prior to visit.     Objective:  Objective  Physical Exam Vitals and nursing note reviewed.  Constitutional:      Appearance: She is well-developed.  HENT:     Head: Normocephalic and atraumatic.     Right Ear: Tympanic membrane and ear canal normal. There is no impacted cerumen.     Left Ear: Tympanic membrane and ear canal normal. There is no impacted cerumen.  Eyes:     Conjunctiva/sclera: Conjunctivae normal.  Neck:     Thyroid: No thyromegaly.     Vascular: No carotid bruit or JVD.  Cardiovascular:     Rate and Rhythm: Normal rate and regular rhythm.     Heart sounds: Normal heart sounds. No murmur.  Pulmonary:     Effort: Pulmonary effort is normal. No respiratory distress.  Breath sounds: Normal breath sounds. No wheezing or rales.  Chest:     Chest wall: No tenderness.  Musculoskeletal:     Cervical back: Normal range of motion and neck supple.  Neurological:     Mental Status: She is alert and oriented to person, place, and time.    BP 118/70 (BP Location: Right Arm, Patient Position: Sitting, Cuff Size: Normal)   Pulse 70   Temp (!) 97.3 F (36.3 C) (Temporal)   Resp 18   Ht 5' 1.25" (1.556 m)   Wt 164 lb 9.6 oz (74.7 kg)   LMP 09/29/2013 (LMP Unknown)   SpO2 98%   BMI 30.85 kg/m  Wt  Readings from Last 3 Encounters:  11/09/19 164 lb 9.6 oz (74.7 kg)  08/24/19 170 lb (77.1 kg)  05/25/19 167 lb 6.4 oz (75.9 kg)     Lab Results  Component Value Date   WBC 5.5 03/11/2019   HGB 12.8 03/11/2019   HCT 38.5 03/11/2019   PLT 279.0 03/11/2019   GLUCOSE 83 03/11/2019   CHOL 149 03/11/2019   TRIG 106.0 03/11/2019   HDL 45.40 03/11/2019   LDLDIRECT 94.4 10/23/2011   LDLCALC 83 03/11/2019   ALT 16 03/11/2019   AST 19 03/11/2019   NA 141 03/11/2019   K 4.4 03/11/2019   CL 104 03/11/2019   CREATININE 0.88 03/11/2019   BUN 22 03/11/2019   CO2 31 03/11/2019   TSH 2.29 03/11/2019    MM 3D SCREEN BREAST BILATERAL  Result Date: 06/15/2019 CLINICAL DATA:  Screening. EXAM: DIGITAL SCREENING BILATERAL MAMMOGRAM WITH TOMO AND CAD COMPARISON:  Previous exam(s). ACR Breast Density Category b: There are scattered areas of fibroglandular density. FINDINGS: There are no findings suspicious for malignancy. Images were processed with CAD. IMPRESSION: No mammographic evidence of malignancy. A result letter of this screening mammogram will be mailed directly to the patient. RECOMMENDATION: Screening mammogram in one year. (Code:SM-B-01Y) BI-RADS CATEGORY  1: Negative. Electronically Signed   By: Audie Pinto M.D.   On: 06/15/2019 12:47     Assessment & Plan:  Plan  I am having Brianna Carroll maintain her albuterol, Vitamin D, Antara, simvastatin, PARoxetine, naproxen, loratadine, and traZODone.  Meds ordered this encounter  Medications  . traZODone (DESYREL) 50 MG tablet    Sig: Take 1 tablet (50 mg total) by mouth at bedtime as needed for sleep.    Dispense:  30 tablet    Refill:  1    Problem List Items Addressed This Visit      Unprioritized   Hyperlipidemia    Encouraged heart healthy diet, increase exercise, avoid trans fats, consider a krill oil cap daily      Relevant Orders   Lipid panel   Comprehensive metabolic panel   Insomnia - Primary    con't  trazadone prn       Relevant Medications   traZODone (DESYREL) 50 MG tablet   Tinnitus of both ears    Pt also c/o hearing loss-- she has app with ent in a few weeks          Follow-up: Return in about 6 months (around 05/11/2020), or if symptoms worsen or fail to improve, for annual exam, fasting.  Ann Held, DO

## 2019-11-09 NOTE — Assessment & Plan Note (Signed)
Encouraged heart healthy diet, increase exercise, avoid trans fats, consider a krill oil cap daily 

## 2019-11-09 NOTE — Patient Instructions (Signed)

## 2019-11-09 NOTE — Assessment & Plan Note (Signed)
con't trazadone prn

## 2019-11-09 NOTE — Assessment & Plan Note (Signed)
Pt also c/o hearing loss-- she has app with ent in a few weeks

## 2019-11-14 ENCOUNTER — Other Ambulatory Visit: Payer: Self-pay | Admitting: Family Medicine

## 2019-11-14 DIAGNOSIS — E785 Hyperlipidemia, unspecified: Secondary | ICD-10-CM

## 2019-11-16 ENCOUNTER — Ambulatory Visit: Payer: BC Managed Care – PPO | Admitting: Obstetrics & Gynecology

## 2019-11-18 ENCOUNTER — Other Ambulatory Visit: Payer: Self-pay | Admitting: Family Medicine

## 2019-11-18 DIAGNOSIS — M545 Low back pain, unspecified: Secondary | ICD-10-CM

## 2019-11-23 ENCOUNTER — Telehealth: Payer: Self-pay

## 2019-11-23 NOTE — Telephone Encounter (Signed)
Patient called in to speak with Dr. Etter Sjogren or the nurse about her Lab results. Please give the patient a call at [phone]

## 2019-11-24 ENCOUNTER — Telehealth: Payer: Self-pay | Admitting: Family Medicine

## 2019-11-24 NOTE — Telephone Encounter (Signed)
CallerJolly Carlini  Call Back # [phone]  Patient is returning a call back lab results.  Please Advise

## 2019-11-24 NOTE — Telephone Encounter (Signed)
Pt called. LVM to call back.

## 2019-11-26 NOTE — Telephone Encounter (Signed)
Pt called. VM left 

## 2019-12-21 ENCOUNTER — Telehealth: Payer: Self-pay

## 2019-12-21 NOTE — Telephone Encounter (Signed)
Nurse Assessment Nurse: Rolena Infante, RN, [place] Date/Time (Eastern Time): 12/20/2019 9:02:16 AM Confirm and document reason for call. If symptomatic, describe symptoms. ---Caller states she has tightness in her chest. Speaking to me gets a little more hard to breathing Has Asthma. Going for 3 days Coughing with mucus. Dark yellow mucus. No fever Has the patient had close contact with a person known or suspected to have the novel coronavirus illness OR traveled / lives in area with major community spread (including international travel) in the last 14 days from the onset of symptoms? * If Asymptomatic, screen for exposure and travel within the last 14 days. ---No Does the patient have any new or worsening symptoms? ---Yes Will a triage be completed? ---Yes Related visit to physician within the last 2 weeks? ---No Does the PT have any chronic conditions? (i.e. diabetes, asthma, this includes High risk factors for pregnancy, etc.) ---Yes List chronic conditions. ---Asthma, Elevated Trigycerides Is this a behavioral health or substance abuse call? ---No Nurse: Rolena Infante, RN, [place] Date/Time (Eastern Time): 12/20/2019 9:53:41 AM Confirm and document reason for call. If symptomatic, describe symptoms. ---Caller states she has done the Albuteral Inhaler. Still has tightness in her chest. Coughing up the mucus. Follow up cough from Asthma GL. Has the patient had close contact with a person known or suspected to have the novel coronavirus illness OR traveled / lives in area with major community spread (including ---NoPLEASE NOTE: All timestamps contained within this report are represented as [place] Standard Time. CONFIDENTIALTY NOTICE: This fax transmission is intended only for the addressee. It contains information that is legally privileged, confidential or otherwise protected from use or disclosure. If you are not the intended recipient, you are strictly prohibited from reviewing, disclosing,  copying using or disseminating any of this information or taking any action in reliance on or regarding this information. If you have received this fax in error, please notify us immediately by telephone so that we can arrange for its return to [place]. Phone: [phone], Toll-Free: [phone], Fax: [phone] Page: 2 of 3 Call Id: [id number] Nurse Assessment international travel) in the last 14 days from the onset of symptoms? * If Asymptomatic, screen for exposure and travel within the last 14 days. Does the patient have any new or worsening symptoms? ---Yes Will a triage be completed? ---Yes Related visit to physician within the last 2 weeks? ---No Does the PT have any chronic conditions? (i.e. diabetes, asthma, this includes High risk factors for pregnancy, etc.) ---Yes List chronic conditions. ---Asthma Is this a behavioral health or substance abuse call? ---No Guidelines Guideline Title Affirmed Question Affirmed Notes Nurse Date/Time (Eastern Time) Asthma Attack [1] Wheezing or coughing AND [2] hasn't used neb or inhaler twice AND [3] it's available Rolena Infante, RN, [place] 12/20/2019 9:05:50 AM Asthma Attack [1] MODERATE asthma attack (e.g., SOB at rest, speaks in phrases, audible wheezes) AND [2] not resolved after 2 nebulizer or inhaler treatments given 20 minutes apart Rolena Infante, RN, [place] 12/20/2019 9:56:01 AM Disp. Time Eilene Ghazi Time) Disposition Final User 12/20/2019 9:00:37 AM Send to Urgent Earley Favor, April 12/20/2019 9:13:04 AM Urgent Home Treatment with Follow-Up Call [facility], RN, Sharl Ma 12/20/2019 9:14:34 AM Send To RN Personal Rolena Infante, RN, Patrice 12/20/2019 10:00:06 AM Go to ED Now (or PCP triage) Loel Lofty, RN, [place] Caller Disagree/Comply Disagree Caller Understands Yes PreDisposition Call Doctor Care Advice Given Per Guideline URGENT HOME TREATMENT WITH FOLLOW-UP CALL: * You should usually improve with the home treatment advice I give you. * I'll call you back  in  30-60 minutes to see how you are doing. ASTHMA QUICK-RELIEF MEDICINE (E.G., ALBUTEROL, SALBUTAMOL, XOPENEX): * Give yourself a nebulizer or inhaler (4 puffs) treatment using your quick-relief medicine (e.g., albuterol, salbutamol) right now. * Give yourself another treatment in 20 minutes. * Then I'll call you back. CALL BACK IF: * You become worse before RN follow-up call * RN hasn't called back within 60 minutes. TRIAGER F/U CALL: * In 30 to 60 minutes, RN calls the adult back. Evaluate response to neb or inhaler: CARE ADVICE given per Asthma Attack (Adult) guideline.PLEASE NOTE: All timestamps contained within this report are represented as [place] Standard Time. CONFIDENTIALTY NOTICE: This fax transmission is intended only for the addressee. It contains information that is legally privileged, confidential or otherwise protected from use or disclosure. If you are not the intended recipient, you are strictly prohibited from reviewing, disclosing, copying using or disseminating any of this information or taking any action in reliance on or regarding this information. If you have received this fax in error, please notify us immediately by telephone so that we can arrange for its return to [place]. Phone: [phone], Toll-Free: [phone], Fax: [phone] Page: 3 of 3 Call Id: [id number] Care Advice Given Per Guideline GO TO ED NOW (OR PCP TRIAGE): * IF NO PCP (PRIMARY CARE PROVIDER) SECOND-LEVEL TRIAGE: You need to be seen within the next hour. Go to the [facility] at _____________ [place] as soon as you can. BRING MEDICINES: CARE ADVICE given per Asthma Attack (Adult) guideline. Comments User: Donnelly Angelica, RN Date/Time Eilene Ghazi Time): 12/20/2019 10:01:24 AM Patient stated she would just wait and see Wanted antibiotics called in No Standinging meds for this condition.  Referrals GO TO FACILITY REFUSED

## 2019-12-21 NOTE — Telephone Encounter (Signed)
Spoke with patient. Pt states feeling a little bit better today but states she just woke up. Pt scheduled with [facility] for tomorrow morning virtual. Pt advised if chest tightness or SOB returns to proceed to the ED or urgent care.

## 2019-12-22 ENCOUNTER — Telehealth (INDEPENDENT_AMBULATORY_CARE_PROVIDER_SITE_OTHER): Payer: BC Managed Care – PPO | Admitting: Family Medicine

## 2019-12-22 ENCOUNTER — Other Ambulatory Visit: Payer: Self-pay

## 2019-12-22 ENCOUNTER — Encounter: Payer: Self-pay | Admitting: Family Medicine

## 2019-12-22 DIAGNOSIS — J0100 Acute maxillary sinusitis, unspecified: Secondary | ICD-10-CM

## 2019-12-22 MED ORDER — PREDNISONE 20 MG PO TABS: 40.0000 mg | ORAL_TABLET | Freq: Every day | ORAL | 0 refills | Status: AC

## 2019-12-22 NOTE — Progress Notes (Signed)
Chief Complaint  Patient presents with  . Cough    5 days  . Sinusitis    Kerby Nora here for URI complaints. Due to COVID-19 pandemic, we are interacting via web portal for an electronic face-to-face visit. I verified patient's ID using 2 identifiers. Patient agreed to proceed with visit via this method. Patient is at home, I am at office. Patient and I are present for visit.   Duration: 1 week  Associated symptoms: sinus congestion, sinus pain, rhinorrhea, itchy watery eyes, ear fullness, wheezing and cough Denies: ear pain, ear drainage, sore throat, shortness of breath, myalgia and fevers Treatment to date: Nyquil, ibuprofen, Flonase Sick contacts: No  Past Medical History:  Diagnosis Date  . Allergic rhinitis   . Anemia, iron deficiency   . Anxiety   . Hearing loss   . Heart murmur   . Hyperlipidemia   . Low back pain syndrome   . Mitral valve prolapse   . Sinus headache   . Sleep apnea    wears CPAP  . Snoring     LMP 09/29/2013 (LMP Unknown)  No conversational dyspnea Age appropriate judgment and insight Nml affect and mood  Acute maxillary sinusitis, recurrence not specified - Plan: predniSONE (DELTASONE) 20 MG tablet  Likely allergic. Pred burst. Send message over weekend if no better. Cont supportive care. F/u prn.  Pt voiced understanding and agreement to the plan.  Andrews, DO 12/22/19 7:46 AM

## 2019-12-27 ENCOUNTER — Telehealth: Payer: Self-pay

## 2019-12-27 ENCOUNTER — Telehealth: Payer: Self-pay | Admitting: Family Medicine

## 2019-12-27 ENCOUNTER — Other Ambulatory Visit: Payer: Self-pay | Admitting: Family Medicine

## 2019-12-27 ENCOUNTER — Other Ambulatory Visit: Payer: Self-pay

## 2019-12-27 DIAGNOSIS — E785 Hyperlipidemia, unspecified: Secondary | ICD-10-CM

## 2019-12-27 DIAGNOSIS — M545 Low back pain, unspecified: Secondary | ICD-10-CM

## 2019-12-27 NOTE — Telephone Encounter (Signed)
Approval received earlier today.

## 2019-12-27 NOTE — Telephone Encounter (Signed)
PA initiated via Covermymeds; KEY: [id number]. Awaiting determination.

## 2019-12-27 NOTE — Telephone Encounter (Signed)
Please help.

## 2019-12-27 NOTE — Telephone Encounter (Signed)
Caller: Anarely Call back phone number: [phone]   Patient when to pick up medicine at pharmacy, however, was told a prior-authorization is needed.  Patient states she received a letter stating she needs to either get a generic version of Antara or a prior authorization is needed.   Please advise.   Pharmacy: [facility] [address], [place] [address]  [place], [place] [address]  Phone:  [phone] Fax:  [phone]

## 2019-12-27 NOTE — Telephone Encounter (Signed)
PA approved. Effective 12/27/19 to 12/26/2021.

## 2019-12-27 NOTE — Telephone Encounter (Signed)
Pt.notified

## 2020-01-14 ENCOUNTER — Other Ambulatory Visit: Payer: Self-pay | Admitting: Family Medicine

## 2020-01-14 DIAGNOSIS — G4700 Insomnia, unspecified: Secondary | ICD-10-CM

## 2020-01-27 ENCOUNTER — Ambulatory Visit: Payer: BC Managed Care – PPO | Admitting: Family Medicine

## 2020-02-01 ENCOUNTER — Telehealth: Payer: Self-pay | Admitting: Family Medicine

## 2020-02-01 NOTE — Telephone Encounter (Signed)
Pt's wants to go to [facility] lab to have blood work. I was told you need to release the lab order in order for them to draw blood. Pt is going in the morning to get lab done.

## 2020-02-01 NOTE — Telephone Encounter (Signed)
Orders have been changed for Brianna Carroll. thanks

## 2020-02-02 ENCOUNTER — Other Ambulatory Visit (INDEPENDENT_AMBULATORY_CARE_PROVIDER_SITE_OTHER): Payer: BC Managed Care – PPO

## 2020-02-02 DIAGNOSIS — E785 Hyperlipidemia, unspecified: Secondary | ICD-10-CM

## 2020-02-02 LAB — COMPREHENSIVE METABOLIC PANEL
ALT: 19 U/L (ref 0–35)
AST: 20 U/L (ref 0–37)
Albumin: 4.3 g/dL (ref 3.5–5.2)
Alkaline Phosphatase: 35 U/L — ABNORMAL LOW (ref 39–117)
BUN: 16 mg/dL (ref 6–23)
CO2: 29 mEq/L (ref 19–32)
Calcium: 9.6 mg/dL (ref 8.4–10.5)
Chloride: 103 mEq/L (ref 96–112)
Creatinine, Ser: 0.94 mg/dL (ref 0.40–1.20)
GFR: 60.81 mL/min (ref >60.00)
Glucose, Bld: 88 mg/dL (ref 70–99)
Potassium: 4.1 mEq/L (ref 3.5–5.1)
Sodium: 139 mEq/L (ref 135–145)
Total Bilirubin: 0.4 mg/dL (ref 0.2–1.2)
Total Protein: 6.8 g/dL (ref 6.0–8.3)

## 2020-02-02 LAB — LIPID PANEL
Cholesterol: 172 mg/dL (ref 0–200)
HDL: 45.8 mg/dL (ref >39.00)
LDL Cholesterol: 93 mg/dL (ref 0–99)
NonHDL: 126.25
Total CHOL/HDL Ratio: 4
Triglycerides: 167 mg/dL — ABNORMAL HIGH (ref 0.0–149.0)
VLDL: 33.4 mg/dL (ref 0.0–40.0)

## 2020-02-07 ENCOUNTER — Encounter: Payer: Self-pay | Admitting: Family Medicine

## 2020-02-07 ENCOUNTER — Other Ambulatory Visit: Payer: Self-pay

## 2020-02-07 ENCOUNTER — Ambulatory Visit: Payer: BC Managed Care – PPO | Admitting: Family Medicine

## 2020-02-07 VITALS — BP 118/78 | HR 66 | Temp 98.0°F | Resp 18 | Ht 61.25 in | Wt 175.0 lb

## 2020-02-07 DIAGNOSIS — E781 Pure hyperglyceridemia: Secondary | ICD-10-CM | POA: Diagnosis not present

## 2020-02-07 DIAGNOSIS — E782 Mixed hyperlipidemia: Secondary | ICD-10-CM | POA: Diagnosis not present

## 2020-02-07 DIAGNOSIS — G4700 Insomnia, unspecified: Secondary | ICD-10-CM | POA: Diagnosis not present

## 2020-02-07 MED ORDER — TRAZODONE HCL 50 MG PO TABS: 50.0000 mg | ORAL_TABLET | Freq: Every evening | ORAL | 1 refills | Status: DC | PRN

## 2020-02-07 NOTE — Progress Notes (Signed)
Patient ID: Brianna Carroll, female    DOB: May 09, 1961  Age: 59 y.o. MRN: [id number]    Subjective:  Subjective  HPI Brianna Carroll presents for f/u cholesterol.   She has no complaints   Review of Systems  Constitutional: Negative for appetite change, diaphoresis, fatigue and unexpected weight change.  Eyes: Negative for pain, redness and visual disturbance.  Respiratory: Negative for cough, chest tightness, shortness of breath and wheezing.   Cardiovascular: Negative for chest pain, palpitations and leg swelling.  Endocrine: Negative for cold intolerance, heat intolerance, polydipsia, polyphagia and polyuria.  Genitourinary: Negative for difficulty urinating, dysuria and frequency.  Neurological: Negative for dizziness, light-headedness, numbness and headaches.    History Past Medical History:  Diagnosis Date  . Allergic rhinitis   . Anemia, iron deficiency   . Anxiety   . Hearing loss   . Heart murmur   . Hyperlipidemia   . Low back pain syndrome   . Mitral valve prolapse   . Sinus headache   . Sleep apnea    wears CPAP  . Snoring     She has a past surgical history that includes Tubal ligation (05/30/94); Cesarean section (11/90, 12/95); Lumbar disc surgery (2004); Reduction mammaplasty (Bilateral, 12/10/2018); and Breast reduction surgery (Bilateral, 12/10/2018).   Her family history includes Allergic rhinitis in her son and son; Asthma in her son; COPD in her sister; Cancer in her brother, father, and mother.She reports that she has never smoked. She has never used smokeless tobacco. She reports current alcohol use of about 1.0 standard drink of alcohol per week. She reports that she does not use drugs.  Current Outpatient Medications on File Prior to Visit  Medication Sig Dispense Refill  . albuterol (PROVENTIL HFA;VENTOLIN HFA) 108 (90 Base) MCG/ACT inhaler Inhale 2 puffs into the lungs every 6 (six) hours as needed for wheezing or shortness of breath. 1 Inhaler 2  .  ANTARA 90 MG CAPS Take 1 capsule by mouth once daily 90 capsule 1  . Cholecalciferol (VITAMIN D) 50 MCG (2000 UT) tablet Take 2,000 Units by mouth at bedtime.    Marland Kitchen loratadine (CLARITIN) 10 MG tablet Take 1 tablet (10 mg total) by mouth daily. 30 tablet 0  . naproxen (NAPROSYN) 500 MG tablet TAKE 1 TABLET BY MOUTH TWICE DAILY WITH A MEAL 60 tablet 2  . PARoxetine (PAXIL) 10 MG tablet Take 1 1/2 tabs (15mg ) daily. 135 tablet 3  . simvastatin (ZOCOR) 40 MG tablet Take 1 tablet (40 mg total) by mouth at bedtime. 90 tablet 1   No current facility-administered medications on file prior to visit.     Objective:  Objective  Physical Exam Vitals and nursing note reviewed.  Constitutional:      Appearance: She is well-developed.  HENT:     Head: Normocephalic and atraumatic.  Eyes:     Conjunctiva/sclera: Conjunctivae normal.  Neck:     Thyroid: No thyromegaly.     Vascular: No carotid bruit or JVD.  Cardiovascular:     Rate and Rhythm: Normal rate and regular rhythm.     Heart sounds: Normal heart sounds. No murmur heard.   Pulmonary:     Effort: Pulmonary effort is normal. No respiratory distress.     Breath sounds: Normal breath sounds. No wheezing or rales.  Chest:     Chest wall: No tenderness.  Musculoskeletal:     Cervical back: Normal range of motion and neck supple.  Neurological:     Mental Status:  She is alert and oriented to person, place, and time.    BP 118/78 (BP Location: Right Arm, Patient Position: Sitting, Cuff Size: Normal)   Pulse 66   Temp 98 F (36.7 C) (Oral)   Resp 18   Ht 5' 1.25" (1.556 m)   Wt 175 lb (79.4 kg)   LMP 09/29/2013 (LMP Unknown)   SpO2 97%   BMI 32.80 kg/m  Wt Readings from Last 3 Encounters:  02/07/20 175 lb (79.4 kg)  11/09/19 164 lb 9.6 oz (74.7 kg)  08/24/19 170 lb (77.1 kg)     Lab Results  Component Value Date   WBC 5.5 03/11/2019   HGB 12.8 03/11/2019   HCT 38.5 03/11/2019   PLT 279.0 03/11/2019   GLUCOSE 88 02/02/2020    CHOL 172 02/02/2020   TRIG 167.0 (H) 02/02/2020   HDL 45.80 02/02/2020   LDLDIRECT 117.0 11/09/2019   LDLCALC 93 02/02/2020   ALT 19 02/02/2020   AST 20 02/02/2020   NA 139 02/02/2020   K 4.1 02/02/2020   CL 103 02/02/2020   CREATININE 0.94 02/02/2020   BUN 16 02/02/2020   CO2 29 02/02/2020   TSH 2.29 03/11/2019    MM 3D SCREEN BREAST BILATERAL  Result Date: 06/15/2019 CLINICAL DATA:  Screening. EXAM: DIGITAL SCREENING BILATERAL MAMMOGRAM WITH TOMO AND CAD COMPARISON:  Previous exam(s). ACR Breast Density Category b: There are scattered areas of fibroglandular density. FINDINGS: There are no findings suspicious for malignancy. Images were processed with CAD. IMPRESSION: No mammographic evidence of malignancy. A result letter of this screening mammogram will be mailed directly to the patient. RECOMMENDATION: Screening mammogram in one year. (Code:SM-B-01Y) BI-RADS CATEGORY  1: Negative. Electronically Signed   By: Brianna Carroll M.D.   On: 06/15/2019 12:47     Assessment & Plan:  Plan  I am having Brianna Buba. Carroll maintain her albuterol, Vitamin D, simvastatin, PARoxetine, loratadine, Antara, naproxen, and traZODone.  Meds ordered this encounter  Medications  . traZODone (DESYREL) 50 MG tablet    Sig: Take 1 tablet (50 mg total) by mouth at bedtime as needed for sleep.    Dispense:  90 tablet    Refill:  1    Problem List Items Addressed This Visit      Unprioritized   Hyperlipidemia    Lab Results  Component Value Date   CHOL 172 02/02/2020   HDL 45.80 02/02/2020   LDLCALC 93 02/02/2020   LDLDIRECT 117.0 11/09/2019   TRIG 167.0 (H) 02/02/2020   CHOLHDL 4 02/02/2020   Triglycerides are improved con't meds      Hypertriglyceridemia - Primary   Insomnia    Stable con't trazadone      Relevant Medications   traZODone (DESYREL) 50 MG tablet      Follow-up: Return in about 6 months (around 08/09/2020).  Brianna Held, DO

## 2020-02-07 NOTE — Assessment & Plan Note (Signed)
Lab Results  Component Value Date   CHOL 172 02/02/2020   HDL 45.80 02/02/2020   LDLCALC 93 02/02/2020   LDLDIRECT 117.0 11/09/2019   TRIG 167.0 (H) 02/02/2020   CHOLHDL 4 02/02/2020   Triglycerides are improved con't meds

## 2020-02-07 NOTE — Patient Instructions (Addendum)
The 10-year ASCVD risk score Mikey Bussing DC Brooke Bonito., et al., 2013) is: 2.6%   Values used to calculate the score:     Age: 59 years     Sex: Female     Is Non-Hispanic African American: No     Diabetic: No     Tobacco smoker: No     Systolic Blood Pressure: 347 mmHg     Is BP treated: No     HDL Cholesterol: 45.8 mg/dL     Total Cholesterol: 172 mg/dL        Triglycerides Test Why am I having this test? Triglycerides are a type of fat in the body. Having a high level of triglycerides can increase your risk for heart disease. You may have this test as part of a routine physical exam. Health care providers recommend that adults have this test at least once every 5 years. If you have risk factors for heart disease or are being treated for high triglycerides, you may need to have this test more often. What is being tested? This test measures the amount of triglycerides in your blood. Triglycerides are naturally present in the body, and you also take in triglycerides by eating certain foods. Triglycerides may be measured as part of a test called a lipid profile, which tests triglycerides and cholesterol levels. What kind of sample is taken?     A blood sample is required for this test. It may be collected by inserting a needle into a blood vessel, or by pricking a fingertip with a small needle (finger stick). How do I prepare for this test?  Follow instructions from your health care provider about changing or stopping your regular medicines.  Do not eat or drink anything except water starting 9-12 hours before your test, or as long as told by your health care provider.  Do not drink alcohol starting at least 24 hours before your test.  Follow any instructions from your health care provider about dietary restrictions before your test. Tell a health care provider about:  All medicines you are taking, including vitamins, herbs, eye drops, creams, and over-the-counter medicines.  Any blood  disorders you have.  Any medical conditions you have. How are the results reported? Your test results will be reported as a value that indicates how many triglycerides are in your blood. This will be given as milligrams of triglycerides per deciliter of blood (mg/dL). Your health care provider will compare your results to normal values that were established after testing a large group of people (reference ranges). Reference ranges may vary among labs and hospitals. For this test, common reference ranges are:  Adults: ? Female: 40-160 mg/dL or 0.45-1.81 mmol/L (SI units). ? Female: 35-135 mg/dL or 0.40-1.52 mmol/L (SI units).  Teens 71-75 years old: ? Female: 40-163 mg/dL. ? Female: 40-128 mg/dL.  Children 45-36 years old: ? Female: 36-138 mg/dL. ? Female: 41-138 mg/dL.  Children 75-43 years old: ? Female: 31-108 mg/dL. ? Female: 35-114 mg/dL.  Children 5 years or younger: ? Female: 30-86 mg/dL. ? Female: 32-99 mg/dL. What do the results mean? Results that are within the reference range are considered normal. This means that you have a normal amount of triglycerides in your blood. Results that are higher than your reference range mean that there are too many triglycerides in your blood. This may mean that you:  Have a higher risk of heart disease.  Have certain diseases that cause high triglycerides, such as diabetes.  Are taking certain medicines such  as estrogens and oral contraceptives. Results that are lower than your reference range mean that there are too few triglycerides in your blood. This may mean that you are not getting enough nutrients in your diet (malnutrition). Talk with your health care provider about what your results mean. Questions to ask your health care provider Ask your health care provider, or the department that is doing the test:  When will my results be ready?  How will I get my results?  What are my treatment options?  What other tests do I need?  What  are my next steps? Summary  Triglycerides are a type of fat in the body. Having a high level of triglycerides can increase your risk for heart disease.  You may have this test as part of a routine physical exam. Triglycerides may be measured as part of a test called a lipid profile, which tests triglycerides and cholesterol.  Talk with your health care provider about what your results mean. This information is not intended to replace advice given to you by your health care provider. Make sure you discuss any questions you have with your health care provider. Document Revised: 09/02/2017 Document Reviewed: 03/04/2017 Elsevier Patient Education  [place].

## 2020-02-07 NOTE — Assessment & Plan Note (Signed)
Stable con't trazadone

## 2020-03-15 ENCOUNTER — Encounter: Payer: Self-pay | Admitting: Family Medicine

## 2020-03-27 ENCOUNTER — Other Ambulatory Visit: Payer: Self-pay | Admitting: Family Medicine

## 2020-03-27 DIAGNOSIS — E785 Hyperlipidemia, unspecified: Secondary | ICD-10-CM

## 2020-05-18 ENCOUNTER — Encounter: Payer: BC Managed Care – PPO | Admitting: Family Medicine

## 2020-05-24 ENCOUNTER — Ambulatory Visit: Payer: BC Managed Care – PPO | Admitting: Adult Health

## 2020-05-28 NOTE — Patient Instructions (Addendum)
Allergic rhinitis with a probable nonallergic component Continue loratadine 10 mg once a day Start fluticasone nasal spray 1-2 sprays each nostril once a day as needed for stuffy nose Start azelastine nasal spray using 1-2 sprays each nostril up to two times a day as needed for drainage. Saline nasal rinse or saline nasal spray as needed for nasal symptoms.  Use this prior to any medicated nasal sprays.  Allergic conjunctivitis May use Pataday 1 drop each eye once a day as needed for itchy watery eyes Also may use natural tears, and eye lubricant as needed  Mild intermittent asthma May use albuterol 2 puffs every 4 hours as needed for cough, wheeze, tightness in chest, shortness of breath.  Please let us know if this treatment plan is not working well for you. Schedule a follow-up appointment in 6 months

## 2020-05-29 ENCOUNTER — Ambulatory Visit: Payer: BC Managed Care – PPO | Admitting: Family

## 2020-05-29 ENCOUNTER — Encounter: Payer: Self-pay | Admitting: Family

## 2020-05-29 ENCOUNTER — Other Ambulatory Visit: Payer: Self-pay

## 2020-05-29 DIAGNOSIS — H1013 Acute atopic conjunctivitis, bilateral: Secondary | ICD-10-CM | POA: Diagnosis not present

## 2020-05-29 DIAGNOSIS — J4520 Mild intermittent asthma, uncomplicated: Secondary | ICD-10-CM

## 2020-05-29 DIAGNOSIS — J3089 Other allergic rhinitis: Secondary | ICD-10-CM | POA: Diagnosis not present

## 2020-05-29 NOTE — Progress Notes (Signed)
[facility] [place] [id number] Dept: [phone]  FOLLOW UP NOTE  Patient ID: Brianna Carroll, female    DOB: Jan 20, 1961  Age: 59 y.o. MRN: [id number] Date of Office Visit: 05/29/2020  Assessment  Chief Complaint: Sinusitis and Sinus Problem  HPI Brianna Carroll is a 59 year old female who presents today for follow-up of allergic rhinitis with a probable nonallergic component, allergic conjunctivitis, acute sinusitis, and mild intermittent asthma.  She was last seen by Dr. Verlin Fester on August 17, 2018.  Allergic rhinitis with a probable nonallergic component is reported as not well controlled with loratadine at night.  She reports constant clear rhinorrhea, occasional nasal congestion that is worse at night and denies postnasal drip right now, but reports it was worse this summer.  She also denies sinus tenderness.  She likes taking loratadine for an antihistamine and did not like using XHANCE nasal spray.  She has used a nasal spray in the past that was not [facility] but she is not certain of the name of this nasal spray.  Allergic conjunctivitis is reported as moderately controlled with no medications she denies itchy eyes, but reports watery eyes occasionally.  Mild intermittent asthma is reported as controlled with albuterol as needed.  She denies any coughing, wheezing, tightness in chest, shortness of breath, and nocturnal awakenings.  Since her last office visit she has not required any systemic steroids or made any trips to the emergency room or urgent care due to breathing problems.  She is currently using her albuterol inhaler once a month.     Drug Allergies:  Allergies  Allergen Reactions  . Doxycycline Hives and Rash  . Codeine Nausea And Vomiting  . Hydrocodone-Acetaminophen Nausea And Vomiting  . Hydrocodone-Homatropine Nausea And Vomiting    Review of Systems: Review of Systems  Constitutional: Negative for chills and fever.  HENT:       Reports clear  rhinorrhea and nasal congestion that is worse at night. She denies post nasal drip now, but reports that it was worse this summer.  Eyes:       Reports watery eyes,but denies itchy eyes  Respiratory: Negative for cough, shortness of breath and wheezing.   Cardiovascular: Negative for chest pain and palpitations.  Gastrointestinal: Negative for abdominal pain and heartburn.  Genitourinary: Negative for dysuria.  Skin: Negative for itching and rash.  Neurological: Negative for headaches.  Endo/Heme/Allergies: Positive for environmental allergies.    Physical Exam: BP 122/74 (BP Location: Left Arm, Patient Position: Sitting, Cuff Size: Normal)   Pulse 60   Temp 97.7 F (36.5 C) (Temporal)   Resp 18   Ht 5' (1.524 m)   Wt 162 lb 6.4 oz (73.7 kg)   LMP 09/29/2013 (LMP Unknown)   SpO2 96%   BMI 31.72 kg/m    Physical Exam Constitutional:      Appearance: Normal appearance.  HENT:     Head: Normocephalic and atraumatic.     Comments: Pharynx normal. Eyes normal. Ears normal. Nose: bilateral lower turbinates slightly edematous with clear drainage noted    Right Ear: Tympanic membrane, ear canal and external ear normal.     Left Ear: Tympanic membrane, ear canal and external ear normal.     Mouth/Throat:     Mouth: Mucous membranes are moist.     Pharynx: Oropharynx is clear.  Eyes:     Conjunctiva/sclera: Conjunctivae normal.  Cardiovascular:     Rate and Rhythm: Regular rhythm.     Heart sounds: Normal  heart sounds.  Pulmonary:     Effort: Pulmonary effort is normal.     Breath sounds: Normal breath sounds.     Comments: Lungs clear to auscultation Musculoskeletal:     Cervical back: Neck supple.  Skin:    General: Skin is warm.  Neurological:     Mental Status: She is alert and oriented to person, place, and time.  Psychiatric:        Mood and Affect: Mood normal.        Behavior: Behavior normal.        Thought Content: Thought content normal.        Judgment:  Judgment normal.     Diagnostics: FVC 2.46 L, FEV1 1.96 L.  Predicted FVC 2.81 L, FEV1 2.17 L.  Spirometry indicates normal ventilatory function.  Assessment and Plan: 1. Allergic rhinitis with a probable nonallergic component   2. Mild intermittent asthma, unspecified whether complicated   3. Allergic conjunctivitis of both eyes     No orders of the defined types were placed in this encounter.   Patient Instructions  Allergic rhinitis with a probable nonallergic component Continue loratadine 10 mg once a day Start fluticasone nasal spray 1-2 sprays each nostril once a day as needed for stuffy nose Start azelastine nasal spray using 1-2 sprays each nostril up to two times a day as needed for drainage. Saline nasal rinse or saline nasal spray as needed for nasal symptoms.  Use this prior to any medicated nasal sprays.  Allergic conjunctivitis May use Pataday 1 drop each eye once a day as needed for itchy watery eyes Also may use natural tears, and eye lubricant as needed  Mild intermittent asthma May use albuterol 2 puffs every 4 hours as needed for cough, wheeze, tightness in chest, shortness of breath.  Please let us know if this treatment plan is not working well for you. Schedule a follow-up appointment in 6 months   Return in about 6 months (around 11/27/2020), or if symptoms worsen or fail to improve.    Thank you for the opportunity to care for this patient.  Please do not hesitate to contact me with questions.  Althea Charon, FNP [facility]

## 2020-06-08 ENCOUNTER — Other Ambulatory Visit: Payer: Self-pay | Admitting: *Deleted

## 2020-06-08 ENCOUNTER — Telehealth: Payer: Self-pay | Admitting: Family

## 2020-06-08 MED ORDER — AZELASTINE HCL 0.1 % NA SOLN: NASAL | 12 refills | Status: DC

## 2020-06-08 NOTE — Telephone Encounter (Addendum)
Patient was expecting two nasal sprays to be called into the pharmacy two weeks ago and nothing was called in. Per Chrissie note,   "Start fluticasone nasal spray 1-2 sprays each nostril once a day as needed for stuffy nose Start azelastine nasal spray using 1-2 sprays each nostril up to two times a day as needed for drainage."  Please call into [facility] in [place] on [address].

## 2020-06-08 NOTE — Telephone Encounter (Signed)
Astelin has been sent in. Flonase is not covered and has to be purchased over the counter. [facility] called the patient and advised. Patient verbalized understanding.

## 2020-06-15 ENCOUNTER — Ambulatory Visit
Admission: RE | Admit: 2020-06-15 | Discharge: 2020-06-15 | Disposition: A | Discharge status: Home / Self Care | Payer: BC Managed Care – PPO | Source: Ambulatory Visit | Attending: Obstetrics & Gynecology | Admitting: Obstetrics & Gynecology

## 2020-06-15 ENCOUNTER — Other Ambulatory Visit: Payer: Self-pay

## 2020-06-15 DIAGNOSIS — E2839 Other primary ovarian failure: Secondary | ICD-10-CM

## 2020-06-15 DIAGNOSIS — Z1231 Encounter for screening mammogram for malignant neoplasm of breast: Secondary | ICD-10-CM

## 2020-06-19 ENCOUNTER — Other Ambulatory Visit: Payer: Self-pay | Admitting: Family Medicine

## 2020-06-19 DIAGNOSIS — M545 Low back pain, unspecified: Secondary | ICD-10-CM

## 2020-06-19 DIAGNOSIS — G8929 Other chronic pain: Secondary | ICD-10-CM

## 2020-06-19 DIAGNOSIS — E785 Hyperlipidemia, unspecified: Secondary | ICD-10-CM

## 2020-06-20 ENCOUNTER — Telehealth: Payer: Self-pay | Admitting: Family Medicine

## 2020-06-20 NOTE — Telephone Encounter (Signed)
Try tricor 145 mg instead #30  1 po qd , 2 refills

## 2020-06-20 NOTE — Telephone Encounter (Signed)
That is fine --- is it just another dose that is needed ?

## 2020-06-20 NOTE — Telephone Encounter (Signed)
I'm not sure- I believe her insurance doesn't cover the Antara.

## 2020-06-20 NOTE — Telephone Encounter (Signed)
Robin  From [facility] call stating patient co pay is $1,400 dollars for the Fenofibrate with insurance, pharmacy will like to know can it be change to another form

## 2020-06-20 NOTE — Telephone Encounter (Signed)
Please advise 

## 2020-06-21 MED ORDER — FENOFIBRATE 145 MG PO TABS: 145.0000 mg | ORAL_TABLET | Freq: Every day | ORAL | 2 refills | Status: DC

## 2020-06-21 NOTE — Telephone Encounter (Signed)
Rx sent 

## 2020-06-28 MED ORDER — FLUTICASONE PROPIONATE 50 MCG/ACT NA SUSP: 2.0000 | Freq: Every day | NASAL | 5 refills | Status: DC

## 2020-06-28 NOTE — Telephone Encounter (Signed)
Patient states that fluticasone has been working really well for her and would like to know if it can be called into the [facility] in [place].   Will insurance cover the generic or can a PA be done?  Please advise.

## 2020-06-28 NOTE — Telephone Encounter (Signed)
Called and spoke to Brianna Carroll and informed her that it seemed that her Flonase would not be covered through insurance but I would sent it in if she wanted to see if she wanted to get the medication anyway and see if it was at a price she could afford out of pocket. Brianna Carroll agreed and wanted the medication sent in. I informed Brianna Carroll that the medication could be purchased over the counter and could possible se ordered through [employer] where others have expressed that have had much success with ordering medications though them. Brianna Carroll agreed and was very Patent attorney. I informed Brianna Carroll that if she had any other issues or concerns that she could call our office back and we'd be happy to help her.

## 2020-06-28 NOTE — Addendum Note (Signed)
Addended by: Clovis Cao A on: 06/28/2020 02:41 PM   Modules accepted: Orders

## 2020-09-27 ENCOUNTER — Other Ambulatory Visit: Payer: Self-pay | Admitting: Family Medicine

## 2020-09-27 DIAGNOSIS — G4700 Insomnia, unspecified: Secondary | ICD-10-CM

## 2020-10-02 ENCOUNTER — Other Ambulatory Visit: Payer: Self-pay

## 2020-10-02 ENCOUNTER — Ambulatory Visit: Payer: BC Managed Care – PPO | Admitting: Family Medicine

## 2020-10-02 ENCOUNTER — Encounter: Payer: Self-pay | Admitting: Family Medicine

## 2020-10-02 VITALS — BP 124/80 | HR 54 | Temp 98.3°F | Resp 18 | Ht 60.0 in | Wt 163.0 lb

## 2020-10-02 DIAGNOSIS — E785 Hyperlipidemia, unspecified: Secondary | ICD-10-CM

## 2020-10-02 LAB — COMPREHENSIVE METABOLIC PANEL
ALT: 18 U/L (ref 0–35)
AST: 20 U/L (ref 0–37)
Albumin: 4.5 g/dL (ref 3.5–5.2)
Alkaline Phosphatase: 27 U/L — ABNORMAL LOW (ref 39–117)
BUN: 18 mg/dL (ref 6–23)
CO2: 31 mEq/L (ref 19–32)
Calcium: 9.7 mg/dL (ref 8.4–10.5)
Chloride: 105 mEq/L (ref 96–112)
Creatinine, Ser: 0.95 mg/dL (ref 0.40–1.20)
GFR: 65.19 mL/min (ref >60.00)
Glucose, Bld: 78 mg/dL (ref 70–99)
Potassium: 4.6 mEq/L (ref 3.5–5.1)
Sodium: 141 mEq/L (ref 135–145)
Total Bilirubin: 0.5 mg/dL (ref 0.2–1.2)
Total Protein: 6.8 g/dL (ref 6.0–8.3)

## 2020-10-02 LAB — LIPID PANEL
Cholesterol: 191 mg/dL (ref 0–200)
HDL: 49.1 mg/dL (ref >39.00)
LDL Cholesterol: 116 mg/dL — ABNORMAL HIGH (ref 0–99)
NonHDL: 141.67
Total CHOL/HDL Ratio: 4
Triglycerides: 126 mg/dL (ref 0.0–149.0)
VLDL: 25.2 mg/dL (ref 0.0–40.0)

## 2020-10-02 MED ORDER — FENOFIBRATE 145 MG PO TABS: 145.0000 mg | ORAL_TABLET | Freq: Every day | ORAL | 0 refills | Status: DC

## 2020-10-02 NOTE — Assessment & Plan Note (Addendum)
Encouraged heart healthy diet, increase exercise, avoid trans fats, consider a krill oil cap daily\ Recheck today Consider different statin --- ie crestor 3x a week

## 2020-10-02 NOTE — Patient Instructions (Signed)

## 2020-10-02 NOTE — Progress Notes (Signed)
Patient ID: Brianna Carroll, female    DOB: [birth year]  Age: 60 y.o. MRN: [id number]    Subjective:  Subjective  HPI Brianna Carroll presents for an office visit today. She complains of memory loss and nervousness due to the statin. . She attributes the symptoms to 40 mg of Zocor PO Daily for her Dx of hyperlipidemia. She states that she has not been taking Zocor x2 months. The symptoms resolved when she stopped it.   She also complains of vomiting and gagging, secondary to taking 145 mg of Tricor PO Daily. She states that the medication is too large for her to swallow and has resulted in discomfort, while taking the medication.  The generic co changed.  She denies any chest pain, SOB, fever, abdominal pain, cough, chills, sore throat, dysuria, urinary incontinence, back pain, HA, or N/D at this time. 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  Wt Readings from Last 3 Encounters:  10/02/20 163 lb (73.9 kg)  05/29/20 162 lb 6.4 oz (73.7 kg)  02/07/20 175 lb (79.4 kg)    Review of Systems  Constitutional: Negative for chills, fatigue and fever.  HENT: Negative for ear pain, sinus pressure, sinus pain and sore throat.        (+) gagging secondary to medication   Eyes: Negative for pain.  Respiratory: Negative for cough and shortness of breath.   Cardiovascular: Negative for chest pain, palpitations and leg swelling.  Gastrointestinal: Positive for vomiting (secondary to medicaion). Negative for abdominal pain, blood in stool, constipation, diarrhea and nausea.  Genitourinary: Negative for dysuria, frequency, hematuria and urgency.  Musculoskeletal: Negative for back pain.  Neurological: Negative for dizziness and headaches.  Psychiatric/Behavioral: The patient is nervous/anxious (Secondary to Zocor ).        (+) memory loss secondary to Zocor     History Past Medical History:  Diagnosis Date  . Allergic rhinitis   . Anemia, iron deficiency   . Anxiety   .  Asthma   . Hearing loss   . Heart murmur   . Hyperlipidemia   . Low back pain syndrome   . Mitral valve prolapse   . Sinus headache   . Sleep apnea    wears CPAP  . Snoring     She has a past surgical history that includes Tubal ligation (05/30/94); Cesarean section (11/90, 12/95); Lumbar disc surgery (2004); Reduction mammaplasty (Bilateral, 12/10/2018); and Breast reduction surgery (Bilateral, 12/10/2018).   Her family history includes Allergic rhinitis in her son and son; Asthma in her son; COPD in her sister; Cancer in her brother, father, and mother.She reports that she has never smoked. She has never used smokeless tobacco. She reports current alcohol use of about 1.0 standard drink of alcohol per week. She reports that she does not use drugs.  Current Outpatient Medications on File Prior to Visit  Medication Sig Dispense Refill  . albuterol (PROVENTIL HFA;VENTOLIN HFA) 108 (90 Base) MCG/ACT inhaler Inhale 2 puffs into the lungs every 6 (six) hours as needed for wheezing or shortness of breath. 1 Inhaler 2  . azelastine (ASTELIN) 0.1 % nasal spray 1-2 sprays each nostril up to 2 times daily as needed for drainage. 30 mL 12  . Cholecalciferol (VITAMIN D) 50 MCG (2000 UT) tablet Take 2,000 Units by mouth at bedtime.    . fluticasone (FLONASE) 50 MCG/ACT nasal spray Place 2 sprays into both nostrils daily. 16 g 5  . loratadine (  CLARITIN) 10 MG tablet Take 1 tablet (10 mg total) by mouth daily. 30 tablet 0  . naproxen (NAPROSYN) 500 MG tablet Take 1 tablet (500 mg total) by mouth 2 (two) times daily as needed for moderate pain. Take with food 180 tablet 1  . PARoxetine (PAXIL) 10 MG tablet Take 1 1/2 tabs (15mg ) daily. 135 tablet 3  . traZODone (DESYREL) 50 MG tablet Take 1 tablet (50 mg total) by mouth at bedtime as needed for sleep. 90 tablet 0   No current facility-administered medications on file prior to visit.     Objective:  Objective  Physical Exam Vitals and nursing note  reviewed.  Constitutional:      General: She is not in acute distress.    Appearance: Normal appearance. She is well-developed. She is not ill-appearing.  HENT:     Head: Normocephalic and atraumatic.     Right Ear: External ear normal.     Left Ear: External ear normal.     Nose: Nose normal.  Eyes:     Extraocular Movements: Extraocular movements intact.     Pupils: Pupils are equal, round, and reactive to light.  Cardiovascular:     Rate and Rhythm: Normal rate and regular rhythm.     Pulses: Normal pulses.     Heart sounds: Normal heart sounds. No murmur heard. No friction rub. No gallop.   Pulmonary:     Effort: Pulmonary effort is normal. No respiratory distress.     Breath sounds: Normal breath sounds. No stridor. No wheezing, rhonchi or rales.  Abdominal:     General: Bowel sounds are normal. There is no distension.     Palpations: Abdomen is soft.     Tenderness: There is no abdominal tenderness. There is no guarding or rebound.     Hernia: No hernia is present.  Musculoskeletal:        General: Normal range of motion.     Cervical back: Normal range of motion and neck supple.  Skin:    General: Skin is warm and dry.  Neurological:     Mental Status: She is alert and oriented to person, place, and time.  Psychiatric:        Behavior: Behavior normal.        Thought Content: Thought content normal.    BP 124/80 (BP Location: Left Arm, Patient Position: Sitting, Cuff Size: Normal)   Pulse (!) 54   Temp 98.3 F (36.8 C) (Oral)   Resp 18   Ht 5' (1.524 m)   Wt 163 lb (73.9 kg)   LMP 09/29/2013 (LMP Unknown)   SpO2 99%   BMI 31.83 kg/m  Wt Readings from Last 3 Encounters:  10/02/20 163 lb (73.9 kg)  05/29/20 162 lb 6.4 oz (73.7 kg)  02/07/20 175 lb (79.4 kg)     Lab Results  Component Value Date   WBC 5.5 03/11/2019   HGB 12.8 03/11/2019   HCT 38.5 03/11/2019   PLT 279.0 03/11/2019   GLUCOSE 88 02/02/2020   CHOL 172 02/02/2020   TRIG 167.0 (H)  02/02/2020   HDL 45.80 02/02/2020   LDLDIRECT 117.0 11/09/2019   LDLCALC 93 02/02/2020   ALT 19 02/02/2020   AST 20 02/02/2020   NA 139 02/02/2020   K 4.1 02/02/2020   CL 103 02/02/2020   CREATININE 0.94 02/02/2020   BUN 16 02/02/2020   CO2 29 02/02/2020   TSH 2.29 03/11/2019    DG Bone Density  Result Date: 06/15/2020  EXAM: DUAL X-RAY ABSORPTIOMETRY (DXA) FOR BONE MINERAL DENSITY IMPRESSION: Referring Physician:  Megan Salon Your patient completed a BMD test using Lunar IDXA DXA system ( analysis version: 16 ) manufactured by [employer]. Technologist: AD PATIENT: Name: Charice, Zuno Patient ID: [id number] Birth Date: 1960/12/31 Height: 60.0 in. Sex: Female Measured: 06/15/2020 Weight: 160.0 lbs. Indications: Albuterol, Caucasian, Estrogen Deficient, Paxil, Postmenopausal, Trazodone Fractures: None Treatments: ASSESSMENT: The BMD measured at AP Spine L1-L4 is 1.018 g/cm2 with a T-score of -1.3. This patient is considered osteopenic according to [facility]) criteria. The scan quality is good. Site Region Measured Date Measured Age YA BMD Significant CHANGE T-score AP Spine  L1-L4      06/15/2020    60.0         -1.3    1.018 g/cm2 DualFemur Neck Right 06/15/2020    60.0         -1.0    0.896 g/cm2 DualFemur Total Mean 06/15/2020    60.0         -0.3    0.969 g/cm2 World Health Organization [facility]) criteria for post-menopausal, Caucasian Women: Normal       T-score at or above -1 SD Osteopenia   T-score between -1 and -2.5 SD Osteoporosis T-score at or below -2.5 SD RECOMMENDATION: 1. All patients should optimize calcium and vitamin D intake. 2. Consider FDA approved medical therapies in postmenopausal women and men aged 83 years and older, based on the following: a. A hip or vertebral (clinical or morphometric) fracture b. T- score < or = -2.5 at the femoral neck or spine after appropriate evaluation to exclude secondary causes c. Low bone mass (T-score between -1.0 and -2.5  at the femoral neck or spine) and a 10 year probability of a hip fracture > or = 3% or a 10 year probability of a major osteoporosis-related fracture > or = 20% based on the [place]-adapted WHO algorithm d. Clinician judgment and/or patient preferences may indicate treatment for people with 10-year fracture probabilities above or below these levels FOLLOW-UP: Patients with diagnosis of osteoporosis or at high risk for fracture should have regular bone mineral density tests. For patients eligible for Medicare, routine testing is allowed once every 2 years. The testing frequency can be increased to one year for patients who have rapidly progressing disease, those who are receiving or discontinuing medical therapy to restore bone mass, or have additional risk factors. I have reviewed this report and agree with the above findings. [facility] FRAX* 10-year Probability of Fracture Based on femoral neck BMD: DualFemur (Right) Major Osteoporotic Fracture: 6.8% Hip Fracture:                0.4% Population:                  [place] (Caucasian) Risk Factors:                None *FRAX is a Materials engineer of the [employer] for Metabolic Bone Disease, a World Pharmacologist (WHO) [facility]. ASSESSMENT: The probability of a major osteoporotic fracture is 6.8% within the next ten years. The probability of a hip fracture is 0.4% within the next ten years. Electronically Signed   By: Rolm Baptise M.D.   On: 06/15/2020 10:44   MM 3D SCREEN BREAST BILATERAL  Result Date: 06/15/2020 CLINICAL DATA:  Screening. EXAM: DIGITAL SCREENING BILATERAL MAMMOGRAM WITH TOMO AND CAD COMPARISON:  Previous exam(s). ACR Breast Density Category  b: There are scattered areas of fibroglandular density. FINDINGS: There are no findings suspicious for malignancy. Post reduction changes. Images were processed with CAD. IMPRESSION: No mammographic evidence of malignancy. A result letter of this screening  mammogram will be mailed directly to the patient. RECOMMENDATION: Screening mammogram in one year. (Code:SM-B-01Y) BI-RADS CATEGORY  2: Benign. Electronically Signed   By: Valentino Saxon MD   On: 06/15/2020 10:33     Assessment & Plan:  Plan    Meds ordered this encounter  Medications  . fenofibrate (TRICOR) 145 MG tablet    Sig: Take 1 tablet (145 mg total) by mouth daily.    Dispense:  90 tablet    Refill:  0    Problem List Items Addressed This Visit      Unprioritized   Hyperlipidemia - Primary    Encouraged heart healthy diet, increase exercise, avoid trans fats, consider a krill oil cap daily\ Recheck today Consider different statin --- ie crestor 3x a week       Relevant Medications   fenofibrate (TRICOR) 145 MG tablet   Other Relevant Orders   Lipid panel   Comprehensive metabolic panel     Follow-up: Return in about 6 months (around 04/03/2021), or if symptoms worsen or fail to improve, for annual exam, fasting.   I,Gordon Zheng,acting as a Education administrator for [employer], DO.,have documented all relevant documentation on the behalf of Ann Held, DO,as directed by  Ann Held, DO while in the presence of [place], DO, have reviewed all documentation for this visit. The documentation on 10/02/20 for the exam, diagnosis, procedures, and orders are all accurate and complete. Ann Held, DO

## 2020-11-24 ENCOUNTER — Ambulatory Visit: Payer: BC Managed Care – PPO

## 2020-11-27 ENCOUNTER — Ambulatory Visit: Payer: Self-pay | Admitting: Family

## 2020-12-15 ENCOUNTER — Ambulatory Visit (HOSPITAL_BASED_OUTPATIENT_CLINIC_OR_DEPARTMENT_OTHER): Payer: BC Managed Care – PPO | Admitting: Obstetrics & Gynecology

## 2020-12-19 ENCOUNTER — Ambulatory Visit: Payer: BC Managed Care – PPO | Admitting: Family Medicine

## 2020-12-19 ENCOUNTER — Other Ambulatory Visit: Payer: Self-pay

## 2020-12-19 ENCOUNTER — Encounter: Payer: Self-pay | Admitting: Family Medicine

## 2020-12-19 VITALS — BP 123/59 | HR 81 | Temp 98.6°F | Resp 16 | Wt 164.0 lb

## 2020-12-19 DIAGNOSIS — R1032 Left lower quadrant pain: Secondary | ICD-10-CM | POA: Diagnosis not present

## 2020-12-19 DIAGNOSIS — R109 Unspecified abdominal pain: Secondary | ICD-10-CM

## 2020-12-19 DIAGNOSIS — R112 Nausea with vomiting, unspecified: Secondary | ICD-10-CM

## 2020-12-19 LAB — POC URINALSYSI DIPSTICK (AUTOMATED)
Bilirubin, UA: NEGATIVE
Blood, UA: POSITIVE
Glucose, UA: NEGATIVE
Ketones, UA: NEGATIVE
Leukocytes, UA: NEGATIVE
Nitrite, UA: NEGATIVE
Protein, UA: POSITIVE — AB
Spec Grav, UA: 1.015 (ref 1.010–1.025)
Urobilinogen, UA: 0.2 E.U./dL
pH, UA: 7 (ref 5.0–8.0)

## 2020-12-19 MED ORDER — CEFTRIAXONE SODIUM 1 G IJ SOLR
1.0000 g | Freq: Once | INTRAMUSCULAR | Status: AC
  Administered 2020-12-19: 19:00:00 1 g via INTRAMUSCULAR

## 2020-12-19 MED ORDER — ONDANSETRON HCL 4 MG PO TABS: 4.0000 mg | ORAL_TABLET | Freq: Three times a day (TID) | ORAL | 0 refills | Status: DC | PRN

## 2020-12-19 MED ORDER — CIPROFLOXACIN HCL 500 MG PO TABS: 500.0000 mg | ORAL_TABLET | Freq: Two times a day (BID) | ORAL | 0 refills | Status: AC

## 2020-12-19 NOTE — Patient Instructions (Signed)

## 2020-12-19 NOTE — Assessment & Plan Note (Addendum)
?   Diverticulitis-- pt has history  + blood in urine  Rocephin 1 gm cipro sent to pharmacy with zofran Ct abd--- unable to get tonight --- pt to go to er if pain worsens overnight She really wants to try to go home and wait it out -- she was encouraged to go to er if pain worsens

## 2020-12-19 NOTE — Addendum Note (Signed)
Addended by: Roma Schanz R on: 12/19/2020 09:03 PM   Modules accepted: Orders

## 2020-12-19 NOTE — Progress Notes (Signed)
Established Patient Office Visit  Subjective:  Patient ID: Brianna Carroll, female    DOB: 1961/01/18  Age: 60 y.o. MRN: [id number]  CC:  Chief Complaint  Patient presents with   Abdominal Pain    Complains of having severe low abdominal pain, on the left side since yesterday. Vomiting last night and this morning.     HPI DANAYSIA RADER presents for abd pain , nv since yesterday.    Past Medical History:  Diagnosis Date   Allergic rhinitis    Anemia, iron deficiency    Anxiety    Asthma    Hearing loss    Heart murmur    Hyperlipidemia    Low back pain syndrome    Mitral valve prolapse    Sinus headache    Sleep apnea    wears CPAP   Snoring     Past Surgical History:  Procedure Laterality Date   BREAST REDUCTION SURGERY Bilateral 12/10/2018   Procedure: BILATERAL MAMMARY REDUCTION  (BREAST);  Surgeon: Crissie Reese, MD;  Location: [place];  Service: Plastics;  Laterality: Bilateral;   CESAREAN SECTION  11/90, 12/95   LUMBAR DISC SURGERY  2004   REDUCTION MAMMAPLASTY Bilateral 12/10/2018   TUBAL LIGATION  05/30/94    Family History  Problem Relation Age of Onset   Cancer Father        Lung    Cancer Mother        Lung Cancer   Cancer Brother        throat cancer-heavy smoker   COPD Sister    Asthma Son    Allergic rhinitis Son    Allergic rhinitis Son     Social History   Socioeconomic History   Marital status: Married    Spouse name: [name]   Number of children: 2   Years of education: Not on file   Highest education level: Not on file  Occupational History   Occupation: Product/process development scientist: [employer]    Comment: retired   Tobacco Use   Smoking status: Never   Smokeless tobacco: Never  Vaping Use   Vaping Use: Never used  Substance and Sexual Activity   Alcohol use: Yes    Alcohol/week: 1.0 standard drink    Types: 1 Standard drinks or equivalent per week   Drug use: No   Sexual activity: Yes    Partners: Male     Birth control/protection: Surgical    Comment: BTL  Other Topics Concern   Not on file  Social History Narrative   Not on file   Social Determinants of Health   Financial Resource Strain: Not on file  Food Insecurity: Not on file  Transportation Needs: Not on file  Physical Activity: Not on file  Stress: Not on file  Social Connections: Not on file  Intimate Partner Violence: Not on file    Outpatient Medications Prior to Visit  Medication Sig Dispense Refill   albuterol (PROVENTIL HFA;VENTOLIN HFA) 108 (90 Base) MCG/ACT inhaler Inhale 2 puffs into the lungs every 6 (six) hours as needed for wheezing or shortness of breath. 1 Inhaler 2   azelastine (ASTELIN) 0.1 % nasal spray 1-2 sprays each nostril up to 2 times daily as needed for drainage. 30 mL 12   Cholecalciferol (VITAMIN D) 50 MCG (2000 UT) tablet Take 2,000 Units by mouth at bedtime.     fenofibrate (TRICOR) 145 MG tablet Take 1 tablet (145 mg total) by mouth daily.  90 tablet 0   fluticasone (FLONASE) 50 MCG/ACT nasal spray Place 2 sprays into both nostrils daily. 16 g 5   loratadine (CLARITIN) 10 MG tablet Take 1 tablet (10 mg total) by mouth daily. 30 tablet 0   naproxen (NAPROSYN) 500 MG tablet Take 1 tablet (500 mg total) by mouth 2 (two) times daily as needed for moderate pain. Take with food 180 tablet 1   PARoxetine (PAXIL) 10 MG tablet Take 1 1/2 tabs (15mg ) daily. 135 tablet 3   traZODone (DESYREL) 50 MG tablet Take 1 tablet (50 mg total) by mouth at bedtime as needed for sleep. 90 tablet 0   No facility-administered medications prior to visit.    Allergies  Allergen Reactions   Doxycycline Hives and Rash   Codeine Nausea And Vomiting   Hydrocodone Bit-Homatrop Mbr Nausea And Vomiting   Hydrocodone-Acetaminophen Nausea And Vomiting    ROS Review of Systems  Constitutional:  Positive for appetite change. Negative for chills, diaphoresis, fatigue, fever and unexpected weight change.  Eyes:  Negative for  pain, redness and visual disturbance.  Respiratory:  Negative for cough, chest tightness, shortness of breath and wheezing.   Cardiovascular:  Negative for chest pain, palpitations and leg swelling.  Gastrointestinal:  Positive for abdominal pain, nausea and vomiting. Negative for blood in stool and diarrhea.  Endocrine: Negative for cold intolerance, heat intolerance, polydipsia, polyphagia and polyuria.  Genitourinary:  Negative for difficulty urinating, dysuria and frequency.  Neurological:  Negative for dizziness, light-headedness, numbness and headaches.     Objective:    Physical Exam Vitals and nursing note reviewed.  Constitutional:      Appearance: She is well-developed.  HENT:     Head: Normocephalic and atraumatic.  Eyes:     Conjunctiva/sclera: Conjunctivae normal.  Neck:     Thyroid: No thyromegaly.     Vascular: No carotid bruit or JVD.  Cardiovascular:     Rate and Rhythm: Normal rate and regular rhythm.     Heart sounds: Normal heart sounds. No murmur heard. Pulmonary:     Effort: Pulmonary effort is normal. No respiratory distress.     Breath sounds: Normal breath sounds. No wheezing or rales.  Chest:     Chest wall: No tenderness.  Abdominal:     Tenderness: There is abdominal tenderness in the left lower quadrant. There is guarding. There is no right CVA tenderness, left CVA tenderness or rebound.  Musculoskeletal:     Cervical back: Normal range of motion and neck supple.  Neurological:     Mental Status: She is alert and oriented to person, place, and time.   BP (!) 123/59 (BP Location: Right Arm, Patient Position: Sitting, Cuff Size: Small)   Pulse 81   Temp 98.6 F (37 C) (Oral)   Resp 16   Wt 164 lb (74.4 kg)   LMP 09/29/2013 (LMP Unknown)   SpO2 100%   BMI 32.03 kg/m  Wt Readings from Last 3 Encounters:  12/19/20 164 lb (74.4 kg)  10/02/20 163 lb (73.9 kg)  05/29/20 162 lb 6.4 oz (73.7 kg)     Health Maintenance Due  Topic Date Due    Pneumococcal Vaccine 38-74 Years old (1 - PCV) Never done   COVID-19 Vaccine (3 - Pfizer risk series) 10/20/2019   PAP SMEAR-Modifier  04/25/2020    There are no preventive care reminders to display for this patient.  Lab Results  Component Value Date   TSH 2.29 03/11/2019   Lab Results  Component Value Date   WBC 5.5 03/11/2019   HGB 12.8 03/11/2019   HCT 38.5 03/11/2019   MCV 93.0 03/11/2019   PLT 279.0 03/11/2019   Lab Results  Component Value Date   NA 141 10/02/2020   K 4.6 10/02/2020   CO2 31 10/02/2020   GLUCOSE 78 10/02/2020   BUN 18 10/02/2020   CREATININE 0.95 10/02/2020   BILITOT 0.5 10/02/2020   ALKPHOS 27 (L) 10/02/2020   AST 20 10/02/2020   ALT 18 10/02/2020   PROT 6.8 10/02/2020   ALBUMIN 4.5 10/02/2020   CALCIUM 9.7 10/02/2020   ANIONGAP 9 12/07/2018   GFR 65.19 10/02/2020   Lab Results  Component Value Date   CHOL 191 10/02/2020   Lab Results  Component Value Date   HDL 49.10 10/02/2020   Lab Results  Component Value Date   LDLCALC 116 (H) 10/02/2020   Lab Results  Component Value Date   TRIG 126.0 10/02/2020   Lab Results  Component Value Date   CHOLHDL 4 10/02/2020   No results found for: HGBA1C    Assessment & Plan:   Problem List Items Addressed This Visit       Unprioritized   Left lower quadrant abdominal pain    ? Diverticulitis-- pt has history  + blood in urine  Rocephin 1 gm cipro sent to pharmacy with zofran Ct abd--- unable to get tonight --- pt to go to er if pain worsens overnight She really wants to try to go home and wait it out -- she was encouraged to go to er if pain worsens        Relevant Medications   ciprofloxacin (CIPRO) 500 MG tablet   Other Relevant Orders   CT Abdomen Pelvis W Contrast   Urine Culture   Other Visit Diagnoses     Abdominal pain, unspecified abdominal location    -  Primary   Relevant Medications   cefTRIAXone (ROCEPHIN) injection 1 g (Completed)   Other Relevant Orders    CBC with Differential/Platelet   Comprehensive metabolic panel   POCT Urinalysis Dipstick (Automated) (Completed)   Nausea and vomiting, intractability of vomiting not specified, unspecified vomiting type       Relevant Medications   ondansetron (ZOFRAN) 4 MG tablet   Other Relevant Orders   CT Abdomen Pelvis W Contrast       Meds ordered this encounter  Medications   ciprofloxacin (CIPRO) 500 MG tablet    Sig: Take 1 tablet (500 mg total) by mouth 2 (two) times daily for 10 days.    Dispense:  20 tablet    Refill:  0   ondansetron (ZOFRAN) 4 MG tablet    Sig: Take 1 tablet (4 mg total) by mouth every 8 (eight) hours as needed for nausea or vomiting.    Dispense:  20 tablet    Refill:  0   cefTRIAXone (ROCEPHIN) injection 1 g    Follow-up: Return if symptoms worsen or fail to improve.    Ann Held, DO

## 2020-12-20 LAB — CBC WITH DIFFERENTIAL/PLATELET
Basophils Absolute: 0.1 10*3/uL (ref 0.0–0.1)
Basophils Relative: 0.5 % (ref 0.0–3.0)
Eosinophils Absolute: 0.2 10*3/uL (ref 0.0–0.7)
Eosinophils Relative: 1.4 % (ref 0.0–5.0)
HCT: 37 % (ref 36.0–46.0)
Hemoglobin: 12.5 g/dL (ref 12.0–15.0)
Lymphocytes Relative: 13.3 % (ref 12.0–46.0)
Lymphs Abs: 1.5 10*3/uL (ref 0.7–4.0)
MCHC: 33.8 g/dL (ref 30.0–36.0)
MCV: 92.6 fl (ref 78.0–100.0)
Monocytes Absolute: 0.8 10*3/uL (ref 0.1–1.0)
Monocytes Relative: 7 % (ref 3.0–12.0)
Neutro Abs: 8.9 10*3/uL — ABNORMAL HIGH (ref 1.4–7.7)
Neutrophils Relative %: 77.8 % — ABNORMAL HIGH (ref 43.0–77.0)
Platelets: 395 10*3/uL (ref 150.0–400.0)
RBC: 3.99 Mil/uL (ref 3.87–5.11)
RDW: 13.1 % (ref 11.5–15.5)
WBC: 11.4 10*3/uL — ABNORMAL HIGH (ref 4.0–10.5)

## 2020-12-20 LAB — COMPREHENSIVE METABOLIC PANEL
ALT: 27 U/L (ref 0–35)
AST: 31 U/L (ref 0–37)
Albumin: 4.5 g/dL (ref 3.5–5.2)
Alkaline Phosphatase: 39 U/L (ref 39–117)
BUN: 16 mg/dL (ref 6–23)
CO2: 28 mEq/L (ref 19–32)
Calcium: 9.8 mg/dL (ref 8.4–10.5)
Chloride: 102 mEq/L (ref 96–112)
Creatinine, Ser: 0.91 mg/dL (ref 0.40–1.20)
GFR: 68.54 mL/min (ref >60.00)
Glucose, Bld: 103 mg/dL — ABNORMAL HIGH (ref 70–99)
Potassium: 4.1 mEq/L (ref 3.5–5.1)
Sodium: 139 mEq/L (ref 135–145)
Total Bilirubin: 0.4 mg/dL (ref 0.2–1.2)
Total Protein: 7.9 g/dL (ref 6.0–8.3)

## 2020-12-20 LAB — URINE CULTURE
MICRO NUMBER:: 12081700
SPECIMEN QUALITY:: ADEQUATE

## 2020-12-21 ENCOUNTER — Encounter: Payer: Self-pay | Admitting: Family Medicine

## 2020-12-22 ENCOUNTER — Other Ambulatory Visit: Payer: Self-pay | Admitting: Family Medicine

## 2020-12-22 DIAGNOSIS — E785 Hyperlipidemia, unspecified: Secondary | ICD-10-CM

## 2020-12-22 DIAGNOSIS — G4700 Insomnia, unspecified: Secondary | ICD-10-CM

## 2021-02-05 ENCOUNTER — Ambulatory Visit (INDEPENDENT_AMBULATORY_CARE_PROVIDER_SITE_OTHER): Payer: BC Managed Care – PPO | Admitting: Obstetrics & Gynecology

## 2021-02-05 ENCOUNTER — Other Ambulatory Visit (HOSPITAL_COMMUNITY)
Admission: RE | Admit: 2021-02-05 | Discharge: 2021-02-05 | Disposition: A | Discharge status: Home / Self Care | Payer: BC Managed Care – PPO | Source: Ambulatory Visit | Attending: Obstetrics & Gynecology | Admitting: Obstetrics & Gynecology

## 2021-02-05 ENCOUNTER — Encounter (HOSPITAL_BASED_OUTPATIENT_CLINIC_OR_DEPARTMENT_OTHER): Payer: Self-pay | Admitting: Obstetrics & Gynecology

## 2021-02-05 ENCOUNTER — Other Ambulatory Visit: Payer: Self-pay

## 2021-02-05 VITALS — BP 134/62 | HR 61 | Ht 61.0 in | Wt 166.4 lb

## 2021-02-05 DIAGNOSIS — Z9889 Other specified postprocedural states: Secondary | ICD-10-CM

## 2021-02-05 DIAGNOSIS — G4700 Insomnia, unspecified: Secondary | ICD-10-CM

## 2021-02-05 DIAGNOSIS — Z124 Encounter for screening for malignant neoplasm of cervix: Secondary | ICD-10-CM | POA: Diagnosis not present

## 2021-02-05 DIAGNOSIS — Z780 Asymptomatic menopausal state: Secondary | ICD-10-CM | POA: Diagnosis not present

## 2021-02-05 DIAGNOSIS — Z01419 Encounter for gynecological examination (general) (routine) without abnormal findings: Secondary | ICD-10-CM

## 2021-02-05 DIAGNOSIS — F411 Generalized anxiety disorder: Secondary | ICD-10-CM

## 2021-02-05 DIAGNOSIS — E785 Hyperlipidemia, unspecified: Secondary | ICD-10-CM

## 2021-02-05 MED ORDER — TRAZODONE HCL 50 MG PO TABS
50.0000 mg | ORAL_TABLET | Freq: Every evening | ORAL | 3 refills | Status: DC | PRN
Start: 2021-02-05 — End: 2022-03-13

## 2021-02-05 MED ORDER — PAROXETINE HCL 10 MG PO TABS: ORAL_TABLET | ORAL | 3 refills | Status: DC

## 2021-02-05 NOTE — Progress Notes (Signed)
60 y.o. G2P2 Married White or Caucasian female here for annual exam. Having some increased hot flashes.  This is better than it used to be.  Uses trazodone for sleep.  This has really helped.  So pleased with breast reduction.  Feeling really good.  Patient's last menstrual period was 09/29/2013 (lmp unknown).          Sexually active: Yes.    The current method of family planning is tubal ligation.    Exercising: Yes.     walking Smoker:  no  Health Maintenance: Pap:  04/25/2017 negative History of abnormal Pap:  no MMG:  06/15/2020 Normal Colonoscopy:  06/07/2016, follow up 10 years BMD:   06/15/2020 TDaP:  2015 Shingrix:   2020 Hep C testing: 03/2016 Screening Labs: Dr. Etter Sjogren ordered   reports that she has never smoked. She has never used smokeless tobacco. She reports current alcohol use of about 1.0 standard drink per week. She reports that she does not use drugs.  Past Medical History:  Diagnosis Date   Allergic rhinitis    Anemia, iron deficiency    Anxiety    Asthma    Hearing loss    Heart murmur    Hyperlipidemia    Low back pain syndrome    Mitral valve prolapse    Sinus headache    Sleep apnea    wears CPAP   Snoring     Past Surgical History:  Procedure Laterality Date   BREAST REDUCTION SURGERY Bilateral 12/10/2018   Procedure: BILATERAL MAMMARY REDUCTION  (BREAST);  Surgeon: Crissie Reese, MD;  Location: [facility];  Service: Plastics;  Laterality: Bilateral;   CESAREAN SECTION  11/90, 12/95   LUMBAR DISC SURGERY  2004   REDUCTION MAMMAPLASTY Bilateral 12/10/2018   TUBAL LIGATION  05/30/94    Current Outpatient Medications  Medication Sig Dispense Refill   albuterol (PROVENTIL HFA;VENTOLIN HFA) 108 (90 Base) MCG/ACT inhaler Inhale 2 puffs into the lungs every 6 (six) hours as needed for wheezing or shortness of breath. 1 Inhaler 2   azelastine (ASTELIN) 0.1 % nasal spray 1-2 sprays each nostril up to 2 times daily as needed for drainage. 30 mL 12    Cholecalciferol (VITAMIN D) 50 MCG (2000 UT) tablet Take 2,000 Units by mouth at bedtime.     fenofibrate (TRICOR) 145 MG tablet Take 1 tablet by mouth once daily 90 tablet 0   fluticasone (FLONASE) 50 MCG/ACT nasal spray Place 2 sprays into both nostrils daily. 16 g 5   loratadine (CLARITIN) 10 MG tablet Take 1 tablet (10 mg total) by mouth daily. 30 tablet 0   naproxen (NAPROSYN) 500 MG tablet Take 1 tablet (500 mg total) by mouth 2 (two) times daily as needed for moderate pain. Take with food 180 tablet 1   traZODone (DESYREL) 50 MG tablet TAKE 1 TABLET BY MOUTH AT BEDTIME AS NEEDED FOR SLEEP 90 tablet 0   ondansetron (ZOFRAN) 4 MG tablet Take 1 tablet (4 mg total) by mouth every 8 (eight) hours as needed for nausea or vomiting. (Patient not taking: Reported on 02/05/2021) 20 tablet 0   No current facility-administered medications for this visit.    Family History  Problem Relation Age of Onset   Cancer Father        Lung    Cancer Mother        Lung Cancer   Cancer Brother        throat cancer-heavy smoker   COPD Sister  Asthma Son    Allergic rhinitis Son    Allergic rhinitis Son     Review of Systems  All other systems reviewed and are negative.  Exam:   BP 134/62 (BP Location: Right Arm, Patient Position: Sitting, Cuff Size: Large)   Pulse 61   Ht '5\' 1"'$  (1.549 m)   Wt 166 lb 6.4 oz (75.5 kg)   LMP 09/29/2013 (LMP Unknown)   BMI 31.44 kg/m   Height: '5\' 1"'$  (154.9 cm)  General appearance: alert, cooperative and appears stated age Head: Normocephalic, without obvious abnormality, atraumatic Neck: no adenopathy, supple, symmetrical, trachea midline and thyroid normal to inspection and palpation Lungs: clear to auscultation bilaterally Breasts: normal appearance, no masses or tenderness, well healed scars present Heart: regular rate and rhythm Abdomen: soft, non-tender; bowel sounds normal; no masses,  no organomegaly Extremities: extremities normal, atraumatic, no  cyanosis or edema Skin: Skin color, texture, turgor normal. No rashes or lesions Lymph nodes: Cervical, supraclavicular, and axillary nodes normal. No abnormal inguinal nodes palpated Neurologic: Grossly normal  Pelvic: External genitalia:  no lesions              Urethra:  normal appearing urethra with no masses, tenderness or lesions              Bartholins and Skenes: normal                 Vagina: normal appearing vagina with normal color and no discharge, no lesions              Cervix: no lesions              Pap taken: Yes.   Bimanual Exam:  Uterus:  normal size, contour, position, consistency, mobility, non-tender              Adnexa: normal adnexa and no mass, fullness, tenderness               Rectovaginal: Confirms               Anus:  normal sphincter tone, no lesions  Chaperone, Octaviano Batty, CMA, was present for exam.  Assessment/Plan: 1. Well woman exam with routine gynecological exam - pap smear obtained - MMG 05/2020 - BMD 05/2020 - colonoscopy 05/2016, follow up 10 years - Dr Etter Sjogren has ordered blood work - vaccines reviewed/updated  2. Postmenopausal - no HRT  3. Insomnia, unspecified type - traZODone (DESYREL) 50 MG tablet; Take 1 tablet (50 mg total) by mouth at bedtime as needed. for sleep  Dispense: 90 tablet; Refill: 3  4. Elevated lipids  5. History of bilateral breast reduction surgery  6. Generalized anxiety disorder - PARoxetine (PAXIL) 10 MG tablet; Take 1 1/2 tabs ('15mg'$ ) daily.  Dispense: 135 tablet; Refill: 3

## 2021-02-08 LAB — CYTOLOGY - PAP
Comment: NEGATIVE
Diagnosis: NEGATIVE
High risk HPV: NEGATIVE

## 2021-03-09 ENCOUNTER — Encounter: Payer: Self-pay | Admitting: Family Medicine

## 2021-03-19 ENCOUNTER — Other Ambulatory Visit: Payer: Self-pay | Admitting: Family Medicine

## 2021-03-19 DIAGNOSIS — E785 Hyperlipidemia, unspecified: Secondary | ICD-10-CM

## 2021-03-27 ENCOUNTER — Telehealth: Payer: Self-pay | Admitting: Family Medicine

## 2021-03-27 NOTE — Telephone Encounter (Signed)
Medication: albuterol (PROVENTIL HFA;VENTOLIN HFA) 108 (90 Base) MCG/ACT inhaler  Has the patient contacted their pharmacy? Yes.   (If no, request that the patient contact the pharmacy for the refill.) (If yes, when and what did the pharmacy advise?)  Patient called pharmacy and they stated they did not have medication on file for refill.   Preferred Pharmacy (with phone number or street name): [facility] [address], [place], [place] [address]  Phone:  [phone]  Fax:  [phone]  Agent: Please be advised that RX refills may take up to 3 business days. We ask that you follow-up with your pharmacy.

## 2021-03-27 NOTE — Telephone Encounter (Signed)
Okay to refill? Rx has not been refilled since 2020 and by a different provider. Pt last seen in July 2022

## 2021-03-28 MED ORDER — ALBUTEROL SULFATE HFA 108 (90 BASE) MCG/ACT IN AERS: 2.0000 | INHALATION_SPRAY | Freq: Four times a day (QID) | RESPIRATORY_TRACT | 3 refills | Status: DC | PRN

## 2021-03-28 NOTE — Telephone Encounter (Signed)
Refill sent.

## 2021-04-02 NOTE — Telephone Encounter (Signed)
Pt would like to know if another one can be sent so she can have one for her house and one for her travel. Please advise

## 2021-04-03 ENCOUNTER — Other Ambulatory Visit: Payer: Self-pay

## 2021-04-03 MED ORDER — ALBUTEROL SULFATE HFA 108 (90 BASE) MCG/ACT IN AERS: 2.0000 | INHALATION_SPRAY | Freq: Four times a day (QID) | RESPIRATORY_TRACT | 3 refills | Status: DC | PRN

## 2021-04-03 NOTE — Telephone Encounter (Signed)
Rx sent 

## 2021-05-24 IMAGING — MG DIGITAL SCREENING BILAT W/ TOMO W/ CAD
8 series · 8 of 24 positions shown · non-contrast
Comparison: Previous exam(s).

CLINICAL DATA: Screening.

EXAM:
DIGITAL SCREENING BILATERAL MAMMOGRAM WITH TOMO AND CAD

[R CC synth-2D]
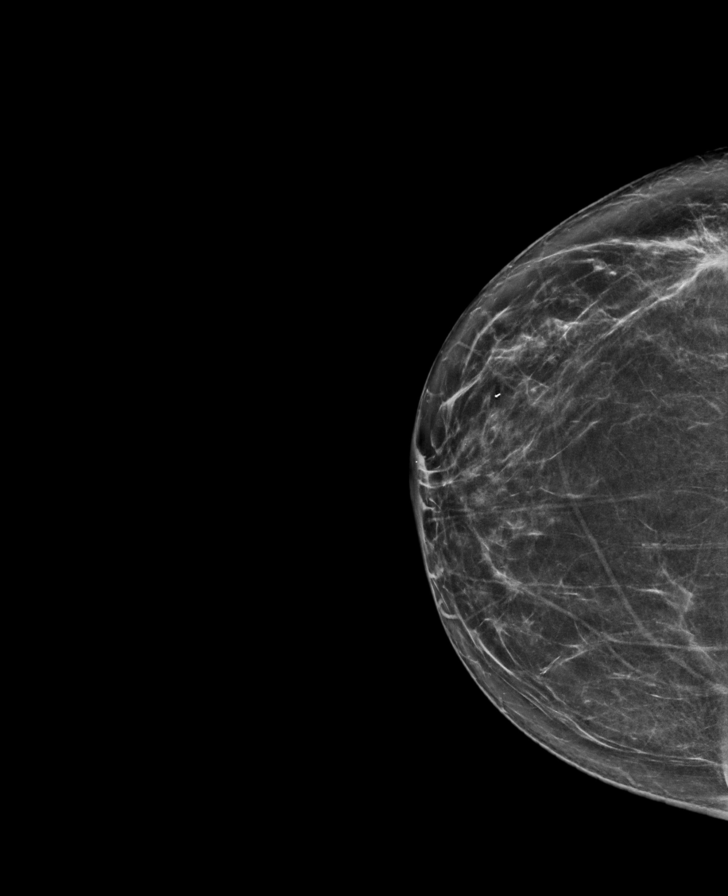

[L CC synth-2D]
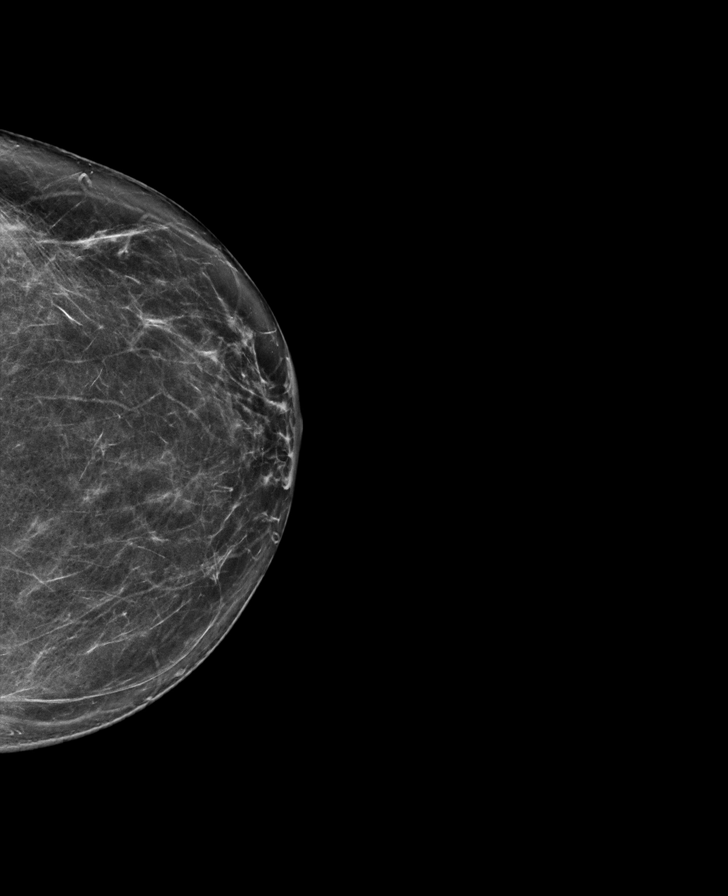

[L MLO synth-2D]
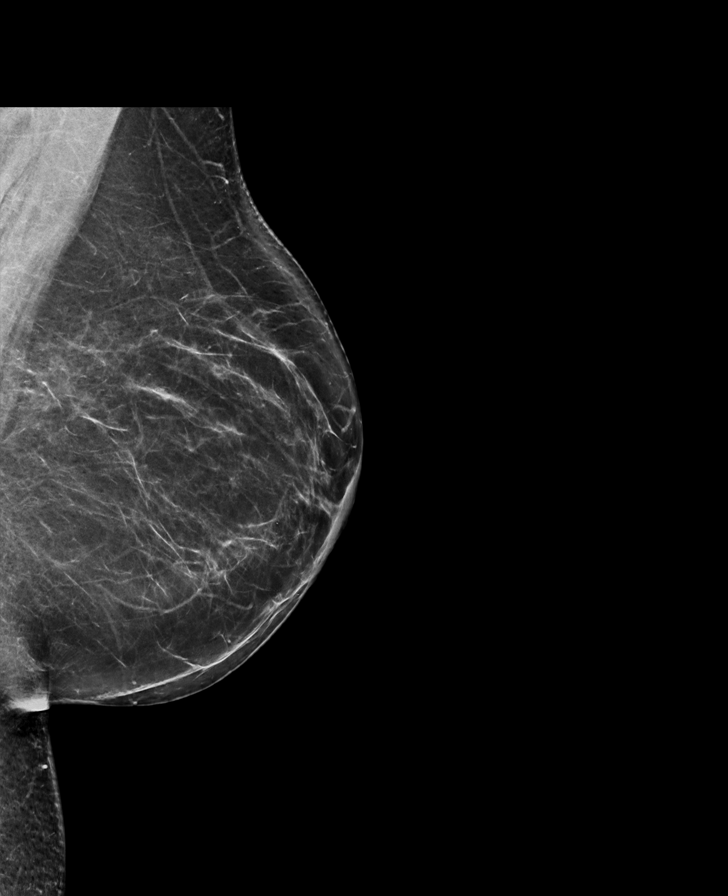

[R MLO synth-2D]
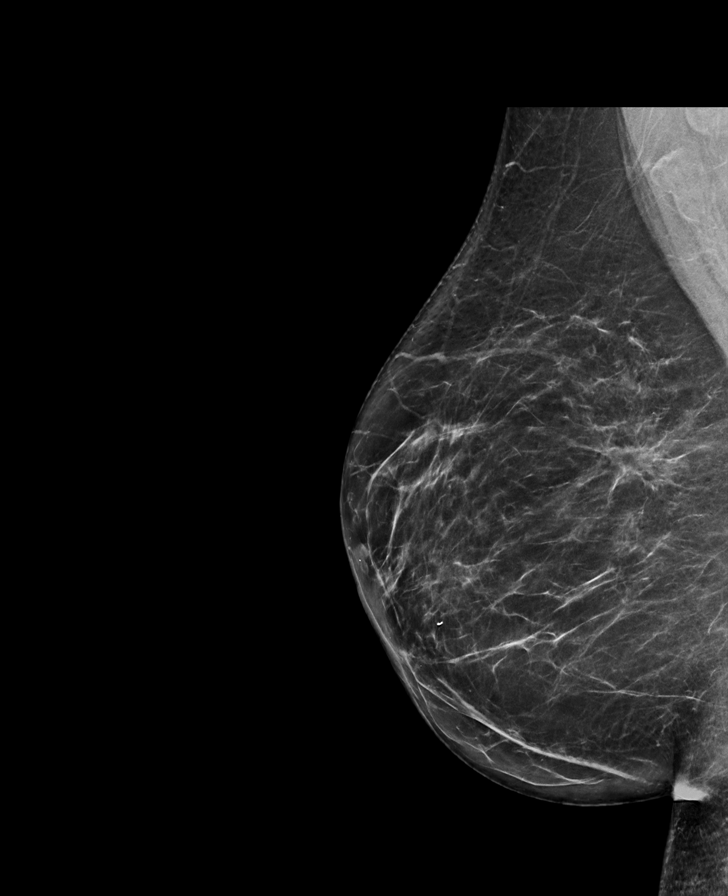

[R MLO tomo · tomo slice 41/80.0]
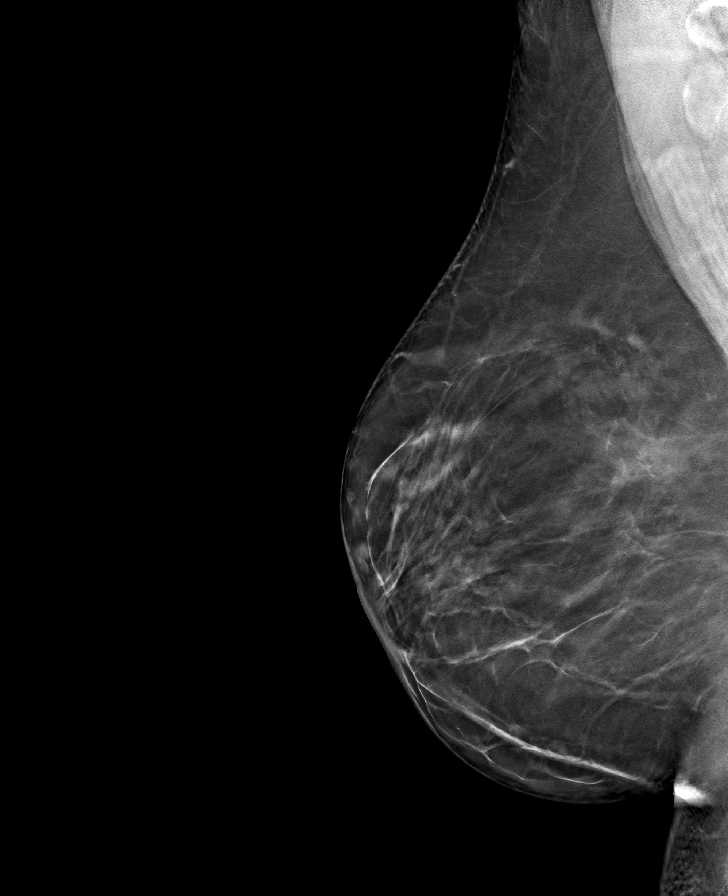

[L CC tomo · tomo slice 35/69.0]
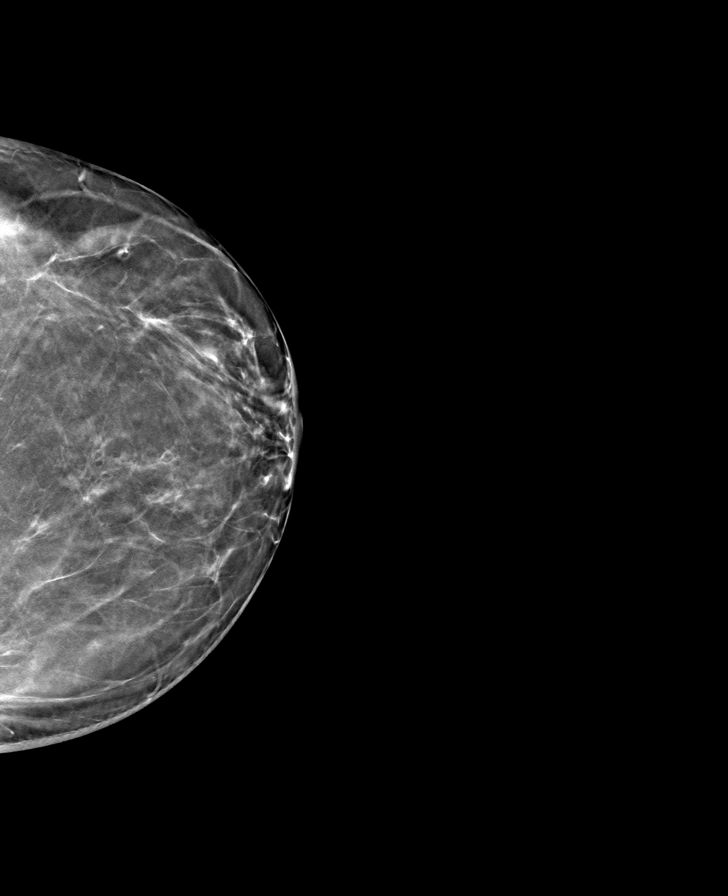

[R CC tomo · tomo slice 36/71.0]
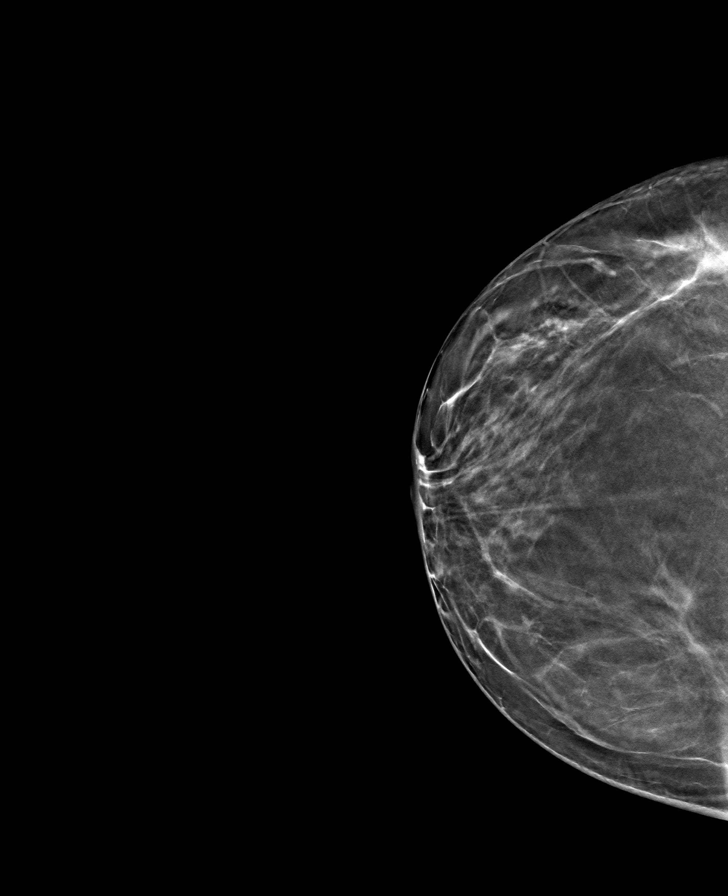

[L MLO tomo · tomo slice 40/79.0]
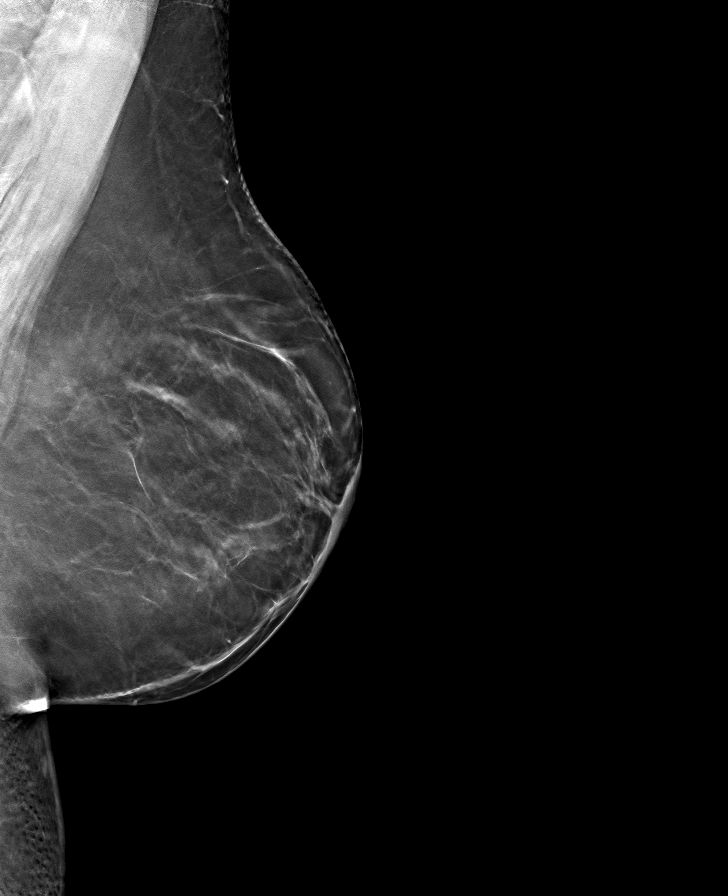

[8 of 24 positions shown; findings below may reference images not displayed]

ACR Breast Density Category b: There are scattered areas of
fibroglandular density.
FINDINGS: There are no findings suspicious for malignancy. Post reduction
changes. Images were processed with CAD.
IMPRESSION: No mammographic evidence of malignancy. A result letter of this
screening mammogram will be mailed directly to the patient.

RECOMMENDATION:
Screening mammogram in one year. (Code:[id number]-F-O8O)

BI-RADS CATEGORY  2: Benign.

## 2021-09-30 ENCOUNTER — Other Ambulatory Visit: Payer: Self-pay | Admitting: Family Medicine

## 2021-09-30 DIAGNOSIS — E785 Hyperlipidemia, unspecified: Secondary | ICD-10-CM

## 2022-01-01 ENCOUNTER — Other Ambulatory Visit: Payer: Self-pay | Admitting: Family Medicine

## 2022-01-01 DIAGNOSIS — E785 Hyperlipidemia, unspecified: Secondary | ICD-10-CM

## 2022-01-23 ENCOUNTER — Other Ambulatory Visit: Payer: Self-pay | Admitting: Family Medicine

## 2022-01-23 DIAGNOSIS — E785 Hyperlipidemia, unspecified: Secondary | ICD-10-CM

## 2022-02-13 ENCOUNTER — Ambulatory Visit (HOSPITAL_BASED_OUTPATIENT_CLINIC_OR_DEPARTMENT_OTHER): Payer: BC Managed Care – PPO | Admitting: Obstetrics & Gynecology

## 2022-03-10 ENCOUNTER — Other Ambulatory Visit: Payer: Self-pay | Admitting: Family Medicine

## 2022-03-10 ENCOUNTER — Other Ambulatory Visit (HOSPITAL_BASED_OUTPATIENT_CLINIC_OR_DEPARTMENT_OTHER): Payer: Self-pay | Admitting: Obstetrics & Gynecology

## 2022-03-10 DIAGNOSIS — E785 Hyperlipidemia, unspecified: Secondary | ICD-10-CM

## 2022-03-10 DIAGNOSIS — F411 Generalized anxiety disorder: Secondary | ICD-10-CM

## 2022-03-10 DIAGNOSIS — G4700 Insomnia, unspecified: Secondary | ICD-10-CM

## 2022-04-08 ENCOUNTER — Other Ambulatory Visit: Payer: Self-pay | Admitting: Family Medicine

## 2022-04-08 DIAGNOSIS — E785 Hyperlipidemia, unspecified: Secondary | ICD-10-CM

## 2022-04-11 ENCOUNTER — Telehealth: Payer: Self-pay | Admitting: Family Medicine

## 2022-04-11 NOTE — Telephone Encounter (Signed)
Medication: fenofibrate (TRICOR) 145 MG tablet  Has the patient contacted their pharmacy? No.   Preferred Pharmacy:  [facility] [address] [place], [place] [address] Phone: [phone]  Fax: [phone]

## 2022-05-01 ENCOUNTER — Other Ambulatory Visit: Payer: Self-pay | Admitting: Obstetrics & Gynecology

## 2022-05-01 DIAGNOSIS — Z1231 Encounter for screening mammogram for malignant neoplasm of breast: Secondary | ICD-10-CM

## 2022-05-20 ENCOUNTER — Ambulatory Visit: Payer: BC Managed Care – PPO | Admitting: Family Medicine

## 2022-05-20 ENCOUNTER — Encounter: Payer: Self-pay | Admitting: Family Medicine

## 2022-05-20 VITALS — BP 112/80 | HR 62 | Temp 97.8°F | Resp 18 | Ht 61.0 in | Wt 144.4 lb

## 2022-05-20 DIAGNOSIS — G4733 Obstructive sleep apnea (adult) (pediatric): Secondary | ICD-10-CM | POA: Diagnosis not present

## 2022-05-20 DIAGNOSIS — J4520 Mild intermittent asthma, uncomplicated: Secondary | ICD-10-CM

## 2022-05-20 DIAGNOSIS — E785 Hyperlipidemia, unspecified: Secondary | ICD-10-CM | POA: Diagnosis not present

## 2022-05-20 DIAGNOSIS — I059 Rheumatic mitral valve disease, unspecified: Secondary | ICD-10-CM | POA: Diagnosis not present

## 2022-05-20 LAB — COMPREHENSIVE METABOLIC PANEL
ALT: 16 U/L (ref 0–35)
AST: 19 U/L (ref 0–37)
Albumin: 4.3 g/dL (ref 3.5–5.2)
Alkaline Phosphatase: 43 U/L (ref 39–117)
BUN: 13 mg/dL (ref 6–23)
CO2: 30 mEq/L (ref 19–32)
Calcium: 9.5 mg/dL (ref 8.4–10.5)
Chloride: 103 mEq/L (ref 96–112)
Creatinine, Ser: 0.82 mg/dL (ref 0.40–1.20)
GFR: 76.9 mL/min (ref >60.00)
Glucose, Bld: 78 mg/dL (ref 70–99)
Potassium: 4.4 mEq/L (ref 3.5–5.1)
Sodium: 139 mEq/L (ref 135–145)
Total Bilirubin: 0.4 mg/dL (ref 0.2–1.2)
Total Protein: 6.6 g/dL (ref 6.0–8.3)

## 2022-05-20 LAB — CBC WITH DIFFERENTIAL/PLATELET
Basophils Absolute: 0 10*3/uL (ref 0.0–0.1)
Basophils Relative: 0.7 % (ref 0.0–3.0)
Eosinophils Absolute: 0.2 10*3/uL (ref 0.0–0.7)
Eosinophils Relative: 3.7 % (ref 0.0–5.0)
HCT: 40.9 % (ref 36.0–46.0)
Hemoglobin: 13.9 g/dL (ref 12.0–15.0)
Lymphocytes Relative: 34.1 % (ref 12.0–46.0)
Lymphs Abs: 1.8 10*3/uL (ref 0.7–4.0)
MCHC: 33.9 g/dL (ref 30.0–36.0)
MCV: 93.1 fl (ref 78.0–100.0)
Monocytes Absolute: 0.3 10*3/uL (ref 0.1–1.0)
Monocytes Relative: 6.3 % (ref 3.0–12.0)
Neutro Abs: 3 10*3/uL (ref 1.4–7.7)
Neutrophils Relative %: 55.2 % (ref 43.0–77.0)
Platelets: 312 10*3/uL (ref 150.0–400.0)
RBC: 4.39 Mil/uL (ref 3.87–5.11)
RDW: 13.2 % (ref 11.5–15.5)
WBC: 5.4 10*3/uL (ref 4.0–10.5)

## 2022-05-20 LAB — LIPID PANEL
Cholesterol: 335 mg/dL — ABNORMAL HIGH (ref 0–200)
HDL: 56.2 mg/dL (ref >39.00)
NonHDL: 279.09
Total CHOL/HDL Ratio: 6
Triglycerides: 219 mg/dL — ABNORMAL HIGH (ref 0.0–149.0)
VLDL: 43.8 mg/dL — ABNORMAL HIGH (ref 0.0–40.0)

## 2022-05-20 LAB — LDL CHOLESTEROL, DIRECT: Direct LDL: 245 mg/dL

## 2022-05-20 MED ORDER — FENOFIBRATE 145 MG PO TABS: 145.0000 mg | ORAL_TABLET | Freq: Every day | ORAL | 0 refills | Status: DC

## 2022-05-20 MED ORDER — ALBUTEROL SULFATE HFA 108 (90 BASE) MCG/ACT IN AERS: 2.0000 | INHALATION_SPRAY | Freq: Four times a day (QID) | RESPIRATORY_TRACT | 3 refills | Status: DC | PRN

## 2022-05-20 NOTE — Progress Notes (Signed)
Subjective:   By signing my name below, I, Brianna Carroll, attest that this documentation has been prepared under the direction and in the presence of Brianna Carroll 05/20/2022     Patient ID: Brianna Carroll, female    DOB: [birth year], 61 y.o.   MRN: [id number]  Chief Complaint  Patient presents with   Hyperlipidemia   Follow-up    HPI Patient is in today for am office visit.  She is not UTD on the influenza vaccine and is not interested in receiving a vaccine during today's visit. She is inquiring about receiving the RSV vaccine due to her asthma.   She states that she has drainage year around and she has been tolerating it. During the evenings she takes Benadryl and if symptoms worsen, she takes NyQuil which alleviates her symptoms.  She states that she was previously losing weight and therefore, discontinued her CPAP. She does not snore as much as she used to. However, after ending her diet, she has since gained about 5 lbs back and her husband states that she does snore some. She is currently taking 1 tablet of 10 mg of Paxil and 1 tablet of 50 mg of Trazodone. She states that she desires to not take medication to help her sleep. She also states that since retiring, her stress is decreasing. However, due to some familial concerns, she wakes up during the evening. Wt Readings from Last 3 Encounters:  05/20/22 144 lb 6.4 oz (65.5 kg)  02/05/21 166 lb 6.4 oz (75.5 kg)  12/19/20 164 lb (74.4 kg)   Past Medical History:  Diagnosis Date   Allergic rhinitis    Anemia, iron deficiency    Anxiety    Asthma    Hearing loss    Heart murmur    Hyperlipidemia    Low back pain syndrome    Mitral valve prolapse    Sinus headache    Sleep apnea    wears CPAP   Snoring     Past Surgical History:  Procedure Laterality Date   BREAST REDUCTION SURGERY Bilateral 12/10/2018   Procedure: BILATERAL MAMMARY REDUCTION  (BREAST);  Surgeon: Crissie Reese, MD;  Location: [facility];   Service: Plastics;  Laterality: Bilateral;   CESAREAN SECTION  11/90, 12/95   LUMBAR DISC SURGERY  2004   REDUCTION MAMMAPLASTY Bilateral 12/10/2018   TUBAL LIGATION  05/30/94    Family History  Problem Relation Age of Onset   Cancer Father        Lung    Cancer Mother        Lung Cancer   Cancer Brother        throat cancer-heavy smoker   COPD Sister    Asthma Son    Allergic rhinitis Son    Allergic rhinitis Son     Social History   Socioeconomic History   Marital status: Married    Spouse name: [name]   Number of children: 2   Years of education: Not on file   Highest education level: Not on file  Occupational History   Occupation: Product/process development scientist: [employer]    Comment: retired   Tobacco Use   Smoking status: Never   Smokeless tobacco: Never  Vaping Use   Vaping Use: Never used  Substance and Sexual Activity   Alcohol use: Yes    Alcohol/week: 1.0 standard drink of alcohol    Types: 1 Standard drinks or equivalent per week  Drug use: No   Sexual activity: Yes    Partners: Male    Birth control/protection: Surgical    Comment: BTL  Other Topics Concern   Not on file  Social History Narrative   Not on file   Social Determinants of Health   Financial Resource Strain: Not on file  Food Insecurity: Not on file  Transportation Needs: Not on file  Physical Activity: Not on file  Stress: Not on file  Social Connections: Not on file  Intimate Partner Violence: Not on file    Outpatient Medications Prior to Visit  Medication Sig Dispense Refill   azelastine (ASTELIN) 0.1 % nasal spray 1-2 sprays each nostril up to 2 times daily as needed for drainage. 30 mL 12   Cholecalciferol (VITAMIN D) 50 MCG (2000 UT) tablet Take 2,000 Units by mouth at bedtime.     fluticasone (FLONASE) 50 MCG/ACT nasal spray Place 2 sprays into both nostrils daily. 16 g 5   loratadine (CLARITIN) 10 MG tablet Take 1 tablet (10 mg total) by mouth daily. 30  tablet 0   naproxen (NAPROSYN) 500 MG tablet Take 1 tablet (500 mg total) by mouth 2 (two) times daily as needed for moderate pain. Take with food 180 tablet 1   PARoxetine (PAXIL) 10 MG tablet TAKE 1 & 1/2 (ONE & ONE-HALF) TABLETS BY MOUTH ONCE DAILY 135 tablet 0   traZODone (DESYREL) 50 MG tablet TAKE 1 TABLET BY MOUTH AT BEDTIME AS NEEDED FOR SLEEP 90 tablet 0   albuterol (VENTOLIN HFA) 108 (90 Base) MCG/ACT inhaler Inhale 2 puffs into the lungs every 6 (six) hours as needed for wheezing or shortness of breath. 1 each 3   fenofibrate (TRICOR) 145 MG tablet Take 1 tablet (145 mg total) by mouth daily. Please make Follow up appt for additional medication refills 30 tablet 0   ondansetron (ZOFRAN) 4 MG tablet Take 1 tablet (4 mg total) by mouth every 8 (eight) hours as needed for nausea or vomiting. (Patient not taking: Reported on 02/05/2021) 20 tablet 0   No facility-administered medications prior to visit.    Allergies  Allergen Reactions   Doxycycline Hives and Rash   Codeine Nausea And Vomiting   Hydrocodone Bit-Homatrop Mbr Nausea And Vomiting   Hydrocodone-Acetaminophen Nausea And Vomiting    Review of Systems  Constitutional:  Negative for fever and malaise/fatigue.  HENT:  Negative for congestion.   Eyes:  Negative for blurred vision.  Respiratory:  Negative for cough and shortness of breath.   Cardiovascular:  Negative for chest pain, palpitations and leg swelling.  Gastrointestinal:  Negative for abdominal pain, blood in stool and nausea.  Genitourinary:  Negative for dysuria and frequency.  Musculoskeletal:  Negative for falls.  Skin:  Negative for rash.  Neurological:  Negative for dizziness, loss of consciousness and headaches.  Endo/Heme/Allergies:  Negative for environmental allergies.  Psychiatric/Behavioral:  Negative for depression. The patient is not nervous/anxious.        Objective:    Physical Exam Vitals and nursing note reviewed.  Constitutional:       General: She is not in acute distress.    Appearance: Normal appearance. She is not ill-appearing.  HENT:     Head: Normocephalic and atraumatic.     Right Ear: External ear normal.     Left Ear: External ear normal.  Eyes:     Extraocular Movements: Extraocular movements intact.     Pupils: Pupils are equal, round, and reactive to light.  Cardiovascular:     Rate and Rhythm: Normal rate and regular rhythm.     Heart sounds: Murmur heard.     Systolic (1-2) murmur is present with a grade of 2/6.     No gallop.  Pulmonary:     Effort: Pulmonary effort is normal. No respiratory distress.     Breath sounds: Normal breath sounds. No wheezing or rales.  Skin:    General: Skin is warm and dry.  Neurological:     Mental Status: She is alert and oriented to person, place, and time.  Psychiatric:        Judgment: Judgment normal.     BP 112/80 (BP Location: Left Arm, Patient Position: Sitting, Cuff Size: Normal)   Pulse 62   Temp 97.8 F (36.6 C) (Oral)   Resp 18   Ht '5\' 1"'$  (1.549 m)   Wt 144 lb 6.4 oz (65.5 kg)   LMP 09/29/2013 (LMP Unknown)   SpO2 97%   BMI 27.28 kg/m  Wt Readings from Last 3 Encounters:  05/20/22 144 lb 6.4 oz (65.5 kg)  02/05/21 166 lb 6.4 oz (75.5 kg)  12/19/20 164 lb (74.4 kg)    Diabetic Foot Exam - Simple   No data filed    Lab Results  Component Value Date   WBC 11.4 (H) 12/19/2020   HGB 12.5 12/19/2020   HCT 37.0 12/19/2020   PLT 395.0 12/19/2020   GLUCOSE 103 (H) 12/20/2020   CHOL 191 10/02/2020   TRIG 126.0 10/02/2020   HDL 49.10 10/02/2020   LDLDIRECT 117.0 11/09/2019   LDLCALC 116 (H) 10/02/2020   ALT 27 12/20/2020   AST 31 12/20/2020   NA 139 12/20/2020   K 4.1 12/20/2020   CL 102 12/20/2020   CREATININE 0.91 12/20/2020   BUN 16 12/20/2020   CO2 28 12/20/2020   TSH 2.29 03/11/2019    Lab Results  Component Value Date   TSH 2.29 03/11/2019   Lab Results  Component Value Date   WBC 11.4 (H) 12/19/2020   HGB 12.5  12/19/2020   HCT 37.0 12/19/2020   MCV 92.6 12/19/2020   PLT 395.0 12/19/2020   Lab Results  Component Value Date   NA 139 12/20/2020   K 4.1 12/20/2020   CO2 28 12/20/2020   GLUCOSE 103 (H) 12/20/2020   BUN 16 12/20/2020   CREATININE 0.91 12/20/2020   BILITOT 0.4 12/20/2020   ALKPHOS 39 12/20/2020   AST 31 12/20/2020   ALT 27 12/20/2020   PROT 7.9 12/20/2020   ALBUMIN 4.5 12/20/2020   CALCIUM 9.8 12/20/2020   ANIONGAP 9 12/07/2018   GFR 68.54 12/20/2020   Lab Results  Component Value Date   CHOL 191 10/02/2020   Lab Results  Component Value Date   HDL 49.10 10/02/2020   Lab Results  Component Value Date   LDLCALC 116 (H) 10/02/2020   Lab Results  Component Value Date   TRIG 126.0 10/02/2020   Lab Results  Component Value Date   CHOLHDL 4 10/02/2020   No results found for: "HGBA1C"     Assessment & Plan:   Problem List Items Addressed This Visit       Unprioritized   OSA on CPAP    Pt had stopped using her cpap due to weight loss but recently gained some weight back She will use her cpap and work on weight loss       Mitral valve disease    stable  Relevant Medications   fenofibrate (TRICOR) 145 MG tablet   Mild intermittent asthma    Stable       Relevant Medications   albuterol (VENTOLIN HFA) 108 (90 Base) MCG/ACT inhaler   Hyperlipidemia - Primary    Encourage heart healthy diet such as MIND or DASH diet, increase exercise, avoid trans fats, simple carbohydrates and processed foods, consider a krill or fish or flaxseed oil cap daily.        Relevant Medications   fenofibrate (TRICOR) 145 MG tablet   Other Relevant Orders   CBC with Differential/Platelet   Comprehensive metabolic panel   Lipid panel   Meds ordered this encounter  Medications   fenofibrate (TRICOR) 145 MG tablet    Sig: Take 1 tablet (145 mg total) by mouth daily. Please make Follow up appt for additional medication refills    Dispense:  30 tablet    Refill:   0   albuterol (VENTOLIN HFA) 108 (90 Base) MCG/ACT inhaler    Sig: Inhale 2 puffs into the lungs every 6 (six) hours as needed for wheezing or shortness of breath.    Dispense:  1 each    Refill:  3    I, Brianna Held, Carroll, personally preformed the services described in this documentation.  All medical record entries made by the scribe were at my direction and in my presence.  I have reviewed the chart and discharge instructions (if applicable) and agree that the record reflects my personal performance and is accurate and complete. 05/20/2022   I,Amber Collins,acting as a scribe for Brianna Held, Carroll.,have documented all relevant documentation on the behalf of Brianna Held, Carroll,as directed by  Brianna Held, Carroll while in the presence of Brianna Held, Carroll.    Brianna Held, Carroll

## 2022-05-20 NOTE — Assessment & Plan Note (Signed)
Pt had stopped using her cpap due to weight loss but recently gained some weight back She will use her cpap and work on weight loss

## 2022-05-20 NOTE — Assessment & Plan Note (Signed)
stable °

## 2022-05-20 NOTE — Patient Instructions (Signed)
Cholesterol Content in Foods ?Cholesterol is a waxy, fat-like substance that helps to carry fat in the blood. The body needs cholesterol in small amounts, but too much cholesterol can cause damage to the arteries and heart. ?What foods have cholesterol? ? ?Cholesterol is found in animal-based foods, such as meat, seafood, and dairy. Generally, low-fat dairy and lean meats have less cholesterol than full-fat dairy and fatty meats. The milligrams of cholesterol per serving (mg per serving) of common cholesterol-containing foods are listed below. ?Meats and other proteins ?Egg -- one large whole egg has 186 mg. ?Veal shank -- 4 oz (113 g) has 141 mg. ?Lean ground turkey (93% lean) -- 4 oz (113 g) has 118 mg. ?Fat-trimmed lamb loin -- 4 oz (113 g) has 106 mg. ?Lean ground beef (90% lean) -- 4 oz (113 g) has 100 mg. ?Lobster -- 3.5 oz (99 g) has 90 mg. ?Pork loin chops -- 4 oz (113 g) has 86 mg. ?Canned salmon -- 3.5 oz (99 g) has 83 mg. ?Fat-trimmed beef top loin -- 4 oz (113 g) has 78 mg. ?Frankfurter -- 1 frank (3.5 oz or 99 g) has 77 mg. ?Crab -- 3.5 oz (99 g) has 71 mg. ?Roasted chicken without skin, white meat -- 4 oz (113 g) has 66 mg. ?Light bologna -- 2 oz (57 g) has 45 mg. ?Deli-cut turkey -- 2 oz (57 g) has 31 mg. ?Canned tuna -- 3.5 oz (99 g) has 31 mg. ?Bacon -- 1 oz (28 g) has 29 mg. ?Oysters and mussels (raw) -- 3.5 oz (99 g) has 25 mg. ?Mackerel -- 1 oz (28 g) has 22 mg. ?Trout -- 1 oz (28 g) has 20 mg. ?Pork sausage -- 1 link (1 oz or 28 g) has 17 mg. ?Salmon -- 1 oz (28 g) has 16 mg. ?Tilapia -- 1 oz (28 g) has 14 mg. ?Dairy ?Soft-serve ice cream -- ? cup (4 oz or 86 g) has 103 mg. ?Whole-milk yogurt -- 1 cup (8 oz or 245 g) has 29 mg. ?Cheddar cheese -- 1 oz (28 g) has 28 mg. ?American cheese -- 1 oz (28 g) has 28 mg. ?Whole milk -- 1 cup (8 oz or 250 mL) has 23 mg. ?2% milk -- 1 cup (8 oz or 250 mL) has 18 mg. ?Cream cheese -- 1 tablespoon (Tbsp) (14.5 g) has 15 mg. ?Cottage cheese -- ? cup (4 oz or  113 g) has 14 mg. ?Low-fat (1%) milk -- 1 cup (8 oz or 250 mL) has 10 mg. ?Sour cream -- 1 Tbsp (12 g) has 8.5 mg. ?Low-fat yogurt -- 1 cup (8 oz or 245 g) has 8 mg. ?Nonfat Greek yogurt -- 1 cup (8 oz or 228 g) has 7 mg. ?Half-and-half cream -- 1 Tbsp (15 mL) has 5 mg. ?Fats and oils ?Cod liver oil -- 1 tablespoon (Tbsp) (13.6 g) has 82 mg. ?Butter -- 1 Tbsp (14 g) has 15 mg. ?Lard -- 1 Tbsp (12.8 g) has 14 mg. ?Bacon grease -- 1 Tbsp (12.9 g) has 14 mg. ?Mayonnaise -- 1 Tbsp (13.8 g) has 5-10 mg. ?Margarine -- 1 Tbsp (14 g) has 3-10 mg. ?The items listed above may not be a complete list of foods with cholesterol. Exact amounts of cholesterol in these foods may vary depending on specific ingredients and brands. Contact a dietitian for more information. ?What foods do not have cholesterol? ?Most plant-based foods do not have cholesterol unless you combine them with a food that has   cholesterol. Foods without cholesterol include: ?Grains and cereals. ?Vegetables. ?Fruits. ?Vegetable oils, such as olive, canola, and sunflower oil. ?Legumes, such as peas, beans, and lentils. ?Nuts and seeds. ?Egg whites. ?The items listed above may not be a complete list of foods that do not have cholesterol. Contact a dietitian for more information. ?Summary ?The body needs cholesterol in small amounts, but too much cholesterol can cause damage to the arteries and heart. ?Cholesterol is found in animal-based foods, such as meat, seafood, and dairy. Generally, low-fat dairy and lean meats have less cholesterol than full-fat dairy and fatty meats. ?This information is not intended to replace advice given to you by your health care provider. Make sure you discuss any questions you have with your health care provider. ?Document Revised: 10/13/2020 Document Reviewed: 10/13/2020 ?Elsevier Patient Education ? 2023 Elsevier Inc. ? ?

## 2022-05-20 NOTE — Assessment & Plan Note (Signed)
Encourage heart healthy diet such as MIND or DASH diet, increase exercise, avoid trans fats, simple carbohydrates and processed foods, consider a krill or fish or flaxseed oil cap daily.  °

## 2022-05-20 NOTE — Assessment & Plan Note (Signed)
Stable

## 2022-05-22 MED ORDER — ROSUVASTATIN CALCIUM 10 MG PO TABS
10.0000 mg | ORAL_TABLET | Freq: Every day | ORAL | 2 refills | Status: DC
Start: 2022-05-22 — End: 2022-09-17

## 2022-05-22 NOTE — Addendum Note (Signed)
Addended byDamita Dunnings D on: 05/22/2022 04:13 PM   Modules accepted: Orders

## 2022-06-12 ENCOUNTER — Ambulatory Visit: Payer: BC Managed Care – PPO

## 2022-06-13 ENCOUNTER — Other Ambulatory Visit: Payer: Self-pay | Admitting: Family Medicine

## 2022-06-13 ENCOUNTER — Other Ambulatory Visit (HOSPITAL_BASED_OUTPATIENT_CLINIC_OR_DEPARTMENT_OTHER): Payer: Self-pay | Admitting: Obstetrics & Gynecology

## 2022-06-13 DIAGNOSIS — M545 Low back pain, unspecified: Secondary | ICD-10-CM

## 2022-06-13 DIAGNOSIS — E785 Hyperlipidemia, unspecified: Secondary | ICD-10-CM

## 2022-06-13 DIAGNOSIS — G4700 Insomnia, unspecified: Secondary | ICD-10-CM

## 2022-06-14 ENCOUNTER — Other Ambulatory Visit (HOSPITAL_BASED_OUTPATIENT_CLINIC_OR_DEPARTMENT_OTHER): Payer: Self-pay | Admitting: Obstetrics & Gynecology

## 2022-06-14 ENCOUNTER — Ambulatory Visit
Admission: RE | Admit: 2022-06-14 | Discharge: 2022-06-14 | Disposition: A | Discharge status: Home / Self Care | Payer: BC Managed Care – PPO | Source: Ambulatory Visit | Attending: Obstetrics & Gynecology | Admitting: Obstetrics & Gynecology

## 2022-06-14 DIAGNOSIS — G4700 Insomnia, unspecified: Secondary | ICD-10-CM

## 2022-06-14 DIAGNOSIS — Z1231 Encounter for screening mammogram for malignant neoplasm of breast: Secondary | ICD-10-CM

## 2022-06-14 MED ORDER — TRAZODONE HCL 50 MG PO TABS: 50.0000 mg | ORAL_TABLET | Freq: Every evening | ORAL | 0 refills | Status: DC | PRN

## 2022-06-20 ENCOUNTER — Ambulatory Visit: Payer: BC Managed Care – PPO | Admitting: Family Medicine

## 2022-06-20 ENCOUNTER — Other Ambulatory Visit (HOSPITAL_BASED_OUTPATIENT_CLINIC_OR_DEPARTMENT_OTHER): Payer: Self-pay

## 2022-06-20 ENCOUNTER — Ambulatory Visit (INDEPENDENT_AMBULATORY_CARE_PROVIDER_SITE_OTHER): Payer: BC Managed Care – PPO | Admitting: Obstetrics & Gynecology

## 2022-06-20 ENCOUNTER — Encounter (HOSPITAL_BASED_OUTPATIENT_CLINIC_OR_DEPARTMENT_OTHER): Payer: Self-pay | Admitting: Obstetrics & Gynecology

## 2022-06-20 ENCOUNTER — Encounter: Payer: Self-pay | Admitting: Family Medicine

## 2022-06-20 VITALS — BP 125/61 | HR 64 | Ht 61.0 in | Wt 147.8 lb

## 2022-06-20 DIAGNOSIS — R413 Other amnesia: Secondary | ICD-10-CM | POA: Diagnosis not present

## 2022-06-20 DIAGNOSIS — Z780 Asymptomatic menopausal state: Secondary | ICD-10-CM

## 2022-06-20 DIAGNOSIS — F5101 Primary insomnia: Secondary | ICD-10-CM

## 2022-06-20 DIAGNOSIS — Z9889 Other specified postprocedural states: Secondary | ICD-10-CM

## 2022-06-20 DIAGNOSIS — F411 Generalized anxiety disorder: Secondary | ICD-10-CM

## 2022-06-20 DIAGNOSIS — Z01419 Encounter for gynecological examination (general) (routine) without abnormal findings: Secondary | ICD-10-CM | POA: Diagnosis not present

## 2022-06-20 MED ORDER — PAROXETINE HCL 10 MG PO TABS: 10.0000 mg | ORAL_TABLET | Freq: Every day | ORAL | 3 refills | Status: DC

## 2022-06-20 MED ORDER — AREXVY 120 MCG/0.5ML IM SUSR
INTRAMUSCULAR | 0 refills | Status: DC
  Filled 2022-06-20: qty 0.5, 1d supply, fill #0

## 2022-06-20 NOTE — Progress Notes (Signed)
62 y.o. G2P2 Married White or Caucasian female here for annual exam.  Has son who has "a lot of issues" and this caused holiday stressors.  Second son is doing well and has a really nice girlfriend.    Denies vaginal bleeding.    Feels like she is not making great decisions.  Worried about cognitive function.  Worried about her memory.  Reports she went today for follow up with Dr. Etter Sjogren.  She thought it was supposed to be 3 weeks but it was supposed to be 3 months.  Would like referral.  Is on '10mg'$  paxil for mood and hot flashes.  Tried to stop it this year but had hot flashes so back on '10mg'$  dosage.  Does need RF.  Using trazodone as well for sleep.  Does not need RF today.  Patient's last menstrual period was 09/29/2013 (lmp unknown).          Sexually active: Yes.    The current method of family planning is post menopausal status.    Smoker:  no  Health Maintenance: Pap:  02/05/2021 Negative History of abnormal Pap:  no MMG:  06/14/2022 Negative Colonoscopy:  06/07/2016, follow up 10 years BMD:   06/15/2020  Osteopenia Screening Labs: Done 05/2022.  Cholesterol was 335.     reports that she has never smoked. She has never used smokeless tobacco. She reports current alcohol use of about 1.0 standard drink of alcohol per week. She reports that she does not use drugs.  Past Medical History:  Diagnosis Date   Allergic rhinitis    Anemia, iron deficiency    Anxiety    Asthma    Hearing loss    Heart murmur    Hyperlipidemia    Low back pain syndrome    Mitral valve prolapse    Sinus headache    Sleep apnea    wears CPAP   Snoring     Past Surgical History:  Procedure Laterality Date   BREAST REDUCTION SURGERY Bilateral 12/10/2018   Procedure: BILATERAL MAMMARY REDUCTION  (BREAST);  Surgeon: Crissie Reese, MD;  Location: [facility];  Service: Plastics;  Laterality: Bilateral;   CESAREAN SECTION  11/90, 12/95   LUMBAR DISC SURGERY  2004   REDUCTION MAMMAPLASTY Bilateral  12/10/2018   TUBAL LIGATION  05/30/94    Current Outpatient Medications  Medication Sig Dispense Refill   albuterol (VENTOLIN HFA) 108 (90 Base) MCG/ACT inhaler Inhale 2 puffs into the lungs every 6 (six) hours as needed for wheezing or shortness of breath. 1 each 3   azelastine (ASTELIN) 0.1 % nasal spray 1-2 sprays each nostril up to 2 times daily as needed for drainage. 30 mL 12   Cholecalciferol (VITAMIN D) 50 MCG (2000 UT) tablet Take 2,000 Units by mouth at bedtime.     fenofibrate (TRICOR) 145 MG tablet Take 1 tablet (145 mg total) by mouth daily. 90 tablet 1   fluticasone (FLONASE) 50 MCG/ACT nasal spray Place 2 sprays into both nostrils daily. 16 g 5   loratadine (CLARITIN) 10 MG tablet Take 1 tablet (10 mg total) by mouth daily. 30 tablet 0   naproxen (NAPROSYN) 500 MG tablet TAKE 1 TABLET BY MOUTH TWICE DAILY AS NEEDED WITH FOOD FOR MODERATE PAIN 180 tablet 0   rosuvastatin (CRESTOR) 10 MG tablet Take 1 tablet (10 mg total) by mouth at bedtime. 30 tablet 2   traZODone (DESYREL) 50 MG tablet Take 1 tablet (50 mg total) by mouth at bedtime as needed. for  sleep 90 tablet 0   PARoxetine (PAXIL) 10 MG tablet Take 1 tablet (10 mg total) by mouth daily. TAKE 1 & 1/2 (ONE & ONE-HALF) TABLETS BY MOUTH ONCE DAILY 90 tablet 3   No current facility-administered medications for this visit.    Family History  Problem Relation Age of Onset   Cancer Father        Lung    Cancer Mother        Lung Cancer   Cancer Brother        throat cancer-heavy smoker   COPD Sister    Asthma Son    Allergic rhinitis Son    Allergic rhinitis Son     ROS: Constitutional: negative Genitourinary:negative  Exam:   BP 125/61 (BP Location: Left Arm, Patient Position: Sitting, Cuff Size: Normal)   Pulse 64   Ht '5\' 1"'$  (1.549 m) Comment: Reported  Wt 147 lb 12.8 oz (67 kg)   LMP 09/29/2013 (LMP Unknown)   BMI 27.93 kg/m   Height: '5\' 1"'$  (154.9 cm) (Reported)  General appearance: alert, cooperative  and appears stated age Head: Normocephalic, without obvious abnormality, atraumatic Neck: no adenopathy, supple, symmetrical, trachea midline and thyroid normal to inspection and palpation Lungs: clear to auscultation bilaterally Breasts: normal appearance, no masses or tenderness Heart: regular rate and rhythm Abdomen: soft, non-tender; bowel sounds normal; no masses,  no organomegaly Extremities: extremities normal, atraumatic, no cyanosis or edema Skin: Skin color, texture, turgor normal. No rashes or lesions Lymph nodes: Cervical, supraclavicular, and axillary nodes normal. No abnormal inguinal nodes palpated Neurologic: Grossly normal   Pelvic: External genitalia:  no lesions              Urethra:  normal appearing urethra with no masses, tenderness or lesions              Bartholins and Skenes: normal                 Vagina: normal appearing vagina with normal color and no discharge, no lesions              Cervix: no lesions              Pap taken: No. Bimanual Exam:  Uterus:  normal size, contour, position, consistency, mobility, non-tender              Adnexa: normal adnexa and no mass, fullness, tenderness               Rectovaginal: Confirms               Anus:  normal sphincter tone, no lesions  Chaperone, Octaviano Batty, CMA, was present for exam.  Assessment/Plan: 1. Well woman exam with routine gynecological exam - Pap smear neg 2022.  Not indicated today. - Mammogram up to date - Colonoscopy 2017.  Follow up 10 years. - Bone mineral density 2021.  Will repeat in 1 or two more yeasr - lab work done with PCP, Dr. Etter Sjogren - vaccines reviewed/updated.  Pt interested in RSV vaccine.  Will go to pharmacy when she leaves here today.  2. Postmenopausal - not on HRT  3. S/P bilateral breast reduction  4. Memory changes - Ambulatory referral to Neurology  5. Generalized anxiety disorder - PARoxetine (PAXIL) 10 MG tablet; Take 1 tablet (10 mg total) by mouth daily. TAKE  1 & 1/2 (ONE & ONE-HALF) TABLETS BY MOUTH ONCE DAILY  Dispense: 90 tablet; Refill: 3  6. Primary insomnia - on  Trazodone.  Does not need RF.

## 2022-06-21 ENCOUNTER — Ambulatory Visit: Payer: BC Managed Care – PPO | Admitting: Family Medicine

## 2022-06-21 ENCOUNTER — Other Ambulatory Visit (HOSPITAL_BASED_OUTPATIENT_CLINIC_OR_DEPARTMENT_OTHER): Payer: Self-pay

## 2022-07-02 ENCOUNTER — Other Ambulatory Visit (HOSPITAL_BASED_OUTPATIENT_CLINIC_OR_DEPARTMENT_OTHER): Payer: Self-pay | Admitting: Obstetrics & Gynecology

## 2022-07-02 DIAGNOSIS — F411 Generalized anxiety disorder: Secondary | ICD-10-CM

## 2022-07-02 MED ORDER — PAROXETINE HCL 10 MG PO TABS: 10.0000 mg | ORAL_TABLET | Freq: Every day | ORAL | 3 refills | Status: DC

## 2022-08-13 ENCOUNTER — Ambulatory Visit: Payer: BC Managed Care – PPO | Admitting: Neurology

## 2022-08-19 ENCOUNTER — Ambulatory Visit: Payer: BC Managed Care – PPO | Admitting: Family Medicine

## 2022-08-21 ENCOUNTER — Other Ambulatory Visit (HOSPITAL_BASED_OUTPATIENT_CLINIC_OR_DEPARTMENT_OTHER): Payer: Self-pay | Admitting: Obstetrics & Gynecology

## 2022-08-21 DIAGNOSIS — F411 Generalized anxiety disorder: Secondary | ICD-10-CM

## 2022-09-04 ENCOUNTER — Telehealth: Payer: Self-pay | Admitting: Family Medicine

## 2022-09-04 ENCOUNTER — Telehealth: Payer: Self-pay | Admitting: Adult Health

## 2022-09-04 NOTE — Telephone Encounter (Signed)
Spoke with patient. I advised since she hasn't been seen since 2020 she would be considered a new patient and we wont be able to replace her machine until the appointment. I gave her the number to [employer] so maybe they could fix it for her. She verbalized understanding. Nothing further needed.

## 2022-09-04 NOTE — Telephone Encounter (Signed)
Pt said that her cpap machine is broke and she isn't sure who she should speak to about this. Pt said she saw Dr. Patricia Nettle a couple of years ago so would like another referral to see her again. Please call patient to advise if she should schedule follow up with Dr. Etter Sjogren about this or if she can just get a referral sent. She will also call the office to see if she needs a new referral or if she can just schedule appt since she was established in their office.

## 2022-09-04 NOTE — Telephone Encounter (Signed)
She's going to be a new patient since she hasn't been here in >3 years so will have to wait until the appointment. She will likely have to repeat a sleep study as well if she has been on CPAP. Please change her to sleep consult. Thanks.

## 2022-09-04 NOTE — Telephone Encounter (Signed)
Looks patient has already spoke with pulmonology this morning and has appointment scheduled.

## 2022-09-04 NOTE — Telephone Encounter (Signed)
Spoke with patient. She advises cpap machine is no longer working and is requesting a new one. Current machine is at least 63 years old. She advised she had stopped using cpap machine after losing weight, but she has currently gained weight back and started snoring again. I have scheduled her with you next thrusday. Can I place an order for her to get a new machine. Unable to get a DL

## 2022-09-04 NOTE — Telephone Encounter (Signed)
Pt called in bc her Cpap machine broke and she needs a new one, pls advise

## 2022-09-07 ENCOUNTER — Other Ambulatory Visit (HOSPITAL_BASED_OUTPATIENT_CLINIC_OR_DEPARTMENT_OTHER): Payer: Self-pay | Admitting: Obstetrics & Gynecology

## 2022-09-07 DIAGNOSIS — G4700 Insomnia, unspecified: Secondary | ICD-10-CM

## 2022-09-12 ENCOUNTER — Institutional Professional Consult (permissible substitution): Payer: BC Managed Care – PPO | Admitting: Nurse Practitioner

## 2022-09-17 ENCOUNTER — Ambulatory Visit: Payer: BC Managed Care – PPO | Admitting: Primary Care

## 2022-09-17 ENCOUNTER — Encounter: Payer: Self-pay | Admitting: Family Medicine

## 2022-09-17 ENCOUNTER — Ambulatory Visit: Payer: BC Managed Care – PPO | Admitting: Family Medicine

## 2022-09-17 ENCOUNTER — Encounter: Payer: Self-pay | Admitting: Primary Care

## 2022-09-17 VITALS — BP 120/78 | HR 61 | Temp 97.8°F | Resp 16 | Ht 61.0 in | Wt 154.6 lb

## 2022-09-17 VITALS — BP 124/82 | HR 74 | Temp 98.2°F | Ht 61.0 in | Wt 156.2 lb

## 2022-09-17 DIAGNOSIS — G4733 Obstructive sleep apnea (adult) (pediatric): Secondary | ICD-10-CM

## 2022-09-17 DIAGNOSIS — J3089 Other allergic rhinitis: Secondary | ICD-10-CM | POA: Diagnosis not present

## 2022-09-17 DIAGNOSIS — E785 Hyperlipidemia, unspecified: Secondary | ICD-10-CM | POA: Diagnosis not present

## 2022-09-17 DIAGNOSIS — F5104 Psychophysiologic insomnia: Secondary | ICD-10-CM

## 2022-09-17 DIAGNOSIS — J302 Other seasonal allergic rhinitis: Secondary | ICD-10-CM | POA: Diagnosis not present

## 2022-09-17 LAB — COMPREHENSIVE METABOLIC PANEL
ALT: 18 U/L (ref 0–35)
AST: 20 U/L (ref 0–37)
Albumin: 4.6 g/dL (ref 3.5–5.2)
Alkaline Phosphatase: 29 U/L — ABNORMAL LOW (ref 39–117)
BUN: 16 mg/dL (ref 6–23)
CO2: 31 mEq/L (ref 19–32)
Calcium: 9.9 mg/dL (ref 8.4–10.5)
Chloride: 102 mEq/L (ref 96–112)
Creatinine, Ser: 0.92 mg/dL (ref 0.40–1.20)
GFR: 66.82 mL/min (ref >60.00)
Glucose, Bld: 82 mg/dL (ref 70–99)
Potassium: 4.6 mEq/L (ref 3.5–5.1)
Sodium: 138 mEq/L (ref 135–145)
Total Bilirubin: 0.3 mg/dL (ref 0.2–1.2)
Total Protein: 6.7 g/dL (ref 6.0–8.3)

## 2022-09-17 LAB — LIPID PANEL
Cholesterol: 286 mg/dL — ABNORMAL HIGH (ref 0–200)
HDL: 56.5 mg/dL (ref >39.00)
LDL Cholesterol: 192 mg/dL — ABNORMAL HIGH (ref 0–99)
NonHDL: 229
Total CHOL/HDL Ratio: 5
Triglycerides: 187 mg/dL — ABNORMAL HIGH (ref 0.0–149.0)
VLDL: 37.4 mg/dL (ref 0.0–40.0)

## 2022-09-17 MED ORDER — AZELASTINE HCL 0.1 % NA SOLN: NASAL | 12 refills | Status: DC

## 2022-09-17 NOTE — Assessment & Plan Note (Signed)
-   Well controlled on Trazodone 50mg  qhs. No residual grogginess. - No changes needed today

## 2022-09-17 NOTE — Patient Instructions (Signed)
Cholesterol Content in Foods ?Cholesterol is a waxy, fat-like substance that helps to carry fat in the blood. The body needs cholesterol in small amounts, but too much cholesterol can cause damage to the arteries and heart. ?What foods have cholesterol? ? ?Cholesterol is found in animal-based foods, such as meat, seafood, and dairy. Generally, low-fat dairy and lean meats have less cholesterol than full-fat dairy and fatty meats. The milligrams of cholesterol per serving (mg per serving) of common cholesterol-containing foods are listed below. ?Meats and other proteins ?Egg -- one large whole egg has 186 mg. ?Veal shank -- 4 oz (113 g) has 141 mg. ?Lean ground turkey (93% lean) -- 4 oz (113 g) has 118 mg. ?Fat-trimmed lamb loin -- 4 oz (113 g) has 106 mg. ?Lean ground beef (90% lean) -- 4 oz (113 g) has 100 mg. ?Lobster -- 3.5 oz (99 g) has 90 mg. ?Pork loin chops -- 4 oz (113 g) has 86 mg. ?Canned salmon -- 3.5 oz (99 g) has 83 mg. ?Fat-trimmed beef top loin -- 4 oz (113 g) has 78 mg. ?Frankfurter -- 1 frank (3.5 oz or 99 g) has 77 mg. ?Crab -- 3.5 oz (99 g) has 71 mg. ?Roasted chicken without skin, white meat -- 4 oz (113 g) has 66 mg. ?Light bologna -- 2 oz (57 g) has 45 mg. ?Deli-cut turkey -- 2 oz (57 g) has 31 mg. ?Canned tuna -- 3.5 oz (99 g) has 31 mg. ?Bacon -- 1 oz (28 g) has 29 mg. ?Oysters and mussels (raw) -- 3.5 oz (99 g) has 25 mg. ?Mackerel -- 1 oz (28 g) has 22 mg. ?Trout -- 1 oz (28 g) has 20 mg. ?Pork sausage -- 1 link (1 oz or 28 g) has 17 mg. ?Salmon -- 1 oz (28 g) has 16 mg. ?Tilapia -- 1 oz (28 g) has 14 mg. ?Dairy ?Soft-serve ice cream -- ? cup (4 oz or 86 g) has 103 mg. ?Whole-milk yogurt -- 1 cup (8 oz or 245 g) has 29 mg. ?Cheddar cheese -- 1 oz (28 g) has 28 mg. ?American cheese -- 1 oz (28 g) has 28 mg. ?Whole milk -- 1 cup (8 oz or 250 mL) has 23 mg. ?2% milk -- 1 cup (8 oz or 250 mL) has 18 mg. ?Cream cheese -- 1 tablespoon (Tbsp) (14.5 g) has 15 mg. ?Cottage cheese -- ? cup (4 oz or  113 g) has 14 mg. ?Low-fat (1%) milk -- 1 cup (8 oz or 250 mL) has 10 mg. ?Sour cream -- 1 Tbsp (12 g) has 8.5 mg. ?Low-fat yogurt -- 1 cup (8 oz or 245 g) has 8 mg. ?Nonfat Greek yogurt -- 1 cup (8 oz or 228 g) has 7 mg. ?Half-and-half cream -- 1 Tbsp (15 mL) has 5 mg. ?Fats and oils ?Cod liver oil -- 1 tablespoon (Tbsp) (13.6 g) has 82 mg. ?Butter -- 1 Tbsp (14 g) has 15 mg. ?Lard -- 1 Tbsp (12.8 g) has 14 mg. ?Bacon grease -- 1 Tbsp (12.9 g) has 14 mg. ?Mayonnaise -- 1 Tbsp (13.8 g) has 5-10 mg. ?Margarine -- 1 Tbsp (14 g) has 3-10 mg. ?The items listed above may not be a complete list of foods with cholesterol. Exact amounts of cholesterol in these foods may vary depending on specific ingredients and brands. Contact a dietitian for more information. ?What foods do not have cholesterol? ?Most plant-based foods do not have cholesterol unless you combine them with a food that has   cholesterol. Foods without cholesterol include: ?Grains and cereals. ?Vegetables. ?Fruits. ?Vegetable oils, such as olive, canola, and sunflower oil. ?Legumes, such as peas, beans, and lentils. ?Nuts and seeds. ?Egg whites. ?The items listed above may not be a complete list of foods that do not have cholesterol. Contact a dietitian for more information. ?Summary ?The body needs cholesterol in small amounts, but too much cholesterol can cause damage to the arteries and heart. ?Cholesterol is found in animal-based foods, such as meat, seafood, and dairy. Generally, low-fat dairy and lean meats have less cholesterol than full-fat dairy and fatty meats. ?This information is not intended to replace advice given to you by your health care provider. Make sure you discuss any questions you have with your health care provider. ?Document Revised: 10/13/2020 Document Reviewed: 10/13/2020 ?Elsevier Patient Education ? 2023 Elsevier Inc. ? ?

## 2022-09-17 NOTE — Patient Instructions (Signed)
You had sleep study in 2018 that showed mild sleep apnea (worse during REM sleep)  Due to break in therapy we will need to repeat sleep study, we can do home test  Depending on results we can consider oral appliance versus CPAP  Recommend trying off benadryl. Continue Loratadine and nasal spray Continue trazodone 50mg  at bedtime for sleep   Follow-up Call for results 1-2 weeks after sleep study   Sleep Apnea Sleep apnea is a condition in which breathing pauses or becomes shallow during sleep. People with sleep apnea usually snore loudly. They may have times when they gasp and stop breathing for 10 seconds or more during sleep. This may happen many times during the night. Sleep apnea disrupts your sleep and keeps your body from getting the rest that it needs. This condition can increase your risk of certain health problems, including: Heart attack. Stroke. Obesity. Type 2 diabetes. Heart failure. Irregular heartbeat. High blood pressure. The goal of treatment is to help you breathe normally again. What are the causes?  The most common cause of sleep apnea is a collapsed or blocked airway. There are three kinds of sleep apnea: Obstructive sleep apnea. This kind is caused by a blocked or collapsed airway. Central sleep apnea. This kind happens when the part of the brain that controls breathing does not send the correct signals to the muscles that control breathing. Mixed sleep apnea. This is a combination of obstructive and central sleep apnea. What increases the risk? You are more likely to develop this condition if you: Are overweight. Smoke. Have a smaller than normal airway. Are older. Are female. Drink alcohol. Take sedatives or tranquilizers. Have a family history of sleep apnea. Have a tongue or tonsils that are larger than normal. What are the signs or symptoms? Symptoms of this condition include: Trouble staying asleep. Loud snoring. Morning headaches. Waking up  gasping. Dry mouth or sore throat in the morning. Daytime sleepiness and tiredness. If you have daytime fatigue because of sleep apnea, you may be more likely to have: Trouble concentrating. Forgetfulness. Irritability or mood swings. Personality changes. Feelings of depression. Sexual dysfunction. This may include loss of interest if you are female, or erectile dysfunction if you are female. How is this diagnosed? This condition may be diagnosed with: A medical history. A physical exam. A series of tests that are done while you are sleeping (sleep study). These tests are usually done in a sleep lab, but they may also be done at home. How is this treated? Treatment for this condition aims to restore normal breathing and to ease symptoms during sleep. It may involve managing health issues that can affect breathing, such as high blood pressure or obesity. Treatment may include: Sleeping on your side. Using a decongestant if you have nasal congestion. Avoiding the use of depressants, including alcohol, sedatives, and narcotics. Losing weight if you are overweight. Making changes to your diet. Quitting smoking. Using a device to open your airway while you sleep, such as: An oral appliance. This is a custom-made mouthpiece that shifts your lower jaw forward. A continuous positive airway pressure (CPAP) device. This device blows air through a mask when you breathe out (exhale). A nasal expiratory positive airway pressure (EPAP) device. This device has valves that you put into each nostril. A bi-level positive airway pressure (BIPAP) device. This device blows air through a mask when you breathe in (inhale) and breathe out (exhale). Having surgery if other treatments do not work. During surgery, excess  tissue is removed to create a wider airway. Follow these instructions at home: Lifestyle Make any lifestyle changes that your health care provider recommends. Eat a healthy, well-balanced  diet. Take steps to lose weight if you are overweight. Avoid using depressants, including alcohol, sedatives, and narcotics. Do not use any products that contain nicotine or tobacco. These products include cigarettes, chewing tobacco, and vaping devices, such as e-cigarettes. If you need help quitting, ask your health care provider. General instructions Take over-the-counter and prescription medicines only as told by your health care provider. If you were given a device to open your airway while you sleep, use it only as told by your health care provider. If you are having surgery, make sure to tell your health care provider you have sleep apnea. You may need to bring your device with you. Keep all follow-up visits. This is important. Contact a health care provider if: The device that you received to open your airway during sleep is uncomfortable or does not seem to be working. Your symptoms do not improve. Your symptoms get worse. Get help right away if: You develop: Chest pain. Shortness of breath. Discomfort in your back, arms, or stomach. You have: Trouble speaking. Weakness on one side of your body. Drooping in your face. These symptoms may represent a serious problem that is an emergency. Do not wait to see if the symptoms will go away. Get medical help right away. Call your local emergency services (911 in the [place] Do not drive yourself to the hospital. Summary Sleep apnea is a condition in which breathing pauses or becomes shallow during sleep. The most common cause is a collapsed or blocked airway. The goal of treatment is to restore normal breathing and to ease symptoms during sleep. This information is not intended to replace advice given to you by your health care provider. Make sure you discuss any questions you have with your health care provider. Document Revised: 01/10/2021 Document Reviewed: 05/12/2020 Elsevier Patient Education  [place].

## 2022-09-17 NOTE — Assessment & Plan Note (Signed)
Con't flonase and add astelin  Con't antihistamine  F/u as needed

## 2022-09-17 NOTE — Assessment & Plan Note (Signed)
-   She deals with chronic sinusitis symptoms. Not actively flared today. She takes loratadine daily and benadryl at bedtime. Recommend she trial off regular diphenhydramine and use nasal spray for symptom management.

## 2022-09-17 NOTE — Assessment & Plan Note (Addendum)
-   Hx mild OSA, dx in 2018. She was on CPAP for several years but stopped wearing in July 2023 after losing 35 lbs. She did find CPAP beneficial for sleep quality. Her current CPAP machine is broken. She has symptoms of loud snoring. She will need repeat home sleep study to re-evaluate OSA. We reviewed risks of untreated sleep apnea and treatments options, she is interested possibly in oral appliance vs re-starting CPAP. Advised she focus on side sleeping position and avoid alcohol/sedatives prior to bedtime. FU 1-2 weeks after sleep study to review results.

## 2022-09-17 NOTE — Assessment & Plan Note (Signed)
Encourage heart healthy diet such as MIND or DASH diet, increase exercise, avoid trans fats, simple carbohydrates and processed foods, consider a krill or fish or flaxseed oil cap daily.   Back on fenofibrate

## 2022-09-17 NOTE — Progress Notes (Unsigned)
@Patient  ID: Kerby Nora, female    DOB: May 10, 1961, 62 y.o.   MRN: [id number]  Chief Complaint  Patient presents with   Consult    OSA. Last OV 2020 - [place].  CPAP broken.  Last used May 2023.  Snoring again.    Referring provider: Ann Held, *  HPI: 62 year old female, never smoked. PMH significant for OSA on CPAP, mitral valve disease, mild intermittent asthma, allergic rhinitis, GAD, hyperlipidemia.  09/17/2022- Interim hx  Patient presents today for sleep consults. Former patient of Dr. Elsworth Soho, last seen in December 2020. Diagnosed with sleep apnea in 2018. Her current CPAP machine is broken. She had lost 35 lbs and stopped wearing CPAP around July 2023. She has gained back 15 lbs. Her husband has told her that she snores loudly, this is worse early morning. She did sleep well when she wore CPAP and noticed significant improvement in sleep when wearing. DME is [facility].  She is interested in oral appliance potentially. She takes Trazodone 50mg  at bedtime for insomnia along with benedryl for allergic rhinitis symptoms.    Sleep questionnaire Symptoms- loud snoring, daytime sleepiness  Prior sleep study- 2018 mild OSA, AHI 6/hour (REM AHI- 20) Bedtime- 10-11pm Time to fall asleep- 1 hour Nocturnal awakenings- once  Out of bed/start of day- 7-8am Weight changes- down 10 lbs from previous sleep study  Do you operate heavy machinery- no Do you currently wear CPAP- not currently  Do you current wear oxygen- no [facility]worth- 1  Allergies  Allergen Reactions   Doxycycline Hives and Rash   Codeine Nausea And Vomiting   Hydrocodone Bit-Homatrop Mbr Nausea And Vomiting   Hydrocodone-Acetaminophen Nausea And Vomiting    Immunization History  Administered Date(s) Administered   Influenza Whole 02/26/2010, 04/15/2011   Influenza,inj,Quad PF,6+ Mos 03/11/2019   Influenza-Unspecified 04/17/2014   PFIZER(Purple Top)SARS-COV-2 Vaccination 08/30/2019, 09/22/2019    Respiratory Syncytial Virus Vaccine,Recomb Aduvanted(Arexvy) 06/20/2022   Tdap 11/25/2013   Zoster Recombinat (Shingrix) 07/16/2018, 12/30/2018    Past Medical History:  Diagnosis Date   Allergic rhinitis    Anemia, iron deficiency    Anxiety    Asthma    Hearing loss    Heart murmur    Hyperlipidemia    Low back pain syndrome    Mitral valve prolapse    Sinus headache    Sleep apnea    wears CPAP   Snoring     Tobacco History: Social History   Tobacco Use  Smoking Status Never  Smokeless Tobacco Never   Counseling given: Not Answered   Outpatient Medications Prior to Visit  Medication Sig Dispense Refill   albuterol (VENTOLIN HFA) 108 (90 Base) MCG/ACT inhaler Inhale 2 puffs into the lungs every 6 (six) hours as needed for wheezing or shortness of breath. 1 each 3   azelastine (ASTELIN) 0.1 % nasal spray 1-2 sprays each nostril up to 2 times daily as needed for drainage. 30 mL 12   Cholecalciferol (VITAMIN D) 50 MCG (2000 UT) tablet Take 2,000 Units by mouth at bedtime.     diphenhydrAMINE (BENADRYL) 25 MG tablet Take 25 mg by mouth at bedtime as needed for sleep or allergies.     fenofibrate (TRICOR) 145 MG tablet Take 1 tablet (145 mg total) by mouth daily. 90 tablet 1   fluticasone (FLONASE) 50 MCG/ACT nasal spray Place 2 sprays into both nostrils daily. 16 g 5   loratadine (CLARITIN) 10 MG tablet Take 1 tablet (10 mg total)  by mouth daily. 30 tablet 0   naproxen (NAPROSYN) 500 MG tablet TAKE 1 TABLET BY MOUTH TWICE DAILY AS NEEDED WITH FOOD FOR MODERATE PAIN 180 tablet 0   PARoxetine (PAXIL) 10 MG tablet TAKE 1 & 1/2 (ONE & ONE-HALF) TABLETS BY MOUTH ONCE DAILY 135 tablet 3   traZODone (DESYREL) 50 MG tablet TAKE 1 TABLET BY MOUTH AT BEDTIME AS NEEDED FOR SLEEP 90 tablet 0   No facility-administered medications prior to visit.   Review of Systems  Review of Systems  Constitutional: Negative.   HENT: Negative.    Respiratory: Negative.    Cardiovascular:  Negative.    Physical Exam  BP 124/82 (BP Location: Left Arm, Patient Position: Sitting, Cuff Size: Normal)   Pulse 74   Temp 98.2 F (36.8 C) (Oral)   Ht 5\' 1"  (1.549 m)   Wt 156 lb 3.2 oz (70.9 kg)   LMP 09/29/2013 (LMP Unknown)   SpO2 96%   BMI 29.51 kg/m  Physical Exam Constitutional:      General: She is not in acute distress.    Appearance: Normal appearance. She is not ill-appearing.  HENT:     Head: Normocephalic and atraumatic.  Cardiovascular:     Rate and Rhythm: Normal rate and regular rhythm.  Pulmonary:     Effort: Pulmonary effort is normal.     Breath sounds: Normal breath sounds.  Musculoskeletal:        General: Normal range of motion.  Skin:    General: Skin is warm and dry.  Neurological:     General: No focal deficit present.     Mental Status: She is alert and oriented to person, place, and time. Mental status is at baseline.  Psychiatric:        Mood and Affect: Mood normal.        Behavior: Behavior normal.        Thought Content: Thought content normal.        Judgment: Judgment normal.      Lab Results:  CBC    Component Value Date/Time   WBC 5.4 05/20/2022 0916   RBC 4.39 05/20/2022 0916   HGB 13.9 05/20/2022 0916   HCT 40.9 05/20/2022 0916   PLT 312.0 05/20/2022 0916   MCV 93.1 05/20/2022 0916   MCV 89.7 08/20/2015 1458   MCH 30.9 12/07/2018 1057   MCHC 33.9 05/20/2022 0916   RDW 13.2 05/20/2022 0916   LYMPHSABS 1.8 05/20/2022 0916   MONOABS 0.3 05/20/2022 0916   EOSABS 0.2 05/20/2022 0916   BASOSABS 0.0 05/20/2022 0916    BMET    Component Value Date/Time   NA 138 09/17/2022 0954   K 4.6 09/17/2022 0954   CL 102 09/17/2022 0954   CO2 31 09/17/2022 0954   GLUCOSE 82 09/17/2022 0954   BUN 16 09/17/2022 0954   CREATININE 0.92 09/17/2022 0954   CREATININE 0.77 11/25/2013 1033   CALCIUM 9.9 09/17/2022 0954   GFRNONAA >60 12/07/2018 1057   GFRAA >60 12/07/2018 1057    BNP No results found for: "BNP"  ProBNP No  results found for: "PROBNP"  Imaging: No results found.   Assessment & Plan:   OSA on CPAP - Hx mild OSA, dx in 2018. She was on CPAP for several years but stopped wearing in July 2023 after losing 35 lbs. She did find CPAP beneficial for sleep quality. Her current CPAP machine is broken. She has symptoms of loud snoring. She will need repeat home sleep study  to re-evaluate OSA. We reviewed risks of untreated sleep apnea and treatments options, she is interested possibly in oral appliance vs re-starting CPAP. Advised she focus on side sleeping position and avoid alcohol/sedatives prior to bedtime. FU 1-2 weeks after sleep study to review results.   Allergic rhinitis - She deals with chronic sinusitis symptoms. Not actively flared today. She takes loratadine daily and benadryl at bedtime. Recommend she trial off regular diphenhydramine and use nasal spray for symptom management.   Insomnia - Well controlled on Trazodone 50mg  qhs. No residual grogginess. - No changes needed today    Martyn Ehrich, NP 09/17/2022

## 2022-09-17 NOTE — Progress Notes (Signed)
Subjective:   By signing my name below, I, Shehryar Baig, attest that this documentation has been prepared under the direction and in the presence of Ann Held, DO. 09/17/2022   Patient ID: Brianna Carroll, female    DOB: May 20, 1961, 62 y.o.   MRN: [id number]  Chief Complaint  Patient presents with   Hyperlipidemia   Follow-up    Hyperlipidemia Associated symptoms include shortness of breath (occasional). Pertinent negatives include no chest pain.   Patient is in today for a follow up visit.   Her breathing has been giving her problems occasionally. She has a chronic runny nose. She denies using azelastine. She admits to having two dogs and one cat and thinks them brining pollen in her house is contributing to her symptoms.  She has a follow up with her neurologist due to her memory loss and thinks her medication is contributing to her symptoms.  Her blood pressure is doing well today.  BP Readings from Last 3 Encounters:  09/17/22 120/78  06/20/22 125/61  05/20/22 112/80   Pulse Readings from Last 3 Encounters:  09/17/22 61  06/20/22 64  05/20/22 62   She continues taking 145 mg fenofibrate.  Lab Results  Component Value Date   CHOL 335 (H) 05/20/2022   HDL 56.20 05/20/2022   LDLCALC 116 (H) 10/02/2020   LDLDIRECT 245.0 05/20/2022   TRIG 219.0 (H) 05/20/2022   CHOLHDL 6 05/20/2022    Past Medical History:  Diagnosis Date   Allergic rhinitis    Anemia, iron deficiency    Anxiety    Asthma    Hearing loss    Heart murmur    Hyperlipidemia    Low back pain syndrome    Mitral valve prolapse    Sinus headache    Sleep apnea    wears CPAP   Snoring     Past Surgical History:  Procedure Laterality Date   BREAST REDUCTION SURGERY Bilateral 12/10/2018   Procedure: BILATERAL MAMMARY REDUCTION  (BREAST);  Surgeon: Crissie Reese, MD;  Location: [facility];  Service: Plastics;  Laterality: Bilateral;   CESAREAN SECTION  11/90, 12/95   LUMBAR DISC SURGERY  2004    REDUCTION MAMMAPLASTY Bilateral 12/10/2018   TUBAL LIGATION  05/30/94    Family History  Problem Relation Age of Onset   Cancer Father        Lung    Cancer Mother        Lung Cancer   Cancer Brother        throat cancer-heavy smoker   COPD Sister    Asthma Son    Allergic rhinitis Son    Allergic rhinitis Son     Social History   Socioeconomic History   Marital status: Married    Spouse name: [name]   Number of children: 2   Years of education: Not on file   Highest education level: Associate degree: academic program  Occupational History   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   Tobacco Use   Smoking status: Never   Smokeless tobacco: Never  Vaping Use   Vaping Use: Never used  Substance and Sexual Activity   Alcohol use: Yes    Alcohol/week: 1.0 standard drink of alcohol    Types: 1 Standard drinks or equivalent per week   Drug use: No   Sexual activity: Yes    Partners: Male    Birth control/protection: Surgical    Comment:  BTL  Other Topics Concern   Not on file  Social History Narrative   Not on file   Social Determinants of Health   Financial Resource Strain: Low Risk  (09/16/2022)   Overall Financial Resource Strain (CARDIA)    Difficulty of Paying Living Expenses: Not hard at all  Food Insecurity: No Food Insecurity (09/16/2022)   Hunger Vital Sign    Worried About Running Out of Food in the Last Year: Never true    Ran Out of Food in the Last Year: Never true  Transportation Needs: No Transportation Needs (09/16/2022)   PRAPARE - Hydrologist (Medical): No    Lack of Transportation (Non-Medical): No  Physical Activity: Unknown (09/16/2022)   Exercise Vital Sign    Days of Exercise per Week: Patient declined    Minutes of Exercise per Session: Not on file  Stress: No Stress Concern Present (09/16/2022)   [place]    Feeling of Stress : Only a little  Social Connections: Socially Integrated (09/16/2022)   Social Connection and Isolation Panel [NHANES]    Frequency of Communication with Friends and Family: Twice a week    Frequency of Social Gatherings with Friends and Family: Once a week    Attends Religious Services: More than 4 times per year    Active Member of [employer] or Organizations: Yes    Attends Archivist Meetings: 1 to 4 times per year    Marital Status: Married  Human resources officer Violence: Not on file    Outpatient Medications Prior to Visit  Medication Sig Dispense Refill   albuterol (VENTOLIN HFA) 108 (90 Base) MCG/ACT inhaler Inhale 2 puffs into the lungs every 6 (six) hours as needed for wheezing or shortness of breath. 1 each 3   Cholecalciferol (VITAMIN D) 50 MCG (2000 UT) tablet Take 2,000 Units by mouth at bedtime.     fenofibrate (TRICOR) 145 MG tablet Take 1 tablet (145 mg total) by mouth daily. 90 tablet 1   fluticasone (FLONASE) 50 MCG/ACT nasal spray Place 2 sprays into both nostrils daily. 16 g 5   loratadine (CLARITIN) 10 MG tablet Take 1 tablet (10 mg total) by mouth daily. 30 tablet 0   naproxen (NAPROSYN) 500 MG tablet TAKE 1 TABLET BY MOUTH TWICE DAILY AS NEEDED WITH FOOD FOR MODERATE PAIN 180 tablet 0   PARoxetine (PAXIL) 10 MG tablet TAKE 1 & 1/2 (ONE & ONE-HALF) TABLETS BY MOUTH ONCE DAILY 135 tablet 3   traZODone (DESYREL) 50 MG tablet TAKE 1 TABLET BY MOUTH AT BEDTIME AS NEEDED FOR SLEEP 90 tablet 0   azelastine (ASTELIN) 0.1 % nasal spray 1-2 sprays each nostril up to 2 times daily as needed for drainage. 30 mL 12   rosuvastatin (CRESTOR) 10 MG tablet Take 1 tablet (10 mg total) by mouth at bedtime. 30 tablet 2   RSV vaccine recomb adjuvanted (AREXVY) 120 MCG/0.5ML injection Inject into the muscle. (Patient not taking: Reported on 09/17/2022) 0.5 mL 0   No facility-administered medications prior to visit.    Allergies  Allergen Reactions    Doxycycline Hives and Rash   Codeine Nausea And Vomiting   Hydrocodone Bit-Homatrop Mbr Nausea And Vomiting   Hydrocodone-Acetaminophen Nausea And Vomiting    Review of Systems  Constitutional:  Negative for fever and malaise/fatigue.  HENT:  Negative for congestion.        (+)rhinorrhea  Eyes:  Negative for blurred  vision.  Respiratory:  Positive for shortness of breath (occasional).   Cardiovascular:  Negative for chest pain, palpitations and leg swelling.  Gastrointestinal:  Negative for abdominal pain, blood in stool and nausea.  Genitourinary:  Negative for dysuria and frequency.  Musculoskeletal:  Negative for falls.  Skin:  Negative for rash.  Neurological:  Negative for dizziness, loss of consciousness and headaches.  Endo/Heme/Allergies:  Negative for environmental allergies.  Psychiatric/Behavioral:  Positive for memory loss. Negative for depression. The patient is not nervous/anxious.        Objective:    Physical Exam Vitals and nursing note reviewed.  Constitutional:      General: She is not in acute distress.    Appearance: Normal appearance. She is well-developed. She is not ill-appearing.  HENT:     Head: Normocephalic and atraumatic.     Right Ear: External ear normal.     Left Ear: External ear normal.  Eyes:     Extraocular Movements: Extraocular movements intact.     Conjunctiva/sclera: Conjunctivae normal.     Pupils: Pupils are equal, round, and reactive to light.  Neck:     Thyroid: No thyromegaly.     Vascular: No carotid bruit or JVD.  Cardiovascular:     Rate and Rhythm: Normal rate and regular rhythm.     Heart sounds: Normal heart sounds. No murmur heard.    No gallop.  Pulmonary:     Effort: Pulmonary effort is normal. No respiratory distress.     Breath sounds: Normal breath sounds. No wheezing or rales.  Chest:     Chest wall: No tenderness.  Musculoskeletal:     Cervical back: Normal range of motion and neck supple.  Skin:     General: Skin is warm and dry.  Neurological:     Mental Status: She is alert and oriented to person, place, and time.  Psychiatric:        Judgment: Judgment normal.     BP 120/78 (BP Location: Left Arm, Patient Position: Sitting, Cuff Size: Normal)   Pulse 61   Temp 97.8 F (36.6 C) (Oral)   Resp 16   Ht 5\' 1"  (1.549 m)   Wt 154 lb 9.6 oz (70.1 kg)   LMP 09/29/2013 (LMP Unknown)   SpO2 98%   BMI 29.21 kg/m  Wt Readings from Last 3 Encounters:  09/17/22 154 lb 9.6 oz (70.1 kg)  06/20/22 147 lb 12.8 oz (67 kg)  05/20/22 144 lb 6.4 oz (65.5 kg)       Assessment & Plan:  Seasonal allergies -     Azelastine HCl; 1-2 sprays each nostril up to 2 times daily as needed for drainage.  Dispense: 30 mL; Refill: 12  Hyperlipidemia, unspecified hyperlipidemia type Assessment & Plan: Encourage heart healthy diet such as MIND or DASH diet, increase exercise, avoid trans fats, simple carbohydrates and processed foods, consider a krill or fish or flaxseed oil cap daily.   Back on fenofibrate  Orders: -     Comprehensive metabolic panel -     Lipid panel  Seasonal allergic rhinitis due to other allergic trigger Assessment & Plan: Con't flonase and add astelin  Con't antihistamine  F/u as needed      I, Ann Held, DO, personally preformed the services described in this documentation.  All medical record entries made by the scribe were at my direction and in my presence.  I have reviewed the chart and discharge instructions (if applicable) and agree  that the record reflects my personal performance and is accurate and complete. 09/17/2022   I,Shehryar Baig,acting as a scribe for Ann Held, DO.,have documented all relevant documentation on the behalf of Ann Held, DO,as directed by  Ann Held, DO while in the presence of Ann Held, DO.   Ann Held, DO

## 2022-09-19 ENCOUNTER — Encounter: Payer: Self-pay | Admitting: Family Medicine

## 2022-09-19 MED ORDER — ROSUVASTATIN CALCIUM 10 MG PO TABS: 10.0000 mg | ORAL_TABLET | Freq: Every day | ORAL | 2 refills | Status: DC

## 2022-09-20 MED ORDER — ROSUVASTATIN CALCIUM 20 MG PO TABS: 20.0000 mg | ORAL_TABLET | Freq: Every day | ORAL | 0 refills | Status: DC

## 2022-09-20 NOTE — Addendum Note (Signed)
Addended by: Conrad [place] D on: 09/20/2022 11:11 AM   Modules accepted: Orders

## 2022-09-22 NOTE — Progress Notes (Signed)
Reviewed and agree with assessment/plan.   Aunesty Tyson, MD [facility] 09/22/2022, 9:55 AM Pager:  [phone]  

## 2022-09-23 ENCOUNTER — Ambulatory Visit: Payer: BC Managed Care – PPO | Admitting: Neurology

## 2022-09-23 ENCOUNTER — Encounter: Payer: Self-pay | Admitting: Neurology

## 2022-09-23 VITALS — BP 118/76 | HR 62 | Ht 61.0 in | Wt 154.0 lb

## 2022-09-23 DIAGNOSIS — R413 Other amnesia: Secondary | ICD-10-CM

## 2022-09-23 DIAGNOSIS — R7309 Other abnormal glucose: Secondary | ICD-10-CM

## 2022-09-23 DIAGNOSIS — R6889 Other general symptoms and signs: Secondary | ICD-10-CM

## 2022-09-23 DIAGNOSIS — R4189 Other symptoms and signs involving cognitive functions and awareness: Secondary | ICD-10-CM

## 2022-09-23 DIAGNOSIS — R41840 Attention and concentration deficit: Secondary | ICD-10-CM

## 2022-09-23 DIAGNOSIS — E538 Deficiency of other specified B group vitamins: Secondary | ICD-10-CM

## 2022-09-23 DIAGNOSIS — E519 Thiamine deficiency, unspecified: Secondary | ICD-10-CM

## 2022-09-23 DIAGNOSIS — Z818 Family history of other mental and behavioral disorders: Secondary | ICD-10-CM

## 2022-09-23 NOTE — Patient Instructions (Addendum)
-   MRI brain w/wo contrast - Basic labwork - Formal memory testing may be adhd or other undiagnosed issue but also I think nightly benadryl and not using cpap is contributory as well as well as anxiety and not wearing hearing amplifiers; Formal Memory Testing: Dr. Clayborn Heron or Dr. Eileen Stanford Renfroe - Follow up after we get the testing back   Memory Compensation Strategies  Use "WARM" strategy.  W= write it down  A= associate it  R= repeat it  M= make a mental note  2.   You can keep a Glass blower/designer.  Use a 3-ring notebook with sections for the following: calendar, important names and phone numbers,  medications, doctors' names/phone numbers, lists/reminders, and a section to journal what you did  each day.   3.    Use a calendar to write appointments down.  4.    Write yourself a schedule for the day.  This can be placed on the calendar or in a separate section of the Memory Notebook.  Keeping a  regular schedule can help memory.  5.    Use medication organizer with sections for each day or morning/evening pills.  You may need help loading it  6.    Keep a basket, or pegboard by the door.  Place items that you need to take out with you in the basket or on the pegboard.  You may also want to  include a message board for reminders.  7.    Use sticky notes.  Place sticky notes with reminders in a place where the task is performed.  For example: " turn off the  stove" placed by the stove, "lock the door" placed on the door at eye level, " take your medications" on  the bathroom mirror or by the place where you normally take your medications.  8.    Use alarms/timers.  Use while cooking to remind yourself to check on food or as a reminder to take your medicine, or as a  reminder to make a call, or as a reminder to perform another task, etc.

## 2022-09-23 NOTE — Progress Notes (Unsigned)
[facility]    Provider:  Dr Lucia Gaskins Requesting Provider: Jerene Bears, MD Primary Care Provider:  Zola Button, Grayling Congress, DO  CC:  Memory changes  HPI:  Brianna Carroll is a 62 y.o. female here as requested by Jerene Bears, MD for memory loss. has Hyperlipidemia; Generalized anxiety disorder; HEARING LOSS; Mitral valve disease; LOW BACK PAIN SYNDROME; OSA on CPAP; Mid back pain; Allergic rhinitis; Insomnia; Allergic conjunctivitis; Mild intermittent asthma; Breast hypertrophy in female; Tinnitus of both ears; Hypertriglyceridemia; and Left lower quadrant abdominal pain on their problem list. She is married to someone who remembers everyone. She had a lot traumatic child incidents and so she can't remember somethings from her past, she counds everything, like OCD, she counts her walks, she counts everything, she counts her steps even to the kitchen, ongoing for as long as she can remember from the age of 55, if she try to stop it bothers her and she has to start counting. Son does it as well. 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 She will go to make hersekf tea then forget. On cpap and it broke in August and since then worse. She is getting that taken care of. She also has anxiety and depression. Takes benadryl and trazodone every night. No chronic back pain. There is some recent stress as well. She performs all her own IADLs and ADLs. Problem with multitasking. No other focal neurologic deficits, associated symptoms,  inciting events or modifiable factors.  Reviewed notes, labs and imaging from outside physicians, which showed:     Latest Ref Rng & Units 05/20/2022    9:16 AM 12/19/2020    6:30 PM 03/11/2019   10:51 AM  CBC  WBC 4.0 - 10.5 K/uL 5.4  11.4  5.5   Hemoglobin 12.0 - 15.0 g/dL 64.6  80.3  21.2   Hematocrit 36.0 - 46.0 % 40.9  37.0  38.5   Platelets 150.0 - 400.0 K/uL 312.0  395.0  279.0       Latest Ref Rng & Units 09/17/2022    9:54 AM 05/20/2022    9:16 AM 12/20/2020   10:35 AM  CMP  Glucose 70 - 99 mg/dL 82  78  248   BUN 6 - 23 mg/dL 16  13  16    Creatinine 0.40 - 1.20 mg/dL 2.50  0.37  0.48   Sodium 135 - 145 mEq/L 138  139  139   Potassium 3.5 - 5.1 mEq/L 4.6  4.4  4.1   Chloride 96 - 112 mEq/L 102  103  102   CO2 19 - 32 mEq/L 31  30  28    Calcium 8.4 - 10.5 mg/dL 9.9  9.5  9.8   Total Protein 6.0 - 8.3 g/dL 6.7  6.6  7.9   Total Bilirubin 0.2 - 1.2 mg/dL 0.3  0.4  0.4   Alkaline Phos 39 - 117 U/L 29  43  39   AST 0 - 37 U/L 20  19  31   ALT 0 - 35 U/L 18  16  27       Review of Systems: Patient complains of symptoms per HPI as well as the following symptoms memory loss. Pertinent negatives and positives per HPI. All others negative.   Social History   Socioeconomic History   Marital status: Married    Spouse name: Barbara Cower   Number of children: 2   Years of education: Not on file   Highest education level: Associate degree: academic program  Occupational History   Occupation: Secretary/administrator: [employer]    Comment: retired   Tobacco Use   Smoking status: Never   Smokeless tobacco: Never  Vaping Use   Vaping Use: Never used  Substance and Sexual Activity   Alcohol use: Yes    Alcohol/week: 1.0 standard drink of alcohol    Types: 1 Standard drinks or equivalent per week   Drug use: No   Sexual activity: Yes    Partners: Male    Birth control/protection: Surgical    Comment: BTL  Other Topics Concern   Not on file  Social History  Narrative   Not on file   Social Determinants of Health   Financial Resource Strain: Low Risk  (09/16/2022)   Overall Financial Resource Strain (CARDIA)    Difficulty of Paying Living Expenses: Not hard at all  Food Insecurity: No Food Insecurity (09/16/2022)   Hunger Vital Sign    Worried About Running Out of Food in the Last Year: Never true    Ran Out of Food in the Last Year: Never true  Transportation Needs: No Transportation Needs (09/16/2022)   PRAPARE - Administrator, Civil Service (Medical): No    Lack of Transportation (Non-Medical): No  Physical Activity: Unknown (09/16/2022)   Exercise Vital Sign    Days of Exercise per Week: Patient declined    Minutes of Exercise per Session: Not on file  Stress: No Stress Concern Present (09/16/2022)   [employer] of Occupational Health - Occupational Stress Questionnaire    Feeling of Stress : Only a little  Social Connections: Socially Integrated (09/16/2022)   Social Connection and Isolation Panel [NHANES]    Frequency of Communication with Friends and Family: Twice a week    Frequency of Social Gatherings with Friends and Family: Once a week    Attends Religious Services: More than 4 times per year    Active Member of [employer] or Organizations: Yes    Attends Banker Meetings: 1 to 4 times per year    Marital Status: Married  Catering manager Violence: Not on file    Family History  Problem Relation Age of Onset   Cancer Mother        Lung Cancer   Cancer Father        Lung    COPD Sister    Cancer Brother        throat cancer-heavy smoker   Memory loss Paternal Grandmother    Asthma Son    Allergic rhinitis Son    Allergic rhinitis Son     Past Medical History:  Diagnosis Date   Allergic rhinitis    Anemia, iron deficiency    Anxiety    Asthma    Hearing loss    Heart murmur    Hyperlipidemia    Low back pain syndrome    Mitral valve prolapse    Sinus headache    Sleep apnea  wears  CPAP   Snoring     Patient Active Problem List   Diagnosis Date Noted   Left lower quadrant abdominal pain 12/19/2020   Hypertriglyceridemia 02/07/2020   Tinnitus of both ears 11/09/2019   Breast hypertrophy in female 12/10/2018   Allergic conjunctivitis 08/17/2018   Mild intermittent asthma 08/17/2018   Insomnia 07/29/2017   Mid back pain 08/02/2014   Allergic rhinitis 08/02/2014   HEARING LOSS 06/25/2007   OSA on CPAP 06/25/2007   Hyperlipidemia 06/24/2007   Generalized anxiety disorder 06/24/2007   Mitral valve disease 06/24/2007   LOW BACK PAIN SYNDROME 06/24/2007    Past Surgical History:  Procedure Laterality Date   BREAST REDUCTION SURGERY Bilateral 12/10/2018   Procedure: BILATERAL MAMMARY REDUCTION  (BREAST);  Surgeon: Etter Sjogren, MD;  Location: [facility] OR;  Service: Plastics;  Laterality: Bilateral;   CESAREAN SECTION  11/90, 12/95   LUMBAR DISC SURGERY  2004   REDUCTION MAMMAPLASTY Bilateral 12/10/2018   TUBAL LIGATION  05/30/94    Current Outpatient Medications  Medication Sig Dispense Refill   albuterol (VENTOLIN HFA) 108 (90 Base) MCG/ACT inhaler Inhale 2 puffs into the lungs every 6 (six) hours as needed for wheezing or shortness of breath. 1 each 3   azelastine (ASTELIN) 0.1 % nasal spray 1-2 sprays each nostril up to 2 times daily as needed for drainage. 30 mL 12   Cholecalciferol (VITAMIN D) 50 MCG (2000 UT) tablet Take 2,000 Units by mouth at bedtime.     diphenhydrAMINE (BENADRYL) 25 MG tablet Take 25 mg by mouth at bedtime as needed for sleep or allergies.     fenofibrate (TRICOR) 145 MG tablet Take 1 tablet (145 mg total) by mouth daily. 90 tablet 1   fluticasone (FLONASE) 50 MCG/ACT nasal spray Place 2 sprays into both nostrils daily. 16 g 5   loratadine (CLARITIN) 10 MG tablet Take 1 tablet (10 mg total) by mouth daily. 30 tablet 0   naproxen (NAPROSYN) 500 MG tablet TAKE 1 TABLET BY MOUTH TWICE DAILY AS NEEDED WITH FOOD FOR MODERATE PAIN 180 tablet 0    PARoxetine (PAXIL) 10 MG tablet TAKE 1 & 1/2 (ONE & ONE-HALF) TABLETS BY MOUTH ONCE DAILY 135 tablet 3   rosuvastatin (CRESTOR) 20 MG tablet Take 1 tablet (20 mg total) by mouth at bedtime. 90 tablet 0   traZODone (DESYREL) 50 MG tablet TAKE 1 TABLET BY MOUTH AT BEDTIME AS NEEDED FOR SLEEP 90 tablet 0   No current facility-administered medications for this visit.    Allergies as of 09/23/2022 - Review Complete 09/23/2022  Allergen Reaction Noted   Doxycycline Hives and Rash    Codeine Nausea And Vomiting    Hydrocodone bit-homatrop mbr Nausea And Vomiting    Hydrocodone-acetaminophen Nausea And Vomiting     Vitals: BP 118/76   Pulse 62   Ht 5\' 1"  (1.549 m)   Wt 154 lb (69.9 kg)   LMP 09/29/2013 (LMP Unknown)   BMI 29.10 kg/m  Last Weight:  Wt Readings from Last 1 Encounters:  09/23/22 154 lb (69.9 kg)   Last Height:   Ht Readings from Last 1 Encounters:  09/23/22 5\' 1"  (1.549 m)     Physical exam: Exam: Gen: NAD, conversant, well nourised, obese, well groomed                     CV: RRR, no MRG. No Carotid Bruits. No peripheral edema, warm, nontender Eyes: Conjunctivae clear without exudates or hemorrhage  Neuro: Detailed Neurologic Exam  Speech:    Speech is normal; fluent and spontaneous with normal comprehension.  Cognition:     09/23/2022   11:29 AM  MMSE - Mini Mental State Exam  Orientation to time 5  Orientation to Place 5  Registration 3  Attention/ Calculation 2  Recall 1  Language- name 2 objects 1  Language- repeat 1  Language- follow 3 step command 3  Language- read & follow direction 1  Write a sentence 1  Copy design 1  Total score 24       The patient is oriented to person, place, and time;     recent and remote memory intact;     language fluent;     normal attention, concentration,     fund of knowledge Cranial Nerves:    The pupils are equal, round, and reactive to light. The fundi are flat. Visual fields are full to finger  confrontation. Extraocular movements are intact. Trigeminal sensation is intact and the muscles of mastication are normal. The face is symmetric. The palate elevates in the midline. Hearing intact. Voice is normal. Shoulder shrug is normal. The tongue has normal motion without fasciculations.   Coordination:    Normal finger to nose and heel to shin.   Gait: nml  Motor Observation:    No asymmetry, no atrophy, and no involuntary movements noted. Tone:    Normal muscle tone.    Posture:    Posture is normal. normal erect    Strength:    Strength is V/V in the upper and lower limbs.      Sensation: intact to LT     Reflex Exam:  DTR's:    Deep tendon reflexes in the upper and lower extremities are symmetrical bilaterally.   Toes:    The toes are downgoing bilaterally.   Clonus:    Clonus is absent.    Assessment/Plan:   Brianna Carroll is a 62 y.o. female here as requested by Jerene Bears, MD for memory loss. has Hyperlipidemia; Generalized anxiety disorder; HEARING LOSS; Mitral valve disease; LOW BACK PAIN SYNDROME; OSA on CPAP; Mid back pain; Allergic rhinitis; Insomnia; Allergic conjunctivitis; Mild intermittent asthma; Breast hypertrophy in female; Tinnitus of both ears; Hypertriglyceridemia; and Left lower quadrant abdominal pain on their problem list. MMSE 24/30.   Likely multifactorial:  Stop Benadryl: A report published in JAMA Internal [facility] several years ago highlighted a link between long-term use of anticholinergic medications like Benadryl and dementia. Hasn't used her cpap since August, this can also be a big contributor or even the reason She has anxiety, untreated, recommend addressing Not wearing hearing amplifiers - there is an association between hearing and memory  Plan:  - MRI brain w/wo contrast for reversible causes of cognitive decline - Basic labwork - Formal memory testing may be adhd or other undiagnosed issue but also I think nightly benadryl  and not using cpap is contributory as well as well as anxiety and not wearing hearing amplifiers; Formal Memory Testing: Dr. Clayborn Heron  for: The primary encounter diagnosis was Cognitive impairment. Diagnoses of Memory loss, screen for B12 deficiency, screen for Vitamin B1 deficiency, Elevated glucose, Difficulty with multi tasking, Short-term memory loss, FHx: dementia, and Attention and concentration deficit were also pertinent to this visit. (Dr Clayborn Heron please for memory loss. Please schedule out several months to let patient get her cpap back in use.) - Follow up after we get the testing back   Orders Placed  This Encounter  Procedures   MR BRAIN W WO CONTRAST   B12 and Folate Panel   TSH Rfx on Abnormal to Free T4   Methylmalonic acid, serum   Vitamin B1   RPR   Ammonia   Hemoglobin A1c   Ambulatory referral to Neuropsychology     Discussed:  Memory Compensation Strategies  Use "WARM" strategy.  W= write it down  A= associate it  R= repeat it  M= make a mental note  2.   You can keep a Glass blower/designerMemory Notebook.  Use a 3-ring notebook with sections for the following: calendar, important names and phone numbers,  medications, doctors' names/phone numbers, lists/reminders, and a section to journal what you did  each day.   3.    Use a calendar to write appointments down.  4.    Write yourself a schedule for the day.  This can be placed on the calendar or in a separate section of the Memory Notebook.  Keeping a  regular schedule can help memory.  5.    Use medication organizer with sections for each day or morning/evening pills.  You may need help loading it  6.    Keep a basket, or pegboard by the door.  Place items that you need to take out with you in the basket or on the pegboard.  You may also want to  include a message board for reminders.  7.    Use sticky notes.  Place sticky notes with reminders in a place where the task is performed.  For example: " turn off the   stove" placed by the stove, "lock the door" placed on the door at eye level, " take your medications" on  the bathroom mirror or by the place where you normally take your medications.  8.    Use alarms/timers.  Use while cooking to remind yourself to check on food or as a reminder to take your medicine, or as a  reminder to make a call, or as a reminder to perform another task, etc.   Orders Placed This Encounter  Procedures   MR BRAIN W WO CONTRAST   B12 and Folate Panel   TSH Rfx on Abnormal to Free T4   Methylmalonic acid, serum   Vitamin B1   RPR   Ammonia   Hemoglobin A1c   Ambulatory referral to Neuropsychology   No orders of the defined types were placed in this encounter.    Cc: Jerene BearsMiller, Mary S, MD,  Donato SchultzLowne Chase, Yvonne R, DO  Naomie DeanAntonia Mercy Leppla, MD  [facility] [address] [place] [id number]  Phone [phone] Fax [phone]  I spent over 60 minutes of face-to-face and non-face-to-face time with patient on the  1. Cognitive impairment   2. Memory loss   3. screen for B12 deficiency   4. screen for Vitamin B1 deficiency   5. Elevated glucose   6. Difficulty with multi tasking   7. Short-term memory loss   8. FHx: dementia   9. Attention and concentration deficit    diagnosis.  This included previsit chart review, lab review, study review, order entry, electronic health record documentation, patient education on the different diagnostic and therapeutic options, counseling and coordination of care, risks and benefits of management, compliance, or risk factor reduction

## 2022-09-24 LAB — TSH RFX ON ABNORMAL TO FREE T4: TSH: 2.95 u[IU]/mL (ref 0.450–4.500)

## 2022-09-25 ENCOUNTER — Telehealth: Payer: Self-pay | Admitting: Neurology

## 2022-09-25 NOTE — Telephone Encounter (Signed)
Referral sent to Dr. Roseanne Reno @ [facility], phone # [phone].

## 2022-09-26 ENCOUNTER — Telehealth: Payer: Self-pay | Admitting: Neurology

## 2022-09-26 NOTE — Telephone Encounter (Signed)
Patient will call me if/when she decides to schedule the MRI. She is not sure about spending the money on it right now and said she may wait to see if her c-pap helps her first.

## 2022-09-28 LAB — METHYLMALONIC ACID, SERUM: Methylmalonic Acid: 269 nmol/L (ref 0–378)

## 2022-09-28 LAB — HEMOGLOBIN A1C
Est. average glucose Bld gHb Est-mCnc: 120 mg/dL
Hgb A1c MFr Bld: 5.8 % — ABNORMAL HIGH (ref 4.8–5.6)

## 2022-09-28 LAB — B12 AND FOLATE PANEL
Folate: >20 ng/mL (ref >3.0)
Vitamin B-12: 791 pg/mL (ref 232–1245)

## 2022-09-28 LAB — RPR: RPR Ser Ql: NONREACTIVE

## 2022-09-28 LAB — AMMONIA: Ammonia: 62 ug/dL (ref 34–178)

## 2022-09-28 LAB — VITAMIN B1: Thiamine: 143.4 nmol/L (ref 66.5–200.0)

## 2022-10-04 ENCOUNTER — Telehealth: Payer: Self-pay | Admitting: Primary Care

## 2022-10-04 NOTE — Telephone Encounter (Signed)
Pt still has not heard from [facility]. Order place 4/3. Usually they get with PT sooner so PT wonders why no call yet.  Her # is [phone]

## 2022-11-12 ENCOUNTER — Encounter (INDEPENDENT_AMBULATORY_CARE_PROVIDER_SITE_OTHER): Payer: BC Managed Care – PPO

## 2022-11-12 ENCOUNTER — Telehealth: Payer: Self-pay | Admitting: Pulmonary Disease

## 2022-11-12 DIAGNOSIS — G4733 Obstructive sleep apnea (adult) (pediatric): Secondary | ICD-10-CM | POA: Diagnosis not present

## 2022-11-12 NOTE — Telephone Encounter (Signed)
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  CPAP therapy if there is significant daytime sleepiness or other comorbidities including history of CVA or cardiac disease  -If CPAP is chosen as an option of treatment auto titrating CPAP with a pressure setting of 5-15 will be appropriate  Will recommend CPAP therapy because of the significant oxygen desaturations  2.  Watchful waiting with emphasis on weight loss measures, sleep position modification to optimize lateral sleep, elevating the head of the bed by about 30 degrees may also help snoring but will not address oxygen desaturations.   3.  An oral device may be fashioned for the treatment of mild sleep disordered breathing, will involve referral to dentist and possibly adding oxygen supplementation   Follow-up as previously scheduled

## 2022-11-13 NOTE — Telephone Encounter (Signed)
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

## 2022-11-14 NOTE — Telephone Encounter (Signed)
Sleep study showed mild obstructive sleep apnea, she had on average 8.9 apneic events an hour with SpO2 low 72%.  She spent 7 minutes with an oxygen level less than 88%   She does not need CPAP since OSA was mild but if very symptomatic and wants to trial cpap we can start her on auto cpap 5-15cm h20. Other options would be referral for oral appliance

## 2022-11-18 NOTE — Telephone Encounter (Signed)
LM on VM for patient to call to give sleep study results and make an OV to go over treatment options.

## 2022-11-19 NOTE — Telephone Encounter (Signed)
LM on VM x 2 for patient to call clinic to give results and make return ov.

## 2022-11-20 NOTE — Telephone Encounter (Signed)
Left message x 3 on VM for patient to call regarding sleep study.  Will send MyChart message to patient and close this encounter.

## 2022-12-10 ENCOUNTER — Other Ambulatory Visit (HOSPITAL_BASED_OUTPATIENT_CLINIC_OR_DEPARTMENT_OTHER): Payer: Self-pay | Admitting: Obstetrics & Gynecology

## 2022-12-10 ENCOUNTER — Other Ambulatory Visit: Payer: Self-pay | Admitting: Family Medicine

## 2022-12-10 DIAGNOSIS — G4700 Insomnia, unspecified: Secondary | ICD-10-CM

## 2022-12-10 DIAGNOSIS — E785 Hyperlipidemia, unspecified: Secondary | ICD-10-CM

## 2022-12-30 ENCOUNTER — Ambulatory Visit: Payer: BC Managed Care – PPO | Admitting: Primary Care

## 2023-02-25 ENCOUNTER — Telehealth: Payer: BC Managed Care – PPO | Admitting: Physician Assistant

## 2023-02-25 ENCOUNTER — Ambulatory Visit: Payer: BC Managed Care – PPO | Admitting: Primary Care

## 2023-02-25 DIAGNOSIS — U071 COVID-19: Secondary | ICD-10-CM

## 2023-02-25 MED ORDER — BENZONATATE 100 MG PO CAPS: 100.0000 mg | ORAL_CAPSULE | Freq: Three times a day (TID) | ORAL | 0 refills | Status: DC | PRN

## 2023-02-25 MED ORDER — ALBUTEROL SULFATE HFA 108 (90 BASE) MCG/ACT IN AERS: 2.0000 | INHALATION_SPRAY | Freq: Four times a day (QID) | RESPIRATORY_TRACT | 0 refills | Status: DC | PRN

## 2023-02-25 NOTE — Addendum Note (Signed)
Addended by: Waldon Merl on: 02/25/2023 11:16 AM   Modules accepted: Orders

## 2023-02-25 NOTE — Progress Notes (Addendum)
E-Visit  for Positive Covid Test Result   We are sorry you are not feeling well. We are here to help!  You have tested positive for COVID-19, meaning that you were infected with the novel coronavirus and could give the virus to others.  Most people with COVID-19 have mild illness and can recover at home without medical care. Do not leave your home, except to get medical care. Do not visit public areas and do not go to places where you are unable to wear a mask. It is important that you stay home  to take care for yourself and to help protect other people in your home and community.      Isolation Instructions:   You are to isolate at home until you have been fever free for at least 24 hours without a fever-reducing medication, and symptoms have been steadily improving for 24 hours. At that time,  you can end isolation but need to mask for an additional 5 days.  If you must be around other household members who do not have symptoms, you need to make sure that both you and the family members are masking consistently with a high-quality mask.  If you note any worsening of symptoms despite treatment, please seek an in-person evaluation ASAP. If you note any significant shortness of breath or any chest pain, please seek ER evaluation. Please do not delay care!   Go to the nearest hospital ED for assessment if fever/cough/breathlessness are severe or illness seems like a threat to life.    The following symptoms may appear 2-14 days after exposure: Fever Cough Shortness of breath or difficulty breathing Chills Repeated shaking with chills Muscle pain Headache Sore throat New loss of taste or smell Fatigue Congestion or runny nose Nausea or vomiting Diarrhea  You can use medication such as prescription cough medication called Tessalon Perles 100 mg. You may take 1-2 capsules every 8 hours as needed for cough and  prescription inhaler called Albuterol MDI 90 mcg /actuation 2 puffs every 4  hours as needed for shortness of breath, wheezing, cough Unfortunately you are outside the window where antiviral medications can be started so we need to treat lingering symptoms.  You may also take acetaminophen (Tylenol) as needed for fever.  HOME CARE: Only take medications as instructed by your medical team. Drink plenty of fluids and get plenty of rest. A steam or ultrasonic humidifier can help if you have congestion.   GET HELP RIGHT AWAY IF YOU HAVE EMERGENCY WARNING SIGNS.  Call 911 or proceed to your closest emergency facility if: You develop worsening high fever. Trouble breathing Bluish lips or face Persistent pain or pressure in the chest New confusion Inability to wake or stay awake You cough up blood. Your symptoms become more severe Inability to hold down food or fluids  This list is not all possible symptoms. Contact your medical provider for any symptoms that are severe or concerning to you.   Your e-visit answers were reviewed by a board certified advanced clinical practitioner to complete your personal care plan.  Depending on the condition, your plan could have included both over the counter or prescription medications.  If there is a problem please reply once you have received a response from your provider.  Your safety is important to [place].  If you have drug allergies check your prescription carefully.    You can use MyChart to ask questions about today's visit, request a non-urgent call back, or ask for a  work or school excuse for 24 hours related to this e-Visit. If it has been greater than 24 hours you will need to follow up with your provider, or enter a new e-Visit to address those concerns. You will get an e-mail in the next two days asking about your experience.  I hope that your e-visit has been valuable and will speed your recovery. Thank you for using e-visits.

## 2023-02-25 NOTE — Progress Notes (Signed)
I have spent 5 minutes in review of e-visit questionnaire, review and updating patient chart, medical decision making and response to patient.   William Cody Martin, PA-C    

## 2023-03-06 ENCOUNTER — Other Ambulatory Visit: Payer: Self-pay | Admitting: Family Medicine

## 2023-03-28 ENCOUNTER — Encounter: Payer: Self-pay | Admitting: Family Medicine

## 2023-03-28 ENCOUNTER — Ambulatory Visit: Payer: BC Managed Care – PPO | Admitting: Family Medicine

## 2023-03-28 VITALS — BP 110/78 | HR 78 | Temp 97.7°F | Resp 16 | Ht 61.0 in | Wt 161.0 lb

## 2023-03-28 DIAGNOSIS — E785 Hyperlipidemia, unspecified: Secondary | ICD-10-CM | POA: Diagnosis not present

## 2023-03-28 DIAGNOSIS — J302 Other seasonal allergic rhinitis: Secondary | ICD-10-CM

## 2023-03-28 DIAGNOSIS — U071 COVID-19: Secondary | ICD-10-CM

## 2023-03-28 LAB — COMPREHENSIVE METABOLIC PANEL
ALT: 22 U/L (ref 0–35)
AST: 24 U/L (ref 0–37)
Albumin: 4.4 g/dL (ref 3.5–5.2)
Alkaline Phosphatase: 28 U/L — ABNORMAL LOW (ref 39–117)
BUN: 10 mg/dL (ref 6–23)
CO2: 29 meq/L (ref 19–32)
Calcium: 9.8 mg/dL (ref 8.4–10.5)
Chloride: 104 meq/L (ref 96–112)
Creatinine, Ser: 0.85 mg/dL (ref 0.40–1.20)
GFR: 73.21 mL/min (ref >60.00)
Glucose, Bld: 86 mg/dL (ref 70–99)
Potassium: 4.4 meq/L (ref 3.5–5.1)
Sodium: 141 meq/L (ref 135–145)
Total Bilirubin: 0.3 mg/dL (ref 0.2–1.2)
Total Protein: 6.2 g/dL (ref 6.0–8.3)

## 2023-03-28 LAB — LIPID PANEL
Cholesterol: 248 mg/dL — ABNORMAL HIGH (ref 0–200)
HDL: 52.1 mg/dL (ref >39.00)
LDL Cholesterol: 158 mg/dL — ABNORMAL HIGH (ref 0–99)
NonHDL: 195.46
Total CHOL/HDL Ratio: 5
Triglycerides: 185 mg/dL — ABNORMAL HIGH (ref 0.0–149.0)
VLDL: 37 mg/dL (ref 0.0–40.0)

## 2023-03-28 MED ORDER — FLUTICASONE PROPIONATE 50 MCG/ACT NA SUSP: 2.0000 | Freq: Every day | NASAL | 5 refills | Status: AC

## 2023-03-28 MED ORDER — ROSUVASTATIN CALCIUM 20 MG PO TABS: 20.0000 mg | ORAL_TABLET | Freq: Every day | ORAL | 3 refills | Status: DC

## 2023-03-28 MED ORDER — ALBUTEROL SULFATE HFA 108 (90 BASE) MCG/ACT IN AERS: 2.0000 | INHALATION_SPRAY | Freq: Four times a day (QID) | RESPIRATORY_TRACT | 5 refills | Status: AC | PRN

## 2023-03-28 MED ORDER — AZELASTINE HCL 0.1 % NA SOLN: NASAL | 12 refills | Status: AC

## 2023-03-28 NOTE — Progress Notes (Signed)
Established Patient Office Visit  Subjective   Patient ID: Brianna Carroll, female    DOB: [birth year]  Age: 62 y.o. MRN: [id number]  Chief Complaint  Patient presents with   Hyperlipidemia   Follow-up    HPI Discussed the use of AI scribe software for clinical note transcription with the patient, who gave verbal consent to proceed.  History of Present Illness   The patient, with a history of asthma, presents with foot pain due to bunions and a newly diagnosed hammertoe. The pain, located at the ball of the foot, was severe enough to limit walking during a recent trip to [place]. The patient has not been using her prescribed Astelin nasal spray regularly, which may be contributing to her chronic sinus congestion. She has been self-medicating with Benadryl or NyQuil at night to alleviate the symptoms.  The patient also reports a recent COVID-19 infection, which was managed at home with rest and cough medicine tablets. Her asthma symptoms were exacerbated during the infection, but have since improved. She is currently using an Albuterol inhaler as needed.      Patient Active Problem List   Diagnosis Date Noted   Left lower quadrant abdominal pain 12/19/2020   Hypertriglyceridemia 02/07/2020   Tinnitus of both ears 11/09/2019   Breast hypertrophy in female 12/10/2018   Allergic conjunctivitis 08/17/2018   Mild intermittent asthma 08/17/2018   Insomnia 07/29/2017   Mid back pain 08/02/2014   Allergic rhinitis 08/02/2014   HEARING LOSS 06/25/2007   OSA on CPAP 06/25/2007   Hyperlipidemia 06/24/2007   Generalized anxiety disorder 06/24/2007   Mitral valve disease 06/24/2007   LOW BACK PAIN SYNDROME 06/24/2007   Past Medical History:  Diagnosis Date   Allergic rhinitis    Anemia, iron deficiency    Anxiety    Asthma    Hearing loss    Heart murmur    Hyperlipidemia    Low back pain syndrome    Mitral valve prolapse    Sinus headache    Sleep apnea    wears CPAP   Snoring     Past Surgical History:  Procedure Laterality Date   BREAST REDUCTION SURGERY Bilateral 12/10/2018   Procedure: BILATERAL MAMMARY REDUCTION  (BREAST);  Surgeon: Etter Sjogren, MD;  Location: [facility] OR;  Service: Plastics;  Laterality: Bilateral;   CESAREAN SECTION  11/90, 12/95   LUMBAR DISC SURGERY  2004   REDUCTION MAMMAPLASTY Bilateral 12/10/2018   TUBAL LIGATION  05/30/94   Social History   Tobacco Use   Smoking status: Never   Smokeless tobacco: Never  Vaping Use   Vaping status: Never Used  Substance Use Topics   Alcohol use: Yes    Alcohol/week: 1.0 standard drink of alcohol    Types: 1 Standard drinks or equivalent per week   Drug use: No   Social History   Socioeconomic History   Marital status: Married    Spouse name: Barbara Cower   Number of children: 2   Years of education: Not on file   Highest education level: Associate degree: academic program  Occupational History   Occupation: Secretary/administrator: [employer]    Comment: retired   Tobacco Use   Smoking status: Never   Smokeless tobacco: Never  Vaping Use   Vaping status: Never Used  Substance and Sexual Activity   Alcohol use: Yes    Alcohol/week: 1.0 standard drink of alcohol    Types: 1 Standard drinks or equivalent per week  Drug use: No   Sexual activity: Yes    Partners: Male    Birth control/protection: Surgical    Comment: BTL  Other Topics Concern   Not on file  Social History Narrative   Not on file   Social Determinants of Health   Financial Resource Strain: Low Risk  (09/16/2022)   Overall Financial Resource Strain (CARDIA)    Difficulty of Paying Living Expenses: Not hard at all  Food Insecurity: No Food Insecurity (09/16/2022)   Hunger Vital Sign    Worried About Running Out of Food in the Last Year: Never true    Ran Out of Food in the Last Year: Never true  Transportation Needs: No Transportation Needs (09/16/2022)   PRAPARE - Administrator, Civil Service  (Medical): No    Lack of Transportation (Non-Medical): No  Physical Activity: Unknown (09/16/2022)   Exercise Vital Sign    Days of Exercise per Week: Patient declined    Minutes of Exercise per Session: Not on file  Stress: No Stress Concern Present (09/16/2022)   [employer] of Occupational Health - Occupational Stress Questionnaire    Feeling of Stress : Only a little  Social Connections: Socially Integrated (09/16/2022)   Social Connection and Isolation Panel [NHANES]    Frequency of Communication with Friends and Family: Twice a week    Frequency of Social Gatherings with Friends and Family: Once a week    Attends Religious Services: More than 4 times per year    Active Member of [employer] or Organizations: Yes    Attends Banker Meetings: 1 to 4 times per year    Marital Status: Married  Catering manager Violence: Not on file   Family Status  Relation Name Status   Mother  Deceased at age 16       lung cancer   Father  Deceased at age 76       lung cancer   Sister pam Armed forces logistics/support/administrative officer (Not Specified)   Sister  (Not Specified)   Brother  (Not Specified)   PGM  (Not Specified)   Son Ladona Ridgel Alive   Son  Alive  No partnership data on file   Family History  Problem Relation Age of Onset   Cancer Mother        Lung Cancer   Cancer Father        Lung    COPD Sister    Cancer Brother        throat cancer-heavy smoker   Memory loss Paternal Grandmother    Asthma Son    Allergic rhinitis Son    Allergic rhinitis Son    Allergies  Allergen Reactions   Doxycycline Hives and Rash   Codeine Nausea And Vomiting   Hydrocodone Bit-Homatrop Mbr Nausea And Vomiting   Hydrocodone-Acetaminophen Nausea And Vomiting      ROS    Objective:     BP 110/78 (BP Location: Left Arm, Patient Position: Sitting, Cuff Size: Normal)   Pulse 78   Temp 97.7 F (36.5 C) (Oral)   Resp 16   Ht 5\' 1"  (1.549 m)   Wt 161 lb (73 kg)   LMP 09/29/2013 (LMP Unknown)   SpO2 97%   BMI  30.42 kg/m  BP Readings from Last 3 Encounters:  03/28/23 110/78  09/23/22 118/76  09/17/22 124/82   Wt Readings from Last 3 Encounters:  03/28/23 161 lb (73 kg)  09/23/22 154 lb (69.9 kg)  09/17/22 156 lb 3.2 oz (  70.9 kg)   SpO2 Readings from Last 3 Encounters:  03/28/23 97%  09/17/22 96%  09/17/22 98%      Physical Exam   No results found for any visits on 03/28/23.  Last CBC Lab Results  Component Value Date   WBC 5.4 05/20/2022   HGB 13.9 05/20/2022   HCT 40.9 05/20/2022   MCV 93.1 05/20/2022   MCH 30.9 12/07/2018   RDW 13.2 05/20/2022   PLT 312.0 05/20/2022   Last metabolic panel Lab Results  Component Value Date   GLUCOSE 82 09/17/2022   NA 138 09/17/2022   K 4.6 09/17/2022   CL 102 09/17/2022   CO2 31 09/17/2022   BUN 16 09/17/2022   CREATININE 0.92 09/17/2022   GFR 66.82 09/17/2022   CALCIUM 9.9 09/17/2022   PROT 6.7 09/17/2022   ALBUMIN 4.6 09/17/2022   BILITOT 0.3 09/17/2022   ALKPHOS 29 (L) 09/17/2022   AST 20 09/17/2022   ALT 18 09/17/2022   ANIONGAP 9 12/07/2018   Last lipids Lab Results  Component Value Date   CHOL 286 (H) 09/17/2022   HDL 56.50 09/17/2022   LDLCALC 192 (H) 09/17/2022   LDLDIRECT 245.0 05/20/2022   TRIG 187.0 (H) 09/17/2022   CHOLHDL 5 09/17/2022   Last hemoglobin A1c Lab Results  Component Value Date   HGBA1C 5.8 (H) 09/23/2022   Last thyroid functions Lab Results  Component Value Date   TSH 2.950 09/23/2022   T4TOTAL 7.5 09/18/2015   Last vitamin D Lab Results  Component Value Date   VD25OH 50 11/25/2013   Last vitamin B12 and Folate Lab Results  Component Value Date   VITAMINB12 791 09/23/2022   FOLATE >20.0 09/23/2022      The 10-year ASCVD risk score (Arnett DK, et al., 2019) is: 3.8%    Assessment & Plan:   Problem List Items Addressed This Visit       Unprioritized   Hyperlipidemia - Primary    Encourage heart healthy diet such as MIND or DASH diet, increase exercise, avoid trans  fats, simple carbohydrates and processed foods, consider a krill or fish or flaxseed oil cap daily.        Relevant Medications   rosuvastatin (CRESTOR) 20 MG tablet   Other Relevant Orders   Comprehensive metabolic panel   Lipid panel   Other Visit Diagnoses     COVID-19       Relevant Medications   albuterol (VENTOLIN HFA) 108 (90 Base) MCG/ACT inhaler   Seasonal allergies       Relevant Medications   azelastine (ASTELIN) 0.1 % nasal spray   fluticasone (FLONASE) 50 MCG/ACT nasal spray     Assessment and Plan    Bunion and Hammertoe Painful bunion and hammertoe, affecting mobility. Discussed upcoming foot surgery with Dr. Victorino Dike. -Continue current management plan as directed by Dr. Victorino Dike.  Asthma Stable, no recent exacerbations. Albuterol use infrequent. -Refill Albuterol inhaler.  Allergic Rhinitis Reports of morning congestion. Inconsistent use of Astelin nasal spray. -Use Astelin nasal spray consistently every night. -Refill Astelin prescription.  Hyperlipidemia Crestor prescription lapsed for one month. Continues Fenofibrate. -Refill Crestor prescription. -Address lapse in medication with pharmacy.  COVID-19 Recovered from recent infection. Symptoms managed with cough pearls. -Continue current management plan.  General Health Maintenance -Declined flu shot.        No follow-ups on file.    Donato Schultz, DO

## 2023-03-28 NOTE — Assessment & Plan Note (Signed)
Encourage heart healthy diet such as MIND or DASH diet, increase exercise, avoid trans fats, simple carbohydrates and processed foods, consider a krill or fish or flaxseed oil cap daily.  °

## 2023-04-10 ENCOUNTER — Encounter: Payer: Self-pay | Admitting: Family Medicine

## 2023-04-29 HISTORY — PX: HAMMER TOE SURGERY: SHX385

## 2023-06-07 ENCOUNTER — Other Ambulatory Visit: Payer: Self-pay | Admitting: Family Medicine

## 2023-06-07 DIAGNOSIS — E785 Hyperlipidemia, unspecified: Secondary | ICD-10-CM

## 2023-06-12 ENCOUNTER — Other Ambulatory Visit: Payer: Self-pay | Admitting: Family Medicine

## 2023-06-12 DIAGNOSIS — Z1231 Encounter for screening mammogram for malignant neoplasm of breast: Secondary | ICD-10-CM

## 2023-07-15 ENCOUNTER — Encounter: Payer: Self-pay | Admitting: Family Medicine

## 2023-07-15 ENCOUNTER — Ambulatory Visit: Payer: 59 | Admitting: Family Medicine

## 2023-07-15 ENCOUNTER — Ambulatory Visit
Admission: RE | Admit: 2023-07-15 | Discharge: 2023-07-15 | Disposition: A | Discharge status: Home / Self Care | Payer: 59 | Source: Ambulatory Visit

## 2023-07-15 VITALS — BP 114/82 | HR 65 | Temp 97.8°F | Resp 18 | Ht 61.0 in | Wt 160.2 lb

## 2023-07-15 DIAGNOSIS — Z23 Encounter for immunization: Secondary | ICD-10-CM

## 2023-07-15 DIAGNOSIS — J4520 Mild intermittent asthma, uncomplicated: Secondary | ICD-10-CM | POA: Diagnosis not present

## 2023-07-15 DIAGNOSIS — Z1231 Encounter for screening mammogram for malignant neoplasm of breast: Secondary | ICD-10-CM

## 2023-07-15 DIAGNOSIS — E785 Hyperlipidemia, unspecified: Secondary | ICD-10-CM

## 2023-07-15 LAB — COMPREHENSIVE METABOLIC PANEL
ALT: 18 U/L (ref 0–35)
AST: 21 U/L (ref 0–37)
Albumin: 4.8 g/dL (ref 3.5–5.2)
Alkaline Phosphatase: 33 U/L — ABNORMAL LOW (ref 39–117)
BUN: 20 mg/dL (ref 6–23)
CO2: 30 meq/L (ref 19–32)
Calcium: 9.8 mg/dL (ref 8.4–10.5)
Chloride: 102 meq/L (ref 96–112)
Creatinine, Ser: 0.78 mg/dL (ref 0.40–1.20)
GFR: 80.99 mL/min (ref >60.00)
Glucose, Bld: 85 mg/dL (ref 70–99)
Potassium: 4.4 meq/L (ref 3.5–5.1)
Sodium: 140 meq/L (ref 135–145)
Total Bilirubin: 0.4 mg/dL (ref 0.2–1.2)
Total Protein: 6.8 g/dL (ref 6.0–8.3)

## 2023-07-15 LAB — CBC WITH DIFFERENTIAL/PLATELET
Basophils Absolute: 0 10*3/uL (ref 0.0–0.1)
Basophils Relative: 0.8 % (ref 0.0–3.0)
Eosinophils Absolute: 0.2 10*3/uL (ref 0.0–0.7)
Eosinophils Relative: 3.9 % (ref 0.0–5.0)
HCT: 40.7 % (ref 36.0–46.0)
Hemoglobin: 13.6 g/dL (ref 12.0–15.0)
Lymphocytes Relative: 36.7 % (ref 12.0–46.0)
Lymphs Abs: 1.5 10*3/uL (ref 0.7–4.0)
MCHC: 33.3 g/dL (ref 30.0–36.0)
MCV: 93.6 fL (ref 78.0–100.0)
Monocytes Absolute: 0.3 10*3/uL (ref 0.1–1.0)
Monocytes Relative: 7.2 % (ref 3.0–12.0)
Neutro Abs: 2.1 10*3/uL (ref 1.4–7.7)
Neutrophils Relative %: 51.4 % (ref 43.0–77.0)
Platelets: 330 10*3/uL (ref 150.0–400.0)
RBC: 4.35 Mil/uL (ref 3.87–5.11)
RDW: 13.4 % (ref 11.5–15.5)
WBC: 4.2 10*3/uL (ref 4.0–10.5)

## 2023-07-15 LAB — LIPID PANEL
Cholesterol: 122 mg/dL (ref 0–200)
HDL: 47 mg/dL (ref >39.00)
LDL Cholesterol: 56 mg/dL (ref 0–99)
NonHDL: 75.39
Total CHOL/HDL Ratio: 3
Triglycerides: 95 mg/dL (ref 0.0–149.0)
VLDL: 19 mg/dL (ref 0.0–40.0)

## 2023-07-15 NOTE — Assessment & Plan Note (Signed)
Tolerating statin, encouraged heart healthy diet, avoid trans fats, minimize simple carbs and saturated fats. Increase exercise as tolerated

## 2023-07-15 NOTE — Patient Instructions (Signed)
Cholesterol Content in Foods Cholesterol is a waxy, fat-like substance that helps to carry fat in the blood. The body needs cholesterol in small amounts, but too much cholesterol can cause damage to the arteries and heart. What foods have cholesterol?  Cholesterol is found in animal-based foods, such as meat, seafood, and dairy. Generally, low-fat dairy and lean meats have less cholesterol than full-fat dairy and fatty meats. The milligrams of cholesterol per serving (mg per serving) of common cholesterol-containing foods are listed below. Meats and other proteins Egg -- one large whole egg has 186 mg. Veal shank -- 4 oz (113 g) has 141 mg. Lean ground [place] (93% lean) -- 4 oz (113 g) has 118 mg. Fat-trimmed lamb loin -- 4 oz (113 g) has 106 mg. Lean ground beef (90% lean) -- 4 oz (113 g) has 100 mg. Lobster -- 3.5 oz (99 g) has 90 mg. Pork loin chops -- 4 oz (113 g) has 86 mg. Canned salmon -- 3.5 oz (99 g) has 83 mg. Fat-trimmed beef top loin -- 4 oz (113 g) has 78 mg. Frankfurter -- 1 frank (3.5 oz or 99 g) has 77 mg. Crab -- 3.5 oz (99 g) has 71 mg. Roasted chicken without skin, white meat -- 4 oz (113 g) has 66 mg. Light bologna -- 2 oz (57 g) has 45 mg. Deli-cut [place] -- 2 oz (57 g) has 31 mg. Canned tuna -- 3.5 oz (99 g) has 31 mg. Brianna Carroll -- 1 oz (28 g) has 29 mg. Oysters and mussels (raw) -- 3.5 oz (99 g) has 25 mg. Mackerel -- 1 oz (28 g) has 22 mg. Trout -- 1 oz (28 g) has 20 mg. Pork sausage -- 1 link (1 oz or 28 g) has 17 mg. Salmon -- 1 oz (28 g) has 16 mg. Tilapia -- 1 oz (28 g) has 14 mg. Dairy Soft-serve ice cream --  cup (4 oz or 86 g) has 103 mg. Whole-milk yogurt -- 1 cup (8 oz or 245 g) has 29 mg. Cheddar cheese -- 1 oz (28 g) has 28 mg. American cheese -- 1 oz (28 g) has 28 mg. Whole milk -- 1 cup (8 oz or 250 mL) has 23 mg. 2% milk -- 1 cup (8 oz or 250 mL) has 18 mg. Cream cheese -- 1 tablespoon (Tbsp) (14.5 g) has 15 mg. Cottage cheese --  cup (4 oz or  113 g) has 14 mg. Low-fat (1%) milk -- 1 cup (8 oz or 250 mL) has 10 mg. Sour cream -- 1 Tbsp (12 g) has 8.5 mg. Low-fat yogurt -- 1 cup (8 oz or 245 g) has 8 mg. Nonfat Greek yogurt -- 1 cup (8 oz or 228 g) has 7 mg. Half-and-half cream -- 1 Tbsp (15 mL) has 5 mg. Fats and oils Cod liver oil -- 1 tablespoon (Tbsp) (13.6 g) has 82 mg. Butter -- 1 Tbsp (14 g) has 15 mg. Lard -- 1 Tbsp (12.8 g) has 14 mg. Bacon grease -- 1 Tbsp (12.9 g) has 14 mg. Mayonnaise -- 1 Tbsp (13.8 g) has 5-10 mg. Margarine -- 1 Tbsp (14 g) has 3-10 mg. The items listed above may not be a complete list of foods with cholesterol. Exact amounts of cholesterol in these foods may vary depending on specific ingredients and brands. Contact a dietitian for more information. What foods do not have cholesterol? Most plant-based foods do not have cholesterol unless you combine them with a food that has  cholesterol. Foods without cholesterol include: Grains and cereals. Vegetables. Fruits. Vegetable oils, such as olive, canola, and sunflower oil. Legumes, such as peas, beans, and lentils. Nuts and seeds. Egg whites. The items listed above may not be a complete list of foods that do not have cholesterol. Contact a dietitian for more information. Summary The body needs cholesterol in small amounts, but too much cholesterol can cause damage to the arteries and heart. Cholesterol is found in animal-based foods, such as meat, seafood, and dairy. Generally, low-fat dairy and lean meats have less cholesterol than full-fat dairy and fatty meats. This information is not intended to replace advice given to you by your health care provider. Make sure you discuss any questions you have with your health care provider. Document Revised: 10/13/2020 Document Reviewed: 10/13/2020 Elsevier Patient Education  2024 [employer].

## 2023-07-15 NOTE — Progress Notes (Signed)
Established Patient Office Visit  Subjective   Patient ID: Brianna Carroll, female    DOB: 09-15-1960  Age: 63 y.o. MRN: [id number]  Chief Complaint  Patient presents with   Hyperlipidemia   Follow-up    HPI Discussed the use of AI scribe software for clinical note transcription with the patient, who gave verbal consent to proceed.  History of Present Illness   The patient presents for a follow-up on cholesterol and concerns about thyroid function.  She is here for a follow-up on her cholesterol levels. There are no specific details on her current levels or any treatment adjustments discussed during this visit.  She has concerns about her thyroid function, particularly due to thin hair. During a recent visit for a hand procedure, a comment was made about her thin hair, prompting her to inquire about her thyroid. She recalls that her thyroid levels have been checked during past physicals and were normal, but it has been a while since the last check.  She underwent foot surgery on November 12th, 2024, for bone issues on her right foot. Today is the first day she has been able to wear a tennis shoe, and she was released to do so yesterday. She feels she is doing well post-surgery.  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 She has undergone surgery for the right foot, which was more painful, and is hoping to avoid surgery on the left foot as it is not currently bothersome.  She describes a fall from a ladder during the summer of 2024, resulting in a significant cut and scarring on her leg. Her husband suggested going to the emergency room, but she opted not to. She is concerned about the appearance of the scar and inquires if it will improve over time.  She mentions her previous exercise routine, which included water aerobics and volleyball, but has not resumed these activities since her surgery. She plans to start  water aerobics again next week.      Patient Active Problem List   Diagnosis Date Noted   Left lower quadrant abdominal pain 12/19/2020   Hypertriglyceridemia 02/07/2020   Tinnitus of both ears 11/09/2019   Breast hypertrophy in female 12/10/2018   Allergic conjunctivitis 08/17/2018   Mild intermittent asthma 08/17/2018   Insomnia 07/29/2017   Mid back pain 08/02/2014   Allergic rhinitis 08/02/2014   HEARING LOSS 06/25/2007   OSA on CPAP 06/25/2007   Hyperlipidemia 06/24/2007   Generalized anxiety disorder 06/24/2007   Mitral valve disease 06/24/2007   LOW BACK PAIN SYNDROME 06/24/2007   Past Medical History:  Diagnosis Date   Allergic rhinitis    Anemia, iron deficiency    Anxiety    Asthma    Hearing loss    Heart murmur    Hyperlipidemia    Low back pain syndrome    Mitral valve prolapse    Sinus headache    Sleep apnea    wears CPAP   Snoring    Past Surgical History:  Procedure Laterality Date   BREAST REDUCTION SURGERY Bilateral 12/10/2018   Procedure: BILATERAL MAMMARY REDUCTION  (BREAST);  Surgeon: Etter Sjogren, MD;  Location: [facility] OR;  Service: Plastics;  Laterality: Bilateral;   CESAREAN SECTION  11/90, 12/95   LUMBAR DISC SURGERY  2004   REDUCTION MAMMAPLASTY Bilateral 12/10/2018   TUBAL LIGATION  05/30/94   Social History   Tobacco Use   Smoking  status: Never   Smokeless tobacco: Never  Vaping Use   Vaping status: Never Used  Substance Use Topics   Alcohol use: Yes    Alcohol/week: 1.0 standard drink of alcohol    Types: 1 Standard drinks or equivalent per week   Drug use: No   Social History   Socioeconomic History   Marital status: Married    Spouse name: Barbara Cower   Number of children: 2   Years of education: Not on file   Highest education level: Associate degree: academic program  Occupational History   Occupation: Secretary/administrator: [employer]    Comment: retired   Tobacco Use   Smoking status: Never   Smokeless  tobacco: Never  Vaping Use   Vaping status: Never Used  Substance and Sexual Activity   Alcohol use: Yes    Alcohol/week: 1.0 standard drink of alcohol    Types: 1 Standard drinks or equivalent per week   Drug use: No   Sexual activity: Yes    Partners: Male    Birth control/protection: Surgical    Comment: BTL  Other Topics Concern   Not on file  Social History Narrative   Not on file   Social Drivers of Health   Financial Resource Strain: Low Risk  (07/14/2023)   Overall Financial Resource Strain (CARDIA)    Difficulty of Paying Living Expenses: Not hard at all  Food Insecurity: No Food Insecurity (07/14/2023)   Hunger Vital Sign    Worried About Running Out of Food in the Last Year: Never true    Ran Out of Food in the Last Year: Never true  Transportation Needs: No Transportation Needs (07/14/2023)   PRAPARE - Administrator, Civil Service (Medical): No    Lack of Transportation (Non-Medical): No  Physical Activity: Insufficiently Active (07/14/2023)   Exercise Vital Sign    Days of Exercise per Week: 3 days    Minutes of Exercise per Session: 30 min  Stress: No Stress Concern Present (07/14/2023)   [employer] of Occupational Health - Occupational Stress Questionnaire    Feeling of Stress : Only a little  Social Connections: Socially Integrated (07/14/2023)   Social Connection and Isolation Panel [NHANES]    Frequency of Communication with Friends and Family: Twice a week    Frequency of Social Gatherings with Friends and Family: Once a week    Attends Religious Services: More than 4 times per year    Active Member of [employer] or Organizations: No    Attends Engineer, structural: 1 to 4 times per year    Marital Status: Married  Catering manager Violence: Not on file   Family Status  Relation Name Status   Mother  Deceased at age 61       lung cancer   Father  Deceased at age 28       lung cancer   Sister pam Armed forces logistics/support/administrative officer (Not Specified)   Sister   (Not Specified)   Brother  (Not Specified)   PGM  (Not Specified)   Son Ladona Ridgel Alive   Son  Alive  No partnership data on file   Family History  Problem Relation Age of Onset   Cancer Mother        Lung Cancer   Cancer Father        Lung    COPD Sister    Cancer Brother        throat cancer-heavy smoker   Memory  loss Paternal Grandmother    Asthma Son    Allergic rhinitis Son    Allergic rhinitis Son    Allergies  Allergen Reactions   Doxycycline Hives and Rash   Codeine Nausea And Vomiting   Hydrocodone Bit-Homatrop Mbr Nausea And Vomiting   Hydrocodone-Acetaminophen Nausea And Vomiting      Review of Systems  Constitutional:  Negative for chills, fever and malaise/fatigue.  HENT:  Negative for congestion and hearing loss.   Eyes:  Negative for discharge.  Respiratory:  Negative for cough, sputum production and shortness of breath.   Cardiovascular:  Negative for chest pain, palpitations and leg swelling.  Gastrointestinal:  Negative for abdominal pain, blood in stool, constipation, diarrhea, heartburn, nausea and vomiting.  Genitourinary:  Negative for dysuria, frequency, hematuria and urgency.  Musculoskeletal:  Negative for back pain, falls and myalgias.  Skin:  Negative for rash.  Neurological:  Negative for dizziness, sensory change, loss of consciousness, weakness and headaches.  Endo/Heme/Allergies:  Negative for environmental allergies. Does not bruise/bleed easily.  Psychiatric/Behavioral:  Negative for depression and suicidal ideas. The patient is not nervous/anxious and does not have insomnia.       Objective:     BP 114/82 (BP Location: Left Arm, Patient Position: Sitting, Cuff Size: Normal)   Pulse 65   Temp 97.8 F (36.6 C) (Oral)   Resp 18   Ht 5\' 1"  (1.549 m)   Wt 160 lb 3.2 oz (72.7 kg)   LMP 09/29/2013 (LMP Unknown)   SpO2 97%   BMI 30.27 kg/m  BP Readings from Last 3 Encounters:  07/15/23 114/82  03/28/23 110/78  09/23/22 118/76    Wt Readings from Last 3 Encounters:  07/15/23 160 lb 3.2 oz (72.7 kg)  03/28/23 161 lb (73 kg)  09/23/22 154 lb (69.9 kg)   SpO2 Readings from Last 3 Encounters:  07/15/23 97%  03/28/23 97%  09/17/22 96%      Physical Exam Vitals and nursing note reviewed.  Constitutional:      General: She is not in acute distress.    Appearance: Normal appearance. She is well-developed.  HENT:     Head: Normocephalic and atraumatic.  Eyes:     General: No scleral icterus.       Right eye: No discharge.        Left eye: No discharge.  Cardiovascular:     Rate and Rhythm: Normal rate and regular rhythm.     Heart sounds: No murmur heard. Pulmonary:     Effort: Pulmonary effort is normal. No respiratory distress.     Breath sounds: Normal breath sounds.  Musculoskeletal:        General: Normal range of motion.     Cervical back: Normal range of motion and neck supple.     Right lower leg: No edema.     Left lower leg: No edema.  Skin:    General: Skin is warm and dry.  Neurological:     Mental Status: She is alert and oriented to person, place, and time.  Psychiatric:        Mood and Affect: Mood normal.        Behavior: Behavior normal.        Thought Content: Thought content normal.        Judgment: Judgment normal.      No results found for any visits on 07/15/23.  Last CBC Lab Results  Component Value Date   WBC 5.4 05/20/2022   HGB 13.9  05/20/2022   HCT 40.9 05/20/2022   MCV 93.1 05/20/2022   MCH 30.9 12/07/2018   RDW 13.2 05/20/2022   PLT 312.0 05/20/2022   Last metabolic panel Lab Results  Component Value Date   GLUCOSE 86 03/28/2023   NA 141 03/28/2023   K 4.4 03/28/2023   CL 104 03/28/2023   CO2 29 03/28/2023   BUN 10 03/28/2023   CREATININE 0.85 03/28/2023   GFR 73.21 03/28/2023   CALCIUM 9.8 03/28/2023   PROT 6.2 03/28/2023   ALBUMIN 4.4 03/28/2023   BILITOT 0.3 03/28/2023   ALKPHOS 28 (L) 03/28/2023   AST 24 03/28/2023   ALT 22 03/28/2023    ANIONGAP 9 12/07/2018   Last lipids Lab Results  Component Value Date   CHOL 248 (H) 03/28/2023   HDL 52.10 03/28/2023   LDLCALC 158 (H) 03/28/2023   LDLDIRECT 245.0 05/20/2022   TRIG 185.0 (H) 03/28/2023   CHOLHDL 5 03/28/2023   Last hemoglobin A1c Lab Results  Component Value Date   HGBA1C 5.8 (H) 09/23/2022   Last thyroid functions Lab Results  Component Value Date   TSH 2.950 09/23/2022   T4TOTAL 7.5 09/18/2015   Last vitamin D Lab Results  Component Value Date   VD25OH 50 11/25/2013   Last vitamin B12 and Folate Lab Results  Component Value Date   VITAMINB12 791 09/23/2022   FOLATE >20.0 09/23/2022      The 10-year ASCVD risk score (Arnett DK, et al., 2019) is: 4.3%    Assessment & Plan:   Problem List Items Addressed This Visit       Unprioritized   Mild intermittent asthma   Relevant Orders   CBC with Differential/Platelet   Comprehensive metabolic panel   Lipid panel   Thyroid Panel With TSH   Hyperlipidemia - Primary   .Tolerating statin, encouraged heart healthy diet, avoid trans fats, minimize simple carbs and saturated fats. Increase exercise as tolerated       Relevant Orders   CBC with Differential/Platelet   Comprehensive metabolic panel   Lipid panel   Thyroid Panel With TSH   Other Visit Diagnoses       Need for pneumococcal 20-valent conjugate vaccination       Relevant Orders   Pneumococcal conjugate vaccine 20-valent (Prevnar 20) (Completed)     Assessment and Plan    Thyroid Function Evaluation Reports concerns about thin hair and potential thyroid issues. Although the last thyroid function test was normal, it has been a while since it was checked. Thyroid issues can cause hair thinning and neck thickening if severe. Order a comprehensive thyroid panel.  Hyperlipidemia Follow-up on hyperlipidemia with no specific concerns or changes in symptoms. Continue current management and reassess the lipid panel at the next  visit.  Post-Surgical Foot Care Recovery from November foot surgery for bunions is progressing well, and tennis shoes can now be worn. Emphasize the importance of wearing appropriate footwear to prevent further issues. Encourage starting low-impact exercises such as water aerobics.  Scar Management A scar from a summer fall off a ladder may take up to a year to reach its final appearance. Recommend using vitamin E oil for scar management. Consider referral to a dermatologist or plastic surgeon if further treatment is desired.  Pneumococcal Vaccination Eligible for the pneumonia shot and agrees to receive it today. Administer the pneumococcal vaccine as it is a one-time shot.  General Health Maintenance Encourage regular exercise and recommend purchasing high-quality walking shoes such as Brooks.  Follow-up Schedule a follow-up visit in six months for physical and lab work.        Return in about 6 months (around 01/12/2024), or if symptoms worsen or fail to improve.    Donato Schultz, DO

## 2023-07-16 LAB — THYROID PANEL WITH TSH
Free Thyroxine Index: 2.2 (ref 1.4–3.8)
T3 Uptake: 31 % (ref 22–35)
T4, Total: 7.2 ug/dL (ref 5.1–11.9)
TSH: 3.01 m[IU]/L (ref 0.40–4.50)

## 2023-07-18 ENCOUNTER — Encounter: Payer: Self-pay | Admitting: Family Medicine

## 2023-09-06 ENCOUNTER — Other Ambulatory Visit: Payer: Self-pay | Admitting: Family Medicine

## 2023-09-06 ENCOUNTER — Other Ambulatory Visit (HOSPITAL_BASED_OUTPATIENT_CLINIC_OR_DEPARTMENT_OTHER): Payer: Self-pay | Admitting: Obstetrics & Gynecology

## 2023-09-06 DIAGNOSIS — G8929 Other chronic pain: Secondary | ICD-10-CM

## 2023-09-06 DIAGNOSIS — G4700 Insomnia, unspecified: Secondary | ICD-10-CM

## 2023-09-07 ENCOUNTER — Other Ambulatory Visit (HOSPITAL_BASED_OUTPATIENT_CLINIC_OR_DEPARTMENT_OTHER): Payer: Self-pay | Admitting: Obstetrics & Gynecology

## 2023-09-07 DIAGNOSIS — F411 Generalized anxiety disorder: Secondary | ICD-10-CM

## 2023-09-11 ENCOUNTER — Ambulatory Visit (HOSPITAL_BASED_OUTPATIENT_CLINIC_OR_DEPARTMENT_OTHER): Admitting: Certified Nurse Midwife

## 2023-09-12 ENCOUNTER — Encounter (HOSPITAL_BASED_OUTPATIENT_CLINIC_OR_DEPARTMENT_OTHER): Payer: Self-pay | Admitting: Certified Nurse Midwife

## 2023-09-12 ENCOUNTER — Ambulatory Visit (HOSPITAL_BASED_OUTPATIENT_CLINIC_OR_DEPARTMENT_OTHER): Admitting: Certified Nurse Midwife

## 2023-09-12 ENCOUNTER — Other Ambulatory Visit (HOSPITAL_COMMUNITY)
Admission: RE | Admit: 2023-09-12 | Discharge: 2023-09-12 | Disposition: A | Discharge status: Home / Self Care | Source: Ambulatory Visit | Attending: Certified Nurse Midwife | Admitting: Certified Nurse Midwife

## 2023-09-12 VITALS — BP 121/72 | HR 63 | Ht 61.0 in | Wt 151.4 lb

## 2023-09-12 DIAGNOSIS — F411 Generalized anxiety disorder: Secondary | ICD-10-CM

## 2023-09-12 DIAGNOSIS — G4700 Insomnia, unspecified: Secondary | ICD-10-CM

## 2023-09-12 DIAGNOSIS — Z01419 Encounter for gynecological examination (general) (routine) without abnormal findings: Secondary | ICD-10-CM | POA: Diagnosis present

## 2023-09-12 MED ORDER — PAROXETINE HCL 10 MG PO TABS: 10.0000 mg | ORAL_TABLET | Freq: Every day | ORAL | 3 refills | Status: AC

## 2023-09-12 MED ORDER — TRAZODONE HCL 50 MG PO TABS: 50.0000 mg | ORAL_TABLET | Freq: Every evening | ORAL | 2 refills | Status: DC | PRN

## 2023-09-12 NOTE — Progress Notes (Signed)
 63 y.o. G2P2 Married White or Caucasian female here for annual exam.  She would like a refill of Paxila nd Trazodone.   Patient's last menstrual period was 09/29/2013 (lmp unknown).           Exercising: Yes.     Smoker:  no  Health Maintenance: Pap:  01/2021 Negative History of abnormal Pap:  no MMG:  07/15/23 Colonoscopy:  06/07/2016, follow-up in 10 years BMD:   06/15/20 Screening Labs: UTD per pt   reports that she has never smoked. She has never used smokeless tobacco. She reports that she does not currently use alcohol. She reports that she does not use drugs.  Past Medical History:  Diagnosis Date   Allergic rhinitis    Anemia, iron deficiency    Anxiety    Asthma    Hearing loss    Heart murmur    Hyperlipidemia    Low back pain syndrome    Mitral valve prolapse    Sinus headache    Sleep apnea    wears CPAP   Snoring     Past Surgical History:  Procedure Laterality Date   BREAST REDUCTION SURGERY Bilateral 12/10/2018   Procedure: BILATERAL MAMMARY REDUCTION  (BREAST);  Surgeon: Etter Sjogren, MD;  Location: [facility] OR;  Service: Plastics;  Laterality: Bilateral;   CESAREAN SECTION  11/90, 12/95   HAMMER TOE SURGERY Right 04/29/2023   LUMBAR DISC SURGERY  2004   REDUCTION MAMMAPLASTY Bilateral 12/10/2018   TUBAL LIGATION  05/30/1994    Current Outpatient Medications  Medication Sig Dispense Refill   albuterol (VENTOLIN HFA) 108 (90 Base) MCG/ACT inhaler Inhale 2 puffs into the lungs every 6 (six) hours as needed for wheezing or shortness of breath. 8 g 5   azelastine (ASTELIN) 0.1 % nasal spray 1-2 sprays each nostril up to 2 times daily as needed for drainage. 30 mL 12   Cholecalciferol (VITAMIN D) 50 MCG (2000 UT) tablet Take 2,000 Units by mouth at bedtime.     diphenhydrAMINE (BENADRYL) 25 MG tablet Take 25 mg by mouth at bedtime as needed for sleep or allergies.     fenofibrate (TRICOR) 145 MG tablet Take 1 tablet by mouth once daily 90 tablet 1    fluticasone (FLONASE) 50 MCG/ACT nasal spray Place 2 sprays into both nostrils daily. 16 g 5   loratadine (CLARITIN) 10 MG tablet Take 1 tablet (10 mg total) by mouth daily. 30 tablet 0   naproxen (NAPROSYN) 500 MG tablet TAKE 1 TABLET BY MOUTH TWICE DAILY AS NEEDED WITH FOOD FOR MODERATE PAIN 180 tablet 0   rosuvastatin (CRESTOR) 20 MG tablet Take 1 tablet (20 mg total) by mouth at bedtime. 90 tablet 3   PARoxetine (PAXIL) 10 MG tablet Take 1 tablet (10 mg total) by mouth daily. TAKE 1 & 1/2 (ONE & ONE-HALF) TABLETS BY MOUTH ONCE DAILY 135 tablet 3   traZODone (DESYREL) 50 MG tablet Take 1 tablet (50 mg total) by mouth at bedtime as needed. for sleep 90 tablet 2   No current facility-administered medications for this visit.    Family History  Problem Relation Age of Onset   Cancer Mother        Lung Cancer   Cancer Father        Lung    COPD Sister    Cancer Brother        throat cancer-heavy smoker   Memory loss Paternal Grandmother    Asthma Son    Allergic  rhinitis Son    Allergic rhinitis Son     ROS: Constitutional: negative Genitourinary:negative  Exam:   BP 121/72 (Cuff Size: Normal)   Pulse 63   Ht 5\' 1"  (1.549 m)   Wt 151 lb 6.4 oz (68.7 kg)   LMP 09/29/2013 (LMP Unknown)   BMI 28.61 kg/m   Height: 5\' 1"  (154.9 cm)  General appearance: alert, cooperative and appears stated age Head: Normocephalic, without obvious abnormality, atraumatic Neck: no adenopathy, supple, symmetrical, trachea midline and thyroid normal to inspection and palpation Lungs: clear to auscultation bilaterally Breasts: normal appearance, no masses or tenderness, Inspection negative, No nipple retraction or dimpling, No nipple discharge or bleeding, No axillary or supraclavicular adenopathy Heart: regular rate and rhythm Abdomen: soft, non-tender; bowel sounds normal; no masses,  no organomegaly Extremities: extremities normal, atraumatic, no cyanosis or edema Skin: Skin color, texture,  turgor normal. No rashes or lesions Lymph nodes: Cervical, supraclavicular, and axillary nodes normal. No abnormal inguinal nodes palpated Neurologic: Grossly normal   Pelvic: External genitalia:  no lesions              Urethra:  normal appearing urethra with no masses, tenderness or lesions              Bartholins and Skenes: normal                 Vagina: normal appearing vagina with normal color and no discharge, no lesions              Cervix: no bleeding following Pap              Pap taken: Yes.   Bimanual Exam:  Uterus:  normal size, contour, position, consistency, mobility, non-tender              Adnexa: no mass, fullness, tenderness               Rectovaginal: Confirms               Anus:  normal sphincter tone, no lesions  Chaperone, CMA, was present for exam.  Assessment/Plan:  1. Encounter for gynecological examination with Papanicolaou smear of cervix (Primary) - Cytology - PAP( [facility])  2. Generalized anxiety disorder - PARoxetine (PAXIL) 10 MG tablet; Take 1 tablet (10 mg total) by mouth daily. TAKE 1 & 1/2 (ONE & ONE-HALF) TABLETS BY MOUTH ONCE DAILY  Dispense: 135 tablet; Refill: 3  3. Insomnia, unspecified type - traZODone (DESYREL) 50 MG tablet; Take 1 tablet (50 mg total) by mouth at bedtime as needed. for sleep  Dispense: 90 tablet; Refill: 2   RTO 1 year for annual gyn exam and prn if issues arise. Brianna Carroll

## 2023-09-18 LAB — CYTOLOGY - PAP
Comment: NEGATIVE
Diagnosis: NEGATIVE
High risk HPV: NEGATIVE

## 2023-09-19 ENCOUNTER — Encounter (HOSPITAL_BASED_OUTPATIENT_CLINIC_OR_DEPARTMENT_OTHER): Payer: Self-pay | Admitting: Certified Nurse Midwife

## 2023-12-06 ENCOUNTER — Other Ambulatory Visit: Payer: Self-pay | Admitting: Family Medicine

## 2023-12-06 DIAGNOSIS — E785 Hyperlipidemia, unspecified: Secondary | ICD-10-CM

## 2024-01-06 ENCOUNTER — Ambulatory Visit (INDEPENDENT_AMBULATORY_CARE_PROVIDER_SITE_OTHER): Payer: 59 | Admitting: Family Medicine

## 2024-01-06 ENCOUNTER — Encounter: Payer: Self-pay | Admitting: Family Medicine

## 2024-01-06 VITALS — BP 120/80 | HR 67 | Temp 98.2°F | Resp 16 | Ht 61.0 in | Wt 153.0 lb

## 2024-01-06 DIAGNOSIS — E785 Hyperlipidemia, unspecified: Secondary | ICD-10-CM | POA: Diagnosis not present

## 2024-01-06 LAB — COMPREHENSIVE METABOLIC PANEL WITH GFR
ALT: 19 U/L (ref 0–35)
AST: 22 U/L (ref 0–37)
Albumin: 4.8 g/dL (ref 3.5–5.2)
Alkaline Phosphatase: 30 U/L — ABNORMAL LOW (ref 39–117)
BUN: 15 mg/dL (ref 6–23)
CO2: 30 meq/L (ref 19–32)
Calcium: 10 mg/dL (ref 8.4–10.5)
Chloride: 103 meq/L (ref 96–112)
Creatinine, Ser: 0.86 mg/dL (ref 0.40–1.20)
GFR: 71.8 mL/min (ref >60.00)
Glucose, Bld: 97 mg/dL (ref 70–99)
Potassium: 4.2 meq/L (ref 3.5–5.1)
Sodium: 140 meq/L (ref 135–145)
Total Bilirubin: 0.5 mg/dL (ref 0.2–1.2)
Total Protein: 6.9 g/dL (ref 6.0–8.3)

## 2024-01-06 LAB — LIPID PANEL
Cholesterol: 159 mg/dL (ref 0–200)
HDL: 57.6 mg/dL (ref >39.00)
LDL Cholesterol: 79 mg/dL (ref 0–99)
NonHDL: 101.38
Total CHOL/HDL Ratio: 3
Triglycerides: 114 mg/dL (ref 0.0–149.0)
VLDL: 22.8 mg/dL (ref 0.0–40.0)

## 2024-01-06 NOTE — Patient Instructions (Addendum)
 Ask Dr Cleotilde about veozah    Cholesterol Content in Foods Cholesterol is a waxy, fat-like substance that helps to carry fat in the blood. The body needs cholesterol in small amounts, but too much cholesterol can cause damage to the arteries and heart. What foods have cholesterol?  Cholesterol is found in animal-based foods, such as meat, seafood, and dairy. Generally, low-fat dairy and lean meats have less cholesterol than full-fat dairy and fatty meats. The milligrams of cholesterol per serving (mg per serving) of common cholesterol-containing foods are listed below. Meats and other proteins Egg -- one large whole egg has 186 mg. Veal shank -- 4 oz (113 g) has 141 mg. Lean ground [place] (93% lean) -- 4 oz (113 g) has 118 mg. Fat-trimmed lamb loin -- 4 oz (113 g) has 106 mg. Lean ground beef (90% lean) -- 4 oz (113 g) has 100 mg. Lobster -- 3.5 oz (99 g) has 90 mg. Pork loin chops -- 4 oz (113 g) has 86 mg. Canned salmon -- 3.5 oz (99 g) has 83 mg. Fat-trimmed beef top loin -- 4 oz (113 g) has 78 mg. Frankfurter -- 1 frank (3.5 oz or 99 g) has 77 mg. Crab -- 3.5 oz (99 g) has 71 mg. Roasted chicken without skin, white meat -- 4 oz (113 g) has 66 mg. Light bologna -- 2 oz (57 g) has 45 mg. Deli-cut turkey -- 2 oz (57 g) has 31 mg. Canned tuna -- 3.5 oz (99 g) has 31 mg. [place] -- 1 oz (28 g) has 29 mg. Oysters and mussels (raw) -- 3.5 oz (99 g) has 25 mg. Mackerel -- 1 oz (28 g) has 22 mg. Trout -- 1 oz (28 g) has 20 mg. Pork sausage -- 1 link (1 oz or 28 g) has 17 mg. Salmon -- 1 oz (28 g) has 16 mg. Tilapia -- 1 oz (28 g) has 14 mg. Dairy Soft-serve ice cream --  cup (4 oz or 86 g) has 103 mg. Whole-milk yogurt -- 1 cup (8 oz or 245 g) has 29 mg. Cheddar cheese -- 1 oz (28 g) has 28 mg. American cheese -- 1 oz (28 g) has 28 mg. Whole milk -- 1 cup (8 oz or 250 mL) has 23 mg. 2% milk -- 1 cup (8 oz or 250 mL) has 18 mg. Cream cheese -- 1 tablespoon (Tbsp) (14.5 g) has 15  mg. Cottage cheese --  cup (4 oz or 113 g) has 14 mg. Low-fat (1%) milk -- 1 cup (8 oz or 250 mL) has 10 mg. Sour cream -- 1 Tbsp (12 g) has 8.5 mg. Low-fat yogurt -- 1 cup (8 oz or 245 g) has 8 mg. Nonfat Greek yogurt -- 1 cup (8 oz or 228 g) has 7 mg. Half-and-half cream -- 1 Tbsp (15 mL) has 5 mg. Fats and oils Cod liver oil -- 1 tablespoon (Tbsp) (13.6 g) has 82 mg. Butter -- 1 Tbsp (14 g) has 15 mg. Lard -- 1 Tbsp (12.8 g) has 14 mg. Bacon grease -- 1 Tbsp (12.9 g) has 14 mg. Mayonnaise -- 1 Tbsp (13.8 g) has 5-10 mg. Margarine -- 1 Tbsp (14 g) has 3-10 mg. The items listed above may not be a complete list of foods with cholesterol. Exact amounts of cholesterol in these foods may vary depending on specific ingredients and brands. Contact a dietitian for more information. What foods do not have cholesterol? Most plant-based foods do not have cholesterol unless  you combine them with a food that has cholesterol. Foods without cholesterol include: Grains and cereals. Vegetables. Fruits. Vegetable oils, such as olive, canola, and sunflower oil. Legumes, such as peas, beans, and lentils. Nuts and seeds. Egg whites. The items listed above may not be a complete list of foods that do not have cholesterol. Contact a dietitian for more information. Summary The body needs cholesterol in small amounts, but too much cholesterol can cause damage to the arteries and heart. Cholesterol is found in animal-based foods, such as meat, seafood, and dairy. Generally, low-fat dairy and lean meats have less cholesterol than full-fat dairy and fatty meats. This information is not intended to replace advice given to you by your health care provider. Make sure you discuss any questions you have with your health care provider. Document Revised: 10/13/2020 Document Reviewed: 10/13/2020 Elsevier Patient Education  2024 [employer].

## 2024-01-06 NOTE — Assessment & Plan Note (Signed)
 Tolerating statin, encouraged heart healthy diet, avoid trans fats, minimize simple carbs and saturated fats. Increase exercise as tolerated

## 2024-01-06 NOTE — Progress Notes (Signed)
 Established Patient Office Visit  Subjective   Patient ID: Brianna Carroll, female    DOB: 1961/02/10  Age: 63 y.o. MRN: [id number]  Chief Complaint  Patient presents with   Hyperlipidemia   Follow-up    HPI Discussed the use of AI scribe software for clinical note transcription with the patient, who gave verbal consent to proceed.  History of Present Illness Brianna Carroll is a 63 year old female who presents with hot flashes and sleep disturbances related to menopause.  She experiences hot flashes and sleep disturbances, waking up around 4:30 AM sweating. Her husband prefers a warmer room temperature, which she finds too hot. She has attempted to manage the situation by using fans and negotiating temperature settings with her husband, who prefers it at 89F.  She is currently taking paroxetine  (Paxil ) and trazodone  for sleep. Occasionally, she takes one and a half doses of Paxil  at night to help manage her symptoms.  Her son, who also has asthma, finds the house too hot when he visits. She has a history of asthma flare-ups, which she believes are exacerbated by the warm indoor temperatures. No other symptoms apart from hot flashes and sleep disturbances.   Patient Active Problem List   Diagnosis Date Noted   Left lower quadrant abdominal pain 12/19/2020   Hypertriglyceridemia 02/07/2020   Tinnitus of both ears 11/09/2019   Breast hypertrophy in female 12/10/2018   Allergic conjunctivitis 08/17/2018   Mild intermittent asthma 08/17/2018   Insomnia 07/29/2017   Mid back pain 08/02/2014   Allergic rhinitis 08/02/2014   HEARING LOSS 06/25/2007   OSA on CPAP 06/25/2007   Hyperlipidemia 06/24/2007   Generalized anxiety disorder 06/24/2007   Mitral valve disease 06/24/2007   LOW BACK PAIN SYNDROME 06/24/2007   Past Medical History:  Diagnosis Date   Allergic rhinitis    Anemia, iron deficiency    Anxiety    Asthma    Hearing loss    Heart murmur    Hyperlipidemia     Low back pain syndrome    Mitral valve prolapse    Sinus headache    Sleep apnea    wears CPAP   Snoring    Past Surgical History:  Procedure Laterality Date   BREAST REDUCTION SURGERY Bilateral 12/10/2018   Procedure: BILATERAL MAMMARY REDUCTION  (BREAST);  Surgeon: Leora Lenis, MD;  Location: [facility];  Service: Plastics;  Laterality: Bilateral;   CESAREAN SECTION  11/90, 12/95   HAMMER TOE SURGERY Right 04/29/2023   LUMBAR DISC SURGERY  2004   REDUCTION MAMMAPLASTY Bilateral 12/10/2018   TUBAL LIGATION  05/30/1994   Social History   Tobacco Use   Smoking status: Never   Smokeless tobacco: Never  Vaping Use   Vaping status: Never Used  Substance Use Topics   Alcohol use: Not Currently    Comment: occ   Drug use: No   Social History   Socioeconomic History   Marital status: Married    Spouse name: Selinda   Number of children: 2   Years of education: Not on file   Highest education level: Associate degree: academic program  Occupational History   Occupation: Secretary/administrator: [employer]    Comment: retired   Tobacco Use   Smoking status: Never   Smokeless tobacco: Never  Vaping Use   Vaping status: Never Used  Substance and Sexual Activity   Alcohol use: Not Currently    Comment: occ   Drug  use: No   Sexual activity: Yes    Partners: Male    Birth control/protection: Surgical, Post-menopausal    Comment: BTL  Other Topics Concern   Not on file  Social History Narrative   Not on file   Social Drivers of Health   Financial Resource Strain: Low Risk  (01/05/2024)   Overall Financial Resource Strain (CARDIA)    Difficulty of Paying Living Expenses: Not hard at all  Food Insecurity: No Food Insecurity (01/05/2024)   Hunger Vital Sign    Worried About Running Out of Food in the Last Year: Never true    Ran Out of Food in the Last Year: Never true  Transportation Needs: Patient Declined (01/05/2024)   PRAPARE - Transportation    Lack  of Transportation (Medical): Patient declined    Lack of Transportation (Non-Medical): Patient declined  Physical Activity: Insufficiently Active (01/05/2024)   Exercise Vital Sign    Days of Exercise per Week: 2 days    Minutes of Exercise per Session: 30 min  Stress: No Stress Concern Present (01/05/2024)   [employer] of Occupational Health - Occupational Stress Questionnaire    Feeling of Stress: Not at all  Social Connections: Unknown (01/05/2024)   Social Connection and Isolation Panel    Frequency of Communication with Friends and Family: Patient declined    Frequency of Social Gatherings with Friends and Family: Patient declined    Attends Religious Services: Patient declined    Database administrator or Organizations: Patient declined    Attends Engineer, structural: Not on file    Marital Status: Married  Catering manager Violence: Not on file   Family Status  Relation Name Status   Mother  Deceased at age 78       lung cancer   Father  Deceased at age 35       lung cancer   Sister pam Armed forces logistics/support/administrative officer (Not Specified)   Sister  (Not Specified)   Brother  (Not Specified)   PGM  (Not Specified)   Son Waddell Alive   Son  Alive  No partnership data on file   Family History  Problem Relation Age of Onset   Cancer Mother        Lung Cancer   Cancer Father        Lung    COPD Sister    Cancer Brother        throat cancer-heavy smoker   Memory loss Paternal Grandmother    Asthma Son    Allergic rhinitis Son    Allergic rhinitis Son    Allergies  Allergen Reactions   Doxycycline Hives and Rash   Codeine Nausea And Vomiting   Hydrocodone Bit-Homatrop Mbr Nausea And Vomiting   Hydrocodone-Acetaminophen  Nausea And Vomiting      Review of Systems  Constitutional:  Negative for fever and malaise/fatigue.  HENT:  Negative for congestion.   Eyes:  Negative for blurred vision.  Respiratory:  Negative for shortness of breath.   Cardiovascular:  Negative for  chest pain, palpitations and leg swelling.  Gastrointestinal:  Negative for abdominal pain, blood in stool and nausea.  Genitourinary:  Negative for dysuria and frequency.  Musculoskeletal:  Negative for falls.  Skin:  Negative for rash.  Neurological:  Negative for dizziness, loss of consciousness and headaches.  Endo/Heme/Allergies:  Negative for environmental allergies.  Psychiatric/Behavioral:  Negative for depression. The patient is not nervous/anxious.       Objective:     BP  120/80 (BP Location: Left Arm, Patient Position: Sitting, Cuff Size: Normal)   Pulse 67   Temp 98.2 F (36.8 C) (Oral)   Resp 16   Ht 5' 1 (1.549 m)   Wt 153 lb (69.4 kg)   LMP 09/29/2013 (LMP Unknown)   SpO2 97%   BMI 28.91 kg/m  BP Readings from Last 3 Encounters:  01/06/24 120/80  09/12/23 121/72  07/15/23 114/82   Wt Readings from Last 3 Encounters:  01/06/24 153 lb (69.4 kg)  09/12/23 151 lb 6.4 oz (68.7 kg)  07/15/23 160 lb 3.2 oz (72.7 kg)   SpO2 Readings from Last 3 Encounters:  01/06/24 97%  07/15/23 97%  03/28/23 97%      Physical Exam Vitals and nursing note reviewed.  Constitutional:      General: She is not in acute distress.    Appearance: Normal appearance. She is well-developed.  HENT:     Head: Normocephalic and atraumatic.  Eyes:     General: No scleral icterus.       Right eye: No discharge.        Left eye: No discharge.  Cardiovascular:     Rate and Rhythm: Normal rate and regular rhythm.     Heart sounds: No murmur heard. Pulmonary:     Effort: Pulmonary effort is normal. No respiratory distress.     Breath sounds: Normal breath sounds.  Musculoskeletal:        General: Normal range of motion.     Cervical back: Normal range of motion and neck supple.     Right lower leg: No edema.     Left lower leg: No edema.  Skin:    General: Skin is warm and dry.  Neurological:     Mental Status: She is alert and oriented to person, place, and time.   Psychiatric:        Mood and Affect: Mood normal.        Behavior: Behavior normal.        Thought Content: Thought content normal.        Judgment: Judgment normal.      No results found for any visits on 01/06/24.  Last CBC Lab Results  Component Value Date   WBC 4.2 07/15/2023   HGB 13.6 07/15/2023   HCT 40.7 07/15/2023   MCV 93.6 07/15/2023   MCH 30.9 12/07/2018   RDW 13.4 07/15/2023   PLT 330.0 07/15/2023   Last metabolic panel Lab Results  Component Value Date   GLUCOSE 85 07/15/2023   NA 140 07/15/2023   K 4.4 07/15/2023   CL 102 07/15/2023   CO2 30 07/15/2023   BUN 20 07/15/2023   CREATININE 0.78 07/15/2023   GFR 80.99 07/15/2023   CALCIUM  9.8 07/15/2023   PROT 6.8 07/15/2023   ALBUMIN 4.8 07/15/2023   BILITOT 0.4 07/15/2023   ALKPHOS 33 (L) 07/15/2023   AST 21 07/15/2023   ALT 18 07/15/2023   ANIONGAP 9 12/07/2018   Last lipids Lab Results  Component Value Date   CHOL 122 07/15/2023   HDL 47.00 07/15/2023   LDLCALC 56 07/15/2023   LDLDIRECT 245.0 05/20/2022   TRIG 95.0 07/15/2023   CHOLHDL 3 07/15/2023   Last hemoglobin A1c Lab Results  Component Value Date   HGBA1C 5.8 (H) 09/23/2022   Last thyroid  functions Lab Results  Component Value Date   TSH 3.01 07/15/2023   T4TOTAL 7.2 07/15/2023   Last vitamin D  Lab Results  Component Value Date  VD25OH 50 11/25/2013   Last vitamin B12 and Folate Lab Results  Component Value Date   VITAMINB12 791 09/23/2022   FOLATE >20.0 09/23/2022      The ASCVD Risk score (Arnett DK, et al., 2019) failed to calculate for the following reasons:   The valid total cholesterol range is 130 to 320 mg/dL    Assessment & Plan:   Problem List Items Addressed This Visit       Unprioritized   Hyperlipidemia - Primary   Tolerating statin, encouraged heart healthy diet, avoid trans fats, minimize simple carbs and saturated fats. Increase exercise as tolerated       Relevant Orders   Comprehensive  metabolic panel with GFR   Lipid panel  Tolerating statin, encouraged heart healthy diet, avoid trans fats, minimize simple carbs and saturated fats. Increase exercise as tolerated  Assessment and Plan Assessment & Plan Menopausal symptoms   She experiences worsening menopausal symptoms, mainly hot flashes, worsened by the warm home environment preferred by her husband. Current medications, paroxetine  and trazodone , offer partial relief. A cooler sleeping environment is advised, with a cooling mattress pad as a potential solution despite its high cost. Veozah, a new non-hormonal medication, is a possible treatment option. Consult a gynecologist about starting Veozah. Consider purchasing a cooling mattress pad and maintain a cooler home environment, especially at night, to manage hot flashes.  Asthma   Asthma symptoms worsen with high indoor temperatures. A cooler home environment is crucial to prevent asthma flare-ups, as her son also experiences symptoms in the warm home. Maintain a cooler home environment to prevent asthma exacerbations.   Return in about 6 months (around 07/08/2024) for annual exam, fasting.    Nakaila Freeze R Lowne Chase, DO

## 2024-01-11 ENCOUNTER — Ambulatory Visit: Payer: Self-pay | Admitting: Family Medicine

## 2024-03-09 ENCOUNTER — Other Ambulatory Visit: Payer: Self-pay | Admitting: Family Medicine

## 2024-03-09 DIAGNOSIS — E785 Hyperlipidemia, unspecified: Secondary | ICD-10-CM

## 2024-03-27 ENCOUNTER — Other Ambulatory Visit: Payer: Self-pay | Admitting: Family Medicine

## 2024-03-27 DIAGNOSIS — E785 Hyperlipidemia, unspecified: Secondary | ICD-10-CM

## 2024-05-31 ENCOUNTER — Other Ambulatory Visit: Payer: Self-pay | Admitting: Family Medicine

## 2024-05-31 DIAGNOSIS — G4700 Insomnia, unspecified: Secondary | ICD-10-CM

## 2024-05-31 NOTE — Telephone Encounter (Unsigned)
 Copied from CRM #[id number]. Topic: Clinical - Medication Refill >> May 31, 2024  1:14 PM Berneda F wrote: Medication:  traZODone  (DESYREL ) 50 MG tablet   Has the patient contacted their pharmacy? Yes (Agent: If no, request that the patient contact the pharmacy for the refill. If patient does not wish to contact the pharmacy document the reason why and proceed with request.) (Agent: If yes, when and what did the pharmacy advise?)  This is the patient's preferred pharmacy:  [facility] [address], [place] - [address] [facility] [place] [address] Phone: [phone] Fax: [phone]  Is this the correct pharmacy for this prescription? Yes If no, delete pharmacy and type the correct one.   Has the prescription been filled recently? No  Is the patient out of the medication? No  Has the patient been seen for an appointment in the last year OR does the patient have an upcoming appointment? Yes  Can we respond through MyChart? Yes  Agent: Please be advised that Rx refills may take up to 3 business days. We ask that you follow-up with your pharmacy.

## 2024-06-01 MED ORDER — TRAZODONE HCL 50 MG PO TABS: 50.0000 mg | ORAL_TABLET | Freq: Every evening | ORAL | 0 refills | Status: AC | PRN

## 2024-06-16 ENCOUNTER — Encounter: Payer: Self-pay | Admitting: Family Medicine

## 2024-06-16 ENCOUNTER — Other Ambulatory Visit (HOSPITAL_BASED_OUTPATIENT_CLINIC_OR_DEPARTMENT_OTHER): Payer: Self-pay

## 2024-06-16 ENCOUNTER — Ambulatory Visit: Admitting: Family Medicine

## 2024-06-16 VITALS — BP 136/62 | HR 78 | Temp 98.0°F | Ht 61.0 in | Wt 165.0 lb

## 2024-06-16 DIAGNOSIS — R350 Frequency of micturition: Secondary | ICD-10-CM | POA: Diagnosis not present

## 2024-06-16 DIAGNOSIS — R1032 Left lower quadrant pain: Secondary | ICD-10-CM | POA: Diagnosis not present

## 2024-06-16 LAB — POC URINALSYSI DIPSTICK (AUTOMATED)
Bilirubin, UA: NEGATIVE
Blood, UA: NEGATIVE
Glucose, UA: NEGATIVE
Ketones, UA: NEGATIVE
Nitrite, UA: NEGATIVE
Protein, UA: NEGATIVE
Spec Grav, UA: <=1.005 — AB
Urobilinogen, UA: 0.2 U/dL
pH, UA: 7.5

## 2024-06-16 LAB — COMPREHENSIVE METABOLIC PANEL WITH GFR
AG Ratio: 2 (calc) (ref 1.0–2.5)
ALT: 14 U/L (ref 6–29)
AST: 16 U/L (ref 10–35)
Albumin: 4.3 g/dL (ref 3.6–5.1)
Alkaline phosphatase (APISO): 40 U/L (ref 37–153)
BUN: 16 mg/dL (ref 7–25)
CO2: 33 mmol/L — ABNORMAL HIGH (ref 20–32)
Calcium: 9.3 mg/dL (ref 8.6–10.4)
Chloride: 104 mmol/L (ref 98–110)
Creat: 0.89 mg/dL (ref 0.50–1.05)
Globulin: 2.2 g/dL (ref 1.9–3.7)
Glucose, Bld: 94 mg/dL (ref 65–99)
Potassium: 4.7 mmol/L (ref 3.5–5.3)
Sodium: 141 mmol/L (ref 135–146)
Total Bilirubin: 0.5 mg/dL (ref 0.2–1.2)
Total Protein: 6.5 g/dL (ref 6.1–8.1)
eGFR: 72 mL/min/1.73m2

## 2024-06-16 LAB — CBC WITH DIFFERENTIAL/PLATELET
Absolute Lymphocytes: 1336 {cells}/uL (ref 850–3900)
Absolute Monocytes: 795 {cells}/uL (ref 200–950)
Basophils Absolute: 32 {cells}/uL (ref 0–200)
Basophils Relative: 0.3 %
Eosinophils Absolute: 95 {cells}/uL (ref 15–500)
Eosinophils Relative: 0.9 %
HCT: 38.4 % (ref 35.9–46.0)
Hemoglobin: 12.8 g/dL (ref 11.7–15.5)
MCH: 31 pg (ref 27.0–33.0)
MCHC: 33.3 g/dL (ref 31.6–35.4)
MCV: 93 fL (ref 81.4–101.7)
MPV: 9.4 fL (ref 7.5–12.5)
Monocytes Relative: 7.5 %
Neutro Abs: 8342 {cells}/uL — ABNORMAL HIGH (ref 1500–7800)
Neutrophils Relative %: 78.7 %
Platelets: 323 Thousand/uL (ref 140–400)
RBC: 4.13 Million/uL (ref 3.80–5.10)
RDW: 11.8 % (ref 11.0–15.0)
Total Lymphocyte: 12.6 %
WBC: 10.6 Thousand/uL (ref 3.8–10.8)

## 2024-06-16 MED ORDER — AMOXICILLIN-POT CLAVULANATE 875-125 MG PO TABS
1.0000 | ORAL_TABLET | Freq: Two times a day (BID) | ORAL | 0 refills | Status: AC
  Filled 2024-06-16: qty 20, 10d supply, fill #0

## 2024-06-16 NOTE — Progress Notes (Signed)
 "  Acute Office Visit  Subjective:  Patient ID: Brianna Carroll, female    DOB: 1961-05-19  Age: 63 y.o. MRN: [id number]  CC:  Chief Complaint  Patient presents with   Abdominal Pain      HPI Brianna Carroll is here for stomach pain, possible diverticulitis flare.    Discussed the use of AI scribe software for clinical note transcription with the patient, who gave verbal consent to proceed.  History of Present Illness Brianna Carroll is a 63 year old female with diverticulitis who presents with left lower abdominal pain.  She has been experiencing left lower abdominal pain for approximately three to four days. The pain is described as a 'crawling feeling' on the left side, similar to previous episodes of diverticulitis, although past pain has also occurred higher in the abdomen. Initially, she thought the pain was due to 'really bad gas' possibly related to food intake.  She has a history of diverticulitis with previous episodes successfully treated with antibiotics. She recalls an episode five or six years ago that involved an abscess and another in 2022 where treatment was initiated without a CT scan. Both instances resolved with antibiotics without further intervention.  For pain management, she has been alternating Tylenol . No diarrhea or constipation, but she has been using a stool softener as a precaution. She reports frequent urination but no burning during urination. She experienced a fever at the onset of symptoms and felt cold with a sensation of a hot head last night, although her husband noted she did not feel warm. She experienced nausea last night but no vomiting or blood in her stool.  Her recent diet included salads and poppy seed dressing, which she suspects may have contributed to her symptoms. She has been trying to follow a dietary plan and consumed wonton soup last night.       Past Medical History:  Diagnosis Date   Allergic rhinitis    Anemia, iron  deficiency    Anxiety    Asthma    Hearing loss    Heart murmur    Hyperlipidemia    Low back pain syndrome    Mitral valve prolapse    Sinus headache    Sleep apnea    wears CPAP   Snoring     Past Surgical History:  Procedure Laterality Date   BREAST REDUCTION SURGERY Bilateral 12/10/2018   Procedure: BILATERAL MAMMARY REDUCTION  (BREAST);  Surgeon: Leora Lenis, MD;  Location[facility] OR;  Service: Plastics;  Laterality: Bilateral;   CESAREAN SECTION  11/90, 12/95   HAMMER TOE SURGERY Right 04/29/2023   LUMBAR DISC SURGERY  2004   REDUCTION MAMMAPLASTY Bilateral 12/10/2018   TUBAL LIGATION  05/30/1994    Family History  Problem Relation Age of Onset   Cancer Mother        Lung Cancer   Cancer Father        Lung    COPD Sister    Cancer Brother        throat cancer-heavy smoker   Memory loss Paternal Grandmother    Asthma Son    Allergic rhinitis Son    Allergic rhinitis Son     Social History   Socioeconomic History   Marital status: Married    Spouse name: Selinda   Number of children: 2   Years of education: Not on file   Highest education level: Associate degree: academic program  Occupational History   Occupation: audiological scientist  Employer: [employer]    Comment: retired   Tobacco Use   Smoking status: Never   Smokeless tobacco: Never  Vaping Use   Vaping status: Never Used  Substance and Sexual Activity   Alcohol use: Not Currently    Comment: occ   Drug use: No   Sexual activity: Yes    Partners: Male    Birth control/protection: Surgical, Post-menopausal    Comment: BTL  Other Topics Concern   Not on file  Social History Narrative   Not on file   Social Drivers of Health   Tobacco Use: Low Risk (06/16/2024)   Patient History    Smoking Tobacco Use: Never    Smokeless Tobacco Use: Never    Passive Exposure: Not on file  Financial Resource Strain: Low Risk (01/05/2024)   Overall Financial Resource Strain (CARDIA)     Difficulty of Paying Living Expenses: Not hard at all  Food Insecurity: No Food Insecurity (01/05/2024)   Epic    Worried About Programme Researcher, Broadcasting/film/video in the Last Year: Never true    Ran Out of Food in the Last Year: Never true  Transportation Needs: Patient Declined (01/05/2024)   Epic    Lack of Transportation (Medical): Patient declined    Lack of Transportation (Non-Medical): Patient declined  Physical Activity: Insufficiently Active (01/05/2024)   Exercise Vital Sign    Days of Exercise per Week: 2 days    Minutes of Exercise per Session: 30 min  Stress: No Stress Concern Present (01/05/2024)   [employer] of Occupational Health - Occupational Stress Questionnaire    Feeling of Stress: Not at all  Social Connections: Unknown (01/05/2024)   Social Connection and Isolation Panel    Frequency of Communication with Friends and Family: Patient declined    Frequency of Social Gatherings with Friends and Family: Patient declined    Attends Religious Services: Patient declined    Active Member of Clubs or Organizations: Patient declined    Attends Banker Meetings: Not on file    Marital Status: Married  Intimate Partner Violence: Not on file  Depression (PHQ2-9): Low Risk (09/12/2023)   Depression (PHQ2-9)    PHQ-2 Score: 0  Alcohol Screen: Low Risk (01/05/2024)   Alcohol Screen    Last Alcohol Screening Score (AUDIT): 1  Housing: Unknown (01/05/2024)   Epic    Unable to Pay for Housing in the Last Year: Patient declined    Number of Times Moved in the Last Year: 0    Homeless in the Last Year: No  Utilities: Not on file  Health Literacy: Not on file    ROS All ROS negative except what is listed in the HPI.   Objective:   Today's Vitals: BP 136/62   Pulse 78   Temp 98 F (36.7 C) (Oral)   Ht 5' 1 (1.549 m)   Wt 165 lb (74.8 kg)   LMP 09/29/2013   SpO2 99%   BMI 31.18 kg/m   Physical Exam Vitals reviewed.  Constitutional:      Appearance: Normal  appearance.  Cardiovascular:     Rate and Rhythm: Normal rate and regular rhythm.     Heart sounds: Normal heart sounds.  Pulmonary:     Effort: Pulmonary effort is normal.     Breath sounds: Normal breath sounds.  Abdominal:     General: Abdomen is flat.     Palpations: Abdomen is soft. There is no mass.     Tenderness: There  is abdominal tenderness in the left lower quadrant. There is no right CVA tenderness, left CVA tenderness, guarding or rebound.  Skin:    General: Skin is warm and dry.  Neurological:     Mental Status: She is alert and oriented to person, place, and time.  Psychiatric:        Mood and Affect: Mood normal.        Behavior: Behavior normal.        Thought Content: Thought content normal.        Judgment: Judgment normal.        Assessment & Plan:   Problem List Items Addressed This Visit   None Visit Diagnoses       LLQ pain    -  Primary   Relevant Medications   amoxicillin -clavulanate (AUGMENTIN ) 875-125 MG tablet   Other Relevant Orders   CBC with Differential/Platelet   Comprehensive metabolic panel with GFR     Urinary frequency       Relevant Orders   POCT Urinalysis Dipstick (Automated) (Completed)   Urine Culture      Trace leuks, will send for culture Given history of diverticulitis and similar symptoms, she would like to try ABX and follow-up if not improving. Offered imaging, but she declined. Patient aware of signs/symptoms requiring further/urgent evaluation.  Follow-up with us  if not improving, go to the ED for any severe symptoms.  Recommend clear liquid diet or at least very gentle/bland until symptoms resolve.      Follow-up: Return if symptoms worsen or fail to improve.   Waddell FURY Almarie, DNP, FNP-C  I,Emily Lagle,acting as a neurosurgeon for Waddell KATHEE Almarie, NP.,have documented all relevant documentation on the behalf of Waddell KATHEE Almarie, NP.   I, Waddell KATHEE Almarie, NP, have reviewed all documentation for this visit. The  documentation on 06/16/2024 for the exam, diagnosis, procedures, and orders are all accurate and complete. "

## 2024-06-17 LAB — URINE CULTURE
MICRO NUMBER:: 17415157
Result:: NO GROWTH
SPECIMEN QUALITY:: ADEQUATE

## 2024-06-18 ENCOUNTER — Ambulatory Visit: Payer: Self-pay | Admitting: Family Medicine

## 2024-06-27 ENCOUNTER — Other Ambulatory Visit: Payer: Self-pay | Admitting: Family Medicine

## 2024-06-27 DIAGNOSIS — E785 Hyperlipidemia, unspecified: Secondary | ICD-10-CM

## 2024-07-02 ENCOUNTER — Telehealth: Admitting: Family Medicine

## 2024-07-02 DIAGNOSIS — R413 Other amnesia: Secondary | ICD-10-CM | POA: Diagnosis not present

## 2024-07-02 DIAGNOSIS — H9193 Unspecified hearing loss, bilateral: Secondary | ICD-10-CM | POA: Diagnosis not present

## 2024-07-02 DIAGNOSIS — H9313 Tinnitus, bilateral: Secondary | ICD-10-CM | POA: Diagnosis not present

## 2024-07-02 NOTE — Progress Notes (Signed)
 "   MyChart Video Visit    Virtual Visit via Video Note   This patient is at least at moderate risk for complications without adequate follow up. This format is felt to be most appropriate for this patient at this time. Physical exam was limited by quality of the video and audio technology used for the visit. Lolita was able to get the patient set up on a video visit.  Patient location: Home Patient and provider in visit Provider location: Office  I discussed the limitations of evaluation and management by telemedicine and the availability of in person appointments. The patient expressed understanding and agreed to proceed.  Visit Date: 07/02/2024  Today's healthcare provider: Jamee JONELLE Antonio Cyndee, DO     Subjective:    Patient ID: Brianna Carroll, female    DOB: 05-10-1961, 64 y.o.   MRN: [id number]  No chief complaint on file.   HPI Patient is in today for c/o worsening memory ,  and hearing / tinnitus.  Discussed the use of AI scribe software for clinical note transcription with the patient, who gave verbal consent to proceed.  History of Present Illness Brianna Carroll is a 64 year old female who presents with memory concerns and hearing loss.  She has ongoing memory concerns, with episodes of forgetfulness such as leaving a pot on the stove unattended and forgetting items while shopping despite making lists. She previously saw a specialist at [facility] for memory issues but did not pursue further evaluation due to discomfort with the process.  She has a history of sleep apnea and previously used a CPAP machine. After losing weight, her husband noted she stopped snoring, but after regaining some weight, the snoring returned. She is currently on a weight loss program and has lost four pounds since January 5th.  She uses Benadryl to aid sleep but is concerned about its impact on her memory. She is hesitant to stop due to potential sleep disturbances. She has tried melatonin in the  past without success.  She experiences significant hearing loss and tinnitus, which she describes as 'horrible' and 'so loud.' She has hearing aids but finds them uncomfortable due to a tingling sensation in her ears. Despite this, she acknowledges improved hearing when using them. The tinnitus is particularly bothersome at night, and she uses a fan for white noise, though it is not very effective.  No frequent repetition of stories. Occasional alcohol consumption, but not regularly.    Past Medical History:  Diagnosis Date   Allergic rhinitis    Anemia, iron deficiency    Anxiety    Asthma    Hearing loss    Heart murmur    Hyperlipidemia    Low back pain syndrome    Mitral valve prolapse    Sinus headache    Sleep apnea    wears CPAP   Snoring     Past Surgical History:  Procedure Laterality Date   BREAST REDUCTION SURGERY Bilateral 12/10/2018   Procedure: BILATERAL MAMMARY REDUCTION  (BREAST);  Surgeon: Leora Lenis, MD;  Location: [facility] OR;  Service: Plastics;  Laterality: Bilateral;   CESAREAN SECTION  11/90, 12/95   HAMMER TOE SURGERY Right 04/29/2023   LUMBAR DISC SURGERY  2004   REDUCTION MAMMAPLASTY Bilateral 12/10/2018   TUBAL LIGATION  05/30/1994    Family History  Problem Relation Age of Onset   Cancer Mother        Lung Cancer   Cancer Father  Lung    COPD Sister    Cancer Brother        throat cancer-heavy smoker   Memory loss Paternal Grandmother    Asthma Son    Allergic rhinitis Son    Allergic rhinitis Son     Social History   Socioeconomic History   Marital status: Married    Spouse name: Selinda   Number of children: 2   Years of education: Not on file   Highest education level: Associate degree: academic program  Occupational History   Occupation: Secretary/administrator: [employer]    Comment: retired   Tobacco Use   Smoking status: Never   Smokeless tobacco: Never  Vaping Use   Vaping status: Never Used   Substance and Sexual Activity   Alcohol use: Not Currently    Comment: occ   Drug use: No   Sexual activity: Yes    Partners: Male    Birth control/protection: Surgical, Post-menopausal    Comment: BTL  Other Topics Concern   Not on file  Social History Narrative   Not on file   Social Drivers of Health   Tobacco Use: Low Risk (06/16/2024)   Patient History    Smoking Tobacco Use: Never    Smokeless Tobacco Use: Never    Passive Exposure: Not on file  Financial Resource Strain: Low Risk (01/05/2024)   Overall Financial Resource Strain (CARDIA)    Difficulty of Paying Living Expenses: Not hard at all  Food Insecurity: No Food Insecurity (01/05/2024)   Epic    Worried About Programme Researcher, Broadcasting/film/video in the Last Year: Never true    Ran Out of Food in the Last Year: Never true  Transportation Needs: Patient Declined (01/05/2024)   Epic    Lack of Transportation (Medical): Patient declined    Lack of Transportation (Non-Medical): Patient declined  Physical Activity: Insufficiently Active (01/05/2024)   Exercise Vital Sign    Days of Exercise per Week: 2 days    Minutes of Exercise per Session: 30 min  Stress: No Stress Concern Present (01/05/2024)   [employer] of Occupational Health - Occupational Stress Questionnaire    Feeling of Stress: Not at all  Social Connections: Unknown (01/05/2024)   Social Connection and Isolation Panel    Frequency of Communication with Friends and Family: Patient declined    Frequency of Social Gatherings with Friends and Family: Patient declined    Attends Religious Services: Patient declined    Active Member of Clubs or Organizations: Patient declined    Attends Banker Meetings: Not on file    Marital Status: Married  Intimate Partner Violence: Not on file  Depression (PHQ2-9): Low Risk (09/12/2023)   Depression (PHQ2-9)    PHQ-2 Score: 0  Alcohol Screen: Low Risk (01/05/2024)   Alcohol Screen    Last Alcohol Screening Score  (AUDIT): 1  Housing: Unknown (01/05/2024)   Epic    Unable to Pay for Housing in the Last Year: Patient declined    Number of Times Moved in the Last Year: 0    Homeless in the Last Year: No  Utilities: Not on file  Health Literacy: Not on file    Outpatient Medications Prior to Visit  Medication Sig Dispense Refill   albuterol  (VENTOLIN  HFA) 108 (90 Base) MCG/ACT inhaler Inhale 2 puffs into the lungs every 6 (six) hours as needed for wheezing or shortness of breath. 8 g 5   azelastine  (ASTELIN ) 0.1 % nasal  spray 1-2 sprays each nostril up to 2 times daily as needed for drainage. 30 mL 12   Cholecalciferol (VITAMIN D ) 50 MCG (2000 UT) tablet Take 2,000 Units by mouth at bedtime.     diphenhydrAMINE (BENADRYL) 25 MG tablet Take 25 mg by mouth at bedtime as needed for sleep or allergies.     fenofibrate  (TRICOR ) 145 MG tablet Take 1 tablet (145 mg total) by mouth daily. 90 tablet 1   fluticasone  (FLONASE ) 50 MCG/ACT nasal spray Place 2 sprays into both nostrils daily. 16 g 5   loratadine  (CLARITIN ) 10 MG tablet Take 1 tablet (10 mg total) by mouth daily. 30 tablet 0   naproxen  (NAPROSYN ) 500 MG tablet TAKE 1 TABLET BY MOUTH TWICE DAILY AS NEEDED WITH FOOD FOR MODERATE PAIN 180 tablet 0   PARoxetine  (PAXIL ) 10 MG tablet Take 1 tablet (10 mg total) by mouth daily. TAKE 1 & 1/2 (ONE & ONE-HALF) TABLETS BY MOUTH ONCE DAILY 135 tablet 3   rosuvastatin  (CRESTOR ) 20 MG tablet TAKE 1 TABLET BY MOUTH AT BEDTIME 90 tablet 0   traZODone  (DESYREL ) 50 MG tablet Take 1 tablet (50 mg total) by mouth at bedtime as needed for sleep. 90 tablet 0   No facility-administered medications prior to visit.    Allergies[1]  Review of Systems  Constitutional:  Negative for fever and malaise/fatigue.  HENT:  Negative for congestion.   Eyes:  Negative for blurred vision.  Respiratory:  Negative for cough and shortness of breath.   Cardiovascular:  Negative for chest pain, palpitations and leg swelling.   Gastrointestinal:  Negative for vomiting.  Musculoskeletal:  Negative for back pain.  Skin:  Negative for rash.  Neurological:  Negative for loss of consciousness and headaches.       Objective:    Physical Exam Vitals and nursing note reviewed.  Constitutional:      Appearance: Normal appearance.  Neurological:     General: No focal deficit present.     Mental Status: She is alert and oriented to person, place, and time.  Psychiatric:        Mood and Affect: Mood normal.        Behavior: Behavior normal.        Thought Content: Thought content normal.        Judgment: Judgment normal.    LMP 09/29/2013  Wt Readings from Last 3 Encounters:  06/16/24 165 lb (74.8 kg)  01/06/24 153 lb (69.4 kg)  09/12/23 151 lb 6.4 oz (68.7 kg)       Assessment & Plan:  Bilateral hearing loss, unspecified hearing loss type -     Ambulatory referral to Neurology -     Ambulatory referral to Audiology  Memory loss -     Ambulatory referral to Neurology  Tinnitus of both ears -     Ambulatory referral to Neurology -     Ambulatory referral to Audiology   Assessment and Plan Assessment & Plan Memory loss   She experiences intermittent memory loss, forgetting tasks like cooking and grocery shopping. There are concerns about potential dementia due to memory issues, hearing loss, and sleep apnea. Benadryl use may contribute to cognitive impairment. Early signs of dementia, such as memory loss and repetitive storytelling, were discussed. Trazodone  is not advised due to potential memory effects. Alternative sleep aids like melatonin, chamomile, lavender, and magnesium gluconate were suggested. She is referred to neurology for further evaluation of memory loss and advised to discontinue Benadryl.  Bilateral hearing loss with tinnitus   She has bilateral hearing loss with significant tinnitus, described as loud ringing in the ears. Hearing aids provide some relief but are not consistently used  due to discomfort. Tinnitus worsens at night, affecting sleep quality. The association between hearing loss and dementia was discussed. She is referred to [facility] for audiology evaluation and management of hearing loss and tinnitus. Using white noise or a white noise app at night is recommended to help mask tinnitus.    I discussed the assessment and treatment plan with the patient. The patient was provided an opportunity to ask questions and all were answered. The patient agreed with the plan and demonstrated an understanding of the instructions.   The patient was advised to call back or seek an in-person evaluation if the symptoms worsen or if the condition fails to improve as anticipated.  Jamee JONELLE Antonio Cyndee, DO [facility] [phone] (phone) [phone] (fax)  [facility]      [1]  Allergies Allergen Reactions   Doxycycline Hives and Rash   Codeine Nausea And Vomiting   Hydrocodone Bit-Homatrop Mbr Nausea And Vomiting   Hydrocodone-Acetaminophen  Nausea And Vomiting   "

## 2024-07-15 ENCOUNTER — Other Ambulatory Visit: Payer: Self-pay | Admitting: Obstetrics & Gynecology

## 2024-07-15 DIAGNOSIS — Z1231 Encounter for screening mammogram for malignant neoplasm of breast: Secondary | ICD-10-CM

## 2024-07-27 ENCOUNTER — Ambulatory Visit
